# Patient Record
Sex: Male | Born: 1985
Health system: Southern US, Community
[De-identification: ages and names within clinical notes are randomized; demographics above are authoritative.]

## PROBLEM LIST (undated history)

## (undated) DIAGNOSIS — A523 Neurosyphilis, unspecified: Secondary | ICD-10-CM

## (undated) DIAGNOSIS — M545 Low back pain: Secondary | ICD-10-CM

## (undated) DIAGNOSIS — R0789 Other chest pain: Secondary | ICD-10-CM

## (undated) DIAGNOSIS — Z8619 Personal history of other infectious and parasitic diseases: Secondary | ICD-10-CM

## (undated) DIAGNOSIS — Z21 Asymptomatic human immunodeficiency virus [HIV] infection status: Secondary | ICD-10-CM

## (undated) DIAGNOSIS — B20 Human immunodeficiency virus [HIV] disease: Secondary | ICD-10-CM

## (undated) DIAGNOSIS — I10 Essential (primary) hypertension: Secondary | ICD-10-CM

## (undated) DIAGNOSIS — N185 Chronic kidney disease, stage 5: Secondary | ICD-10-CM

## (undated) DIAGNOSIS — K649 Unspecified hemorrhoids: Secondary | ICD-10-CM

## (undated) DIAGNOSIS — J40 Bronchitis, not specified as acute or chronic: Secondary | ICD-10-CM

## (undated) DIAGNOSIS — J45909 Unspecified asthma, uncomplicated: Secondary | ICD-10-CM

## (undated) DIAGNOSIS — Z9119 Patient's noncompliance with other medical treatment and regimen: Secondary | ICD-10-CM

## (undated) HISTORY — DX: Low back pain: M54.5

## (undated) HISTORY — DX: Personal history of other infectious and parasitic diseases: Z86.19

## (undated) HISTORY — DX: Patient's noncompliance with other medical treatment and regimen: Z91.19

## (undated) HISTORY — DX: Other chest pain: R07.89

## (undated) HISTORY — DX: Essential (primary) hypertension: I10

## (undated) HISTORY — DX: Chronic kidney disease, stage 5: N18.5

---
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
## 2009-11-11 ENCOUNTER — Emergency Department (HOSPITAL_COMMUNITY): Admission: EM | Admit: 2009-11-11 | Discharge: 2009-11-11 | Payer: Self-pay | Admitting: Emergency Medicine

## 2011-06-06 ENCOUNTER — Emergency Department (HOSPITAL_COMMUNITY)
Admission: EM | Admit: 2011-06-06 | Discharge: 2011-06-06 | Disposition: A | Discharge status: Home / Self Care | Payer: Self-pay | Attending: Emergency Medicine | Admitting: Emergency Medicine

## 2011-06-06 ENCOUNTER — Emergency Department (HOSPITAL_COMMUNITY): Payer: Self-pay

## 2011-06-06 DIAGNOSIS — S60219A Contusion of unspecified wrist, initial encounter: Secondary | ICD-10-CM | POA: Insufficient documentation

## 2011-06-06 DIAGNOSIS — S1093XA Contusion of unspecified part of neck, initial encounter: Secondary | ICD-10-CM | POA: Insufficient documentation

## 2011-06-06 DIAGNOSIS — M25539 Pain in unspecified wrist: Secondary | ICD-10-CM | POA: Insufficient documentation

## 2011-06-06 DIAGNOSIS — S40021A Contusion of right upper arm, initial encounter: Secondary | ICD-10-CM

## 2011-06-06 DIAGNOSIS — S40029A Contusion of unspecified upper arm, initial encounter: Secondary | ICD-10-CM | POA: Insufficient documentation

## 2011-06-06 DIAGNOSIS — M25519 Pain in unspecified shoulder: Secondary | ICD-10-CM | POA: Insufficient documentation

## 2011-06-06 DIAGNOSIS — M79609 Pain in unspecified limb: Secondary | ICD-10-CM | POA: Insufficient documentation

## 2011-06-06 DIAGNOSIS — R51 Headache: Secondary | ICD-10-CM | POA: Insufficient documentation

## 2011-06-06 DIAGNOSIS — S0003XA Contusion of scalp, initial encounter: Secondary | ICD-10-CM | POA: Insufficient documentation

## 2011-06-06 DIAGNOSIS — F17200 Nicotine dependence, unspecified, uncomplicated: Secondary | ICD-10-CM | POA: Insufficient documentation

## 2011-06-06 DIAGNOSIS — S60211A Contusion of right wrist, initial encounter: Secondary | ICD-10-CM

## 2011-06-06 DIAGNOSIS — W010XXA Fall on same level from slipping, tripping and stumbling without subsequent striking against object, initial encounter: Secondary | ICD-10-CM | POA: Insufficient documentation

## 2011-06-06 MED ORDER — IBUPROFEN 800 MG PO TABS
800.0000 mg | ORAL_TABLET | Freq: Once | ORAL | Status: AC
  Administered 2011-06-06: 800 mg via ORAL
  Filled 2011-06-06: qty 1

## 2011-06-06 NOTE — ED Notes (Signed)
Pt presents with right arm pain left wrist pain and head pain after falling 2 days ago off of stairs.

## 2011-06-06 NOTE — ED Provider Notes (Signed)
History     CSN: [id number]  Arrival date & time 06/06/11  1431   First MD Initiated Contact with Patient 06/06/11 1612      Chief Complaint  Patient presents with  . Arm Pain  . Wrist Pain  . Headache  . Fall    (Consider location/radiation/quality/duration/timing/severity/associated sxs/prior treatment) HPI Comments: Pt slipped and fell down several stairs injuring his R upper arm and wrist.  He also struck the L parietal scalp which hurt for a day but no longer hurting.  No LOC.  Patient is a 26 y.o. male presenting with arm pain, wrist pain, headaches, and fall. The history is provided by the patient.  Arm Pain This is a new problem. Episode onset: 2 days ago. The problem has been unchanged. Associated symptoms include headaches. The symptoms are aggravated by twisting (movement and palpation). He has tried NSAIDs for the symptoms. The treatment provided mild relief.  Wrist Pain Associated symptoms include headaches.  Headache  This is a new problem. The current episode started 2 days ago. The problem has been resolved. The pain is located in the parietal region. The patient is experiencing no pain. The pain does not radiate. He has tried NSAIDs for the symptoms. The treatment provided significant relief.  Fall Associated symptoms include headaches.    History reviewed. No pertinent past medical history.  History reviewed. No pertinent past surgical history.  No family history on file.  History  Substance Use Topics  . Smoking status: Current Everyday Smoker -- 0.5 packs/day  . Smokeless tobacco: Not on file  . Alcohol Use: Yes     occ      Review of Systems  Skin:       Humerus and wrist injuries.  Neurological: Positive for headaches.  All other systems reviewed and are negative.    Allergies  Review of patient's allergies indicates no known allergies.  Home Medications  No current outpatient prescriptions on file.  BP 118/80  Pulse 70  Temp(Src)  98.5 F (36.9 C) (Oral)  Resp 20  Ht 5\' 11"  (1.803 m)  Wt 160 lb (72.576 kg)  BMI 22.32 kg/m2  SpO2 100%  Physical Exam  Nursing note and vitals reviewed. Constitutional: He is oriented to person, place, and time. He appears well-developed and well-nourished. No distress.  HENT:  Head: Normocephalic and atraumatic.  Right Ear: External ear normal.  Left Ear: External ear normal.  Nose: Nose normal.  Eyes: EOM are normal.  Neck: Normal range of motion.  Cardiovascular: Normal rate, regular rhythm, normal heart sounds and intact distal pulses.   Pulmonary/Chest: Effort normal. No respiratory distress.  Abdominal: He exhibits no distension. There is no tenderness.  Musculoskeletal: He exhibits tenderness.       Right shoulder: He exhibits decreased range of motion, tenderness and pain. He exhibits no bony tenderness, no swelling, no effusion, no crepitus, no deformity, no laceration, no spasm, normal pulse and normal strength.       Right elbow: Normal.      Arms:      Pain with movement and palpation of R mid-humeral area and over R ulnar styloid.  Skin intact.  Neurological: He is alert and oriented to person, place, and time.  Skin: Skin is warm and dry. He is not diaphoretic.  Psychiatric: He has a normal mood and affect. Judgment normal.    ED Course  Procedures (including critical care time)  Labs Reviewed - No data to display Dg Wrist Complete Left  06/06/2011  *RADIOLOGY REPORT*  Clinical Data: [place].  Injured left wrist.  LEFT WRIST - COMPLETE 3+ VIEW  Comparison: None  Findings: The joint spaces are maintained.  No acute fracture.  IMPRESSION: No acute bony findings.  Original Report Authenticated By: P. Kalman Jewels, M.D.   Dg Humerus Right  06/06/2011  *RADIOLOGY REPORT*  Clinical Data: [place].  Right arm pain.  RIGHT HUMERUS - 2+ VIEW  Comparison: None  Findings: The shoulder and elbow joints are maintained.  No fracture of the humerus is identified.  IMPRESSION: No  acute bony findings.  Original Report Authenticated By: P. Kalman Jewels, M.D.     No diagnosis found.    [place], [place] 06/06/11 [phone]

## 2011-06-08 NOTE — ED Provider Notes (Signed)
Medical screening examination/treatment/procedure(s) were performed by non-physician practitioner and as supervising physician I was immediately available for consultation/collaboration.  Gypsy Balsam. Olin Hauser, MD 06/08/11 [phone]

## 2011-10-05 ENCOUNTER — Encounter (HOSPITAL_COMMUNITY): Payer: Self-pay | Admitting: *Deleted

## 2011-10-05 ENCOUNTER — Emergency Department (HOSPITAL_COMMUNITY)
Admission: EM | Admit: 2011-10-05 | Discharge: 2011-10-05 | Disposition: A | Discharge status: Home / Self Care | Payer: Self-pay | Attending: Emergency Medicine | Admitting: Emergency Medicine

## 2011-10-05 DIAGNOSIS — K5900 Constipation, unspecified: Secondary | ICD-10-CM | POA: Insufficient documentation

## 2011-10-05 DIAGNOSIS — R109 Unspecified abdominal pain: Secondary | ICD-10-CM | POA: Insufficient documentation

## 2011-10-05 DIAGNOSIS — F17200 Nicotine dependence, unspecified, uncomplicated: Secondary | ICD-10-CM | POA: Insufficient documentation

## 2011-10-05 DIAGNOSIS — A630 Anogenital (venereal) warts: Secondary | ICD-10-CM | POA: Insufficient documentation

## 2011-10-05 MED ORDER — HYDROCODONE-ACETAMINOPHEN 5-325 MG PO TABS: 1.0000 | ORAL_TABLET | Freq: Four times a day (QID) | ORAL | Status: AC | PRN

## 2011-10-05 MED ORDER — DOCUSATE SODIUM 100 MG PO CAPS: 100.0000 mg | ORAL_CAPSULE | Freq: Two times a day (BID) | ORAL | Status: AC

## 2011-10-05 NOTE — ED Notes (Signed)
Patient states he has had constipation since Saturday.  He states he took fiber on Sunday and did have a bm.  He states he is bloated and has rectal pain.  Patient has seen rectal bleeding when he is trying to have a bm

## 2011-10-05 NOTE — ED Provider Notes (Signed)
History     CSN: [id number]  Arrival date & time 10/05/11  1147   First MD Initiated Contact with Patient 10/05/11 1347      Chief Complaint  Patient presents with  . Constipation  . Abdominal Pain     HPI Pt states he has been having trouble with constipation.  He used a laxative over the weekend and had BM on Sunday.  He still feels like he is constipated and took another dose yesterday but it did not work.  He has not had any nausea or vomiting.  He has had persistent pain.  It is in the lower abdomen.  Nothing seems to make it better or worse.  Appetite has not been affected.  Pt does have discomfort in the anus.  . History reviewed. No pertinent past medical history.  History reviewed. No pertinent past surgical history.  No family history on file.  History  Substance Use Topics  . Smoking status: Current Everyday Smoker -- 0.5 packs/day  . Smokeless tobacco: Not on file  . Alcohol Use: Yes     occ      Review of Systems  All other systems reviewed and are negative.    Allergies  Review of patient's allergies indicates no known allergies.  Home Medications  No current outpatient prescriptions on file.  BP 117/80  Pulse 96  Temp(Src) 99 F (37.2 C) (Oral)  Resp 16  Ht $R'5\' 11"'hE$  (1.803 m)  Wt 170 lb (77.111 kg)  BMI 23.71 kg/m2  SpO2 98%  Physical Exam  Nursing note and vitals reviewed. Constitutional: He appears well-developed and well-nourished. No distress.  HENT:  Head: Normocephalic and atraumatic.  Right Ear: External ear normal.  Left Ear: External ear normal.  Eyes: Conjunctivae are normal. Right eye exhibits no discharge. Left eye exhibits no discharge. No scleral icterus.  Neck: Neck supple. No tracheal deviation present.  Cardiovascular: Normal rate, regular rhythm and intact distal pulses.   Pulmonary/Chest: Effort normal and breath sounds normal. No stridor. No respiratory distress. He has no wheezes. He has no rales.  Abdominal: Soft.  Bowel sounds are normal. He exhibits no distension. There is no tenderness. There is no rebound and no guarding.  Genitourinary:       Anal wart with tenderness, no bleeding  Musculoskeletal: He exhibits no edema and no tenderness.  Neurological: He is alert. He has normal strength. No sensory deficit. Cranial nerve deficit:  no gross defecits noted. He exhibits normal muscle tone. He displays no seizure activity. Coordination normal.  Skin: Skin is warm and dry. No rash noted.  Psychiatric: He has a normal mood and affect.    ED Course  Procedures (including critical care time)  Labs Reviewed - No data to display No results found.   1. Anal wart       MDM  I discussed the findings with the patient. He does admit to anal intercourse. I Have given a referral to GI and recommend seeing the health department for possible treatment.        Kathalene Frames, MD 10/05/11 [phone]

## 2011-10-05 NOTE — Discharge Instructions (Signed)
Genital Warts Genital warts are a sexually transmitted infection. They may appear as small bumps on the tissues of the genital area. CAUSES  Genital warts are caused by a virus called human papillomavirus (HPV). HPV is the most common sexually transmitted disease (STD) and infection of the sex organs. This infection is spread by having unprotected sex with an infected person. It can be spread by vaginal, anal, and oral sex. Many people do not know they are infected. They may be infected for years without problems. However, even if they do not have problems, they can unknowingly pass the infection to their sexual partners. SYMPTOMS   Itching and irritation in the genital area.   Warts that bleed.   Painful sexual intercourse.  DIAGNOSIS  Warts are usually recognized with the naked eye on the vagina, vulva, perineum, anus, and rectum. Certain tests can also diagnose genital warts, such as:  A Pap test.   A tissue sample (biopsy) exam.   Colposcopy. A magnifying tool is used to examine the vagina and cervix. The HPV cells will change color when certain solutions are used.  TREATMENT  Warts can be removed by:  Applying certain chemicals, such as cantharidin or podophyllin.   Liquid nitrogen freezing (cryotherapy).   Immunotherapy with candida or trichophyton injections.   Laser treatment.   Burning with an electrified probe (electrocautery).   Interferon injections.   Surgery.  PREVENTION  HPV vaccination can help prevent HPV infections that cause genital warts and that cause cancer of the cervix. It is recommended that the vaccination be given to people between the ages 48 to 72 years old. The vaccine might not work as well or might not work at all if you already have HPV. It should not be given to pregnant women. HOME CARE INSTRUCTIONS   It is important to follow your caregiver's instructions. The warts will not go away without treatment. Repeat treatments are often needed to get  rid of warts. Even after it appears that the warts are gone, the normal tissue underneath often remains infected.   Do not try to treat genital warts with medicine used to treat hand warts. This type of medicine is strong and can burn the skin in the genital area, causing more damage.   Tell your past and current sexual partner(s) that you have genital warts. They may be infected also and need treatment.   Avoid sexual contact while being treated.   Do not touch or scratch the warts. The infection may spread to other parts of your body.   Women with genital warts should have a cervical cancer check (Pap test) at least once a year. This type of cancer is slow-growing and can be cured if found early. Chances of developing cervical cancer are increased with HPV.   Inform your obstetrician about your warts in the event of pregnancy. This virus can be passed to the baby's respiratory tract. Discuss this with your caregiver.   Use a condom during sexual intercourse. Following treatment, the use of condoms will help prevent reinfection.   Ask your caregiver about using over-the-counter anti-itch creams.  SEEK MEDICAL CARE IF:   Your treated skin becomes red, swollen, or painful.   You have a fever.   You feel generally ill.   You feel little lumps in and around your genital area.   You are bleeding or have painful sexual intercourse.  MAKE SURE YOU:   Understand these instructions.   Will watch your condition.   Will  get help right away if you are not doing well or get worse.  Document Released: 05/14/2000 Document Revised: 05/06/2011 Document Reviewed: 11/23/2010 [facility] Patient Information 2012 [place]. RESOURCE GUIDE  Dental Problems  Patients with Medicaid: [facility] [place]                     [id number]                                           Phone:  [phone]                                                  If unable to pay or uninsured,  contact:  [facility] or [facility]. to become qualified for the adult dental clinic.  Chronic Pain Problems Contact [facility]  [phone] Patients need to be referred by their primary care doctor.  Insufficient Money for Medicine Contact [employer]:  call "211" or [place] [phone].  No Primary Care Doctor Call [facility]  [phone] Other agencies that provide inexpensive medical care    [place]  [phone]    [facility]  [place]  [phone]    [facility]  [phone]    [facility]  [phone]    [facility]  [phone]  Substance Abuse Resources Alcohol and Drug Services  [place] [phone] [facility] [phone] Residential & Outpatient Substance Abuse Program  [place]  [phone] [place]   [phone] (emergency services [phone])  Abuse/Neglect [place] [phone] [place] [phone] (After Hours)  Emergency [place] [phone]  [place] at the [place] [phone] [place] [phone]  MRSA Hotline #:   539-758-5952    [facility] Dept. [address]         [place] Phone:  [phone]                                  Phone:  [phone]  Phone:  [place] Phone:  [place] [phone] (After Hours)

## 2011-10-05 NOTE — ED Notes (Signed)
Pt was received to [facility] with c/o abdominal pain onset 4 days on and off characterized as cramping. Pt took a fiber packet yesterday and had a small bowel movement. Pt denies any N/V, no fever.

## 2013-02-08 ENCOUNTER — Encounter (HOSPITAL_COMMUNITY): Payer: Self-pay | Admitting: Emergency Medicine

## 2013-02-08 ENCOUNTER — Emergency Department (HOSPITAL_COMMUNITY)
Admission: EM | Admit: 2013-02-08 | Discharge: 2013-02-08 | Disposition: A | Discharge status: Home / Self Care | Payer: Self-pay | Attending: Emergency Medicine | Admitting: Emergency Medicine

## 2013-02-08 DIAGNOSIS — K644 Residual hemorrhoidal skin tags: Secondary | ICD-10-CM | POA: Insufficient documentation

## 2013-02-08 DIAGNOSIS — R197 Diarrhea, unspecified: Secondary | ICD-10-CM | POA: Insufficient documentation

## 2013-02-08 DIAGNOSIS — F17200 Nicotine dependence, unspecified, uncomplicated: Secondary | ICD-10-CM | POA: Insufficient documentation

## 2013-02-08 HISTORY — DX: Unspecified hemorrhoids: K64.9

## 2013-02-08 MED ORDER — HYDROCORTISONE 2.5 % RE CREA: TOPICAL_CREAM | RECTAL | Status: DC

## 2013-02-08 NOTE — ED Provider Notes (Signed)
Medical screening examination/treatment/procedure(s) were performed by non-physician practitioner and as supervising physician I was immediately available for consultation/collaboration.   Neta Ehlers, MD 02/08/13 2017

## 2013-02-08 NOTE — ED Provider Notes (Signed)
CSN: [id number]     Arrival date & time 02/08/13  8250 History   First MD Initiated Contact with Patient 02/08/13 [phone]     Chief Complaint  Patient presents with  . Rectal Pain   (Consider location/radiation/quality/duration/timing/severity/associated sxs/prior Treatment) The history is provided by the patient and medical records.   Pt presents to the ED via EMS for rectal pain since this morning.  Pt states he has had some non-bloody diarrhea since yesterday-- thinks he may have eaten some bad food.  Pt states he was feeling around his rectal area this morning and felt a "knot" which he thinks is an abscess.  Denies any drainage or active bleeding from the rectal area.  Pt does have a hx of non-thrombosed external hemorrhoids.  No recent fevers, sweats, or chills.  No abdominal pain, nausea, or vomiting.   Past Medical History  Diagnosis Date  . Hemorrhoids    No past surgical history on file. No family history on file. History  Substance Use Topics  . Smoking status: Current Every Day Smoker -- 0.20 packs/day  . Smokeless tobacco: Not on file  . Alcohol Use: Yes     Comment: occ    Review of Systems  Genitourinary:       Rectal pain  All other systems reviewed and are negative.    Allergies  Review of patient's allergies indicates no known allergies.  Home Medications  No current outpatient prescriptions on file. BP 125/72  Pulse 90  Temp(Src) 98.4 F (36.9 C) (Oral)  Resp 20  SpO2 94%  Physical Exam  Nursing note and vitals reviewed. Constitutional: He is oriented to person, place, and time. He appears well-developed and well-nourished. No distress.  HENT:  Head: Normocephalic and atraumatic.  Eyes: Conjunctivae and EOM are normal.  Neck: Normal range of motion. Neck supple.  Cardiovascular: Normal rate, regular rhythm and normal heart sounds.   Pulmonary/Chest: Effort normal and breath sounds normal. No respiratory distress. He has no wheezes.  Abdominal:  Soft. Bowel sounds are normal. There is no tenderness. There is no guarding.  Genitourinary: Rectal exam shows external hemorrhoid.  2 small, non-thrombosed, non-bleeding, external hemorrhoids; no surrounding erythema, swelling, induration, or abscess formation  Musculoskeletal: Normal range of motion.  Neurological: He is alert and oriented to person, place, and time.  Skin: Skin is warm and dry. He is not diaphoretic.  Psychiatric: He has a normal mood and affect.    ED Course  Procedures (including critical care time) Labs Review Labs Reviewed - No data to display Imaging Review No results found.  MDM   1. External hemorrhoids without mention of complication    Non-thrombosed, non-bleeding external hemorrhoids. No signs of peri-rectal abscess formation.  Rx anusol.  Discussed plan with pt, they agreed.  Return precautions advised.  Larene Pickett, PA-C 02/08/13 [phone]

## 2013-02-08 NOTE — ED Notes (Addendum)
Per EMS patient reports with rectal pain, patient is ambulating, c/o loose bowel movements. Per EMS patient states it "feels like an abscess," pain worsens with movement, no blood present. Patient states that he feels a mass around his anus.

## 2013-05-17 ENCOUNTER — Emergency Department (HOSPITAL_COMMUNITY): Payer: Self-pay

## 2013-05-17 ENCOUNTER — Encounter (HOSPITAL_COMMUNITY): Payer: Self-pay | Admitting: Emergency Medicine

## 2013-05-17 ENCOUNTER — Emergency Department (HOSPITAL_COMMUNITY)
Admission: EM | Admit: 2013-05-17 | Discharge: 2013-05-17 | Disposition: A | Discharge status: Home / Self Care | Payer: Self-pay | Attending: Emergency Medicine | Admitting: Emergency Medicine

## 2013-05-17 DIAGNOSIS — R509 Fever, unspecified: Secondary | ICD-10-CM | POA: Insufficient documentation

## 2013-05-17 DIAGNOSIS — R000 Tachycardia, unspecified: Secondary | ICD-10-CM | POA: Insufficient documentation

## 2013-05-17 DIAGNOSIS — R42 Dizziness and giddiness: Secondary | ICD-10-CM | POA: Insufficient documentation

## 2013-05-17 DIAGNOSIS — J40 Bronchitis, not specified as acute or chronic: Secondary | ICD-10-CM

## 2013-05-17 DIAGNOSIS — R062 Wheezing: Secondary | ICD-10-CM | POA: Insufficient documentation

## 2013-05-17 DIAGNOSIS — Z8709 Personal history of other diseases of the respiratory system: Secondary | ICD-10-CM | POA: Insufficient documentation

## 2013-05-17 DIAGNOSIS — R0602 Shortness of breath: Secondary | ICD-10-CM | POA: Insufficient documentation

## 2013-05-17 DIAGNOSIS — R0682 Tachypnea, not elsewhere classified: Secondary | ICD-10-CM | POA: Insufficient documentation

## 2013-05-17 DIAGNOSIS — Z8719 Personal history of other diseases of the digestive system: Secondary | ICD-10-CM | POA: Insufficient documentation

## 2013-05-17 DIAGNOSIS — J209 Acute bronchitis, unspecified: Secondary | ICD-10-CM | POA: Insufficient documentation

## 2013-05-17 DIAGNOSIS — R9431 Abnormal electrocardiogram [ECG] [EKG]: Secondary | ICD-10-CM | POA: Insufficient documentation

## 2013-05-17 DIAGNOSIS — M6281 Muscle weakness (generalized): Secondary | ICD-10-CM | POA: Insufficient documentation

## 2013-05-17 DIAGNOSIS — F17200 Nicotine dependence, unspecified, uncomplicated: Secondary | ICD-10-CM | POA: Insufficient documentation

## 2013-05-17 LAB — BASIC METABOLIC PANEL
CO2: 20 mEq/L (ref 19–32)
Chloride: 99 mEq/L (ref 96–112)
GFR calc non Af Amer: 73 mL/min — ABNORMAL LOW (ref >90)

## 2013-05-17 LAB — CBC
MCHC: 35.5 g/dL (ref 30.0–36.0)
MCV: 90.8 fL (ref 78.0–100.0)
Platelets: 238 10*3/uL (ref 150–400)
RBC: 5.77 MIL/uL (ref 4.22–5.81)
RDW: 14.7 % (ref 11.5–15.5)
WBC: 11.5 10*3/uL — ABNORMAL HIGH (ref 4.0–10.5)

## 2013-05-17 LAB — POCT I-STAT TROPONIN I: Troponin i, poc: 0.01 ng/mL (ref 0.00–0.08)

## 2013-05-17 MED ORDER — IBUPROFEN 200 MG PO TABS: 600.0000 mg | ORAL_TABLET | Freq: Once | ORAL | Status: DC

## 2013-05-17 MED ORDER — PREDNISONE 20 MG PO TABS
60.0000 mg | ORAL_TABLET | Freq: Once | ORAL | Status: AC
  Administered 2013-05-17: 60 mg via ORAL
  Filled 2013-05-17: qty 3

## 2013-05-17 MED ORDER — AZITHROMYCIN 250 MG PO TABS: 250.0000 mg | ORAL_TABLET | Freq: Every day | ORAL | Status: DC

## 2013-05-17 MED ORDER — AZITHROMYCIN 250 MG PO TABS
500.0000 mg | ORAL_TABLET | Freq: Once | ORAL | Status: AC
  Administered 2013-05-17: 500 mg via ORAL
  Filled 2013-05-17: qty 2

## 2013-05-17 MED ORDER — ALBUTEROL SULFATE HFA 108 (90 BASE) MCG/ACT IN AERS
1.0000 | INHALATION_SPRAY | Freq: Once | RESPIRATORY_TRACT | Status: AC
  Administered 2013-05-17: 2 via RESPIRATORY_TRACT
  Filled 2013-05-17: qty 6.7

## 2013-05-17 MED ORDER — PREDNISONE 50 MG PO TABS: 50.0000 mg | ORAL_TABLET | Freq: Every day | ORAL | Status: DC

## 2013-05-17 NOTE — ED Notes (Signed)
Pt states productive cough with white yellow phlegm, and substernal non radiating CP, causing SOB, chills, and diarrhea.

## 2013-05-17 NOTE — ED Notes (Signed)
Patient transported to X-ray 

## 2013-05-17 NOTE — ED Provider Notes (Signed)
CSN: [id number]     Arrival date & time 05/17/13  0120 History   First MD Initiated Contact with Patient 05/17/13 0244     Chief Complaint  Patient presents with  . Chest Pain   (Consider location/radiation/quality/duration/timing/severity/associated sxs/prior Treatment) HPI Comments: PT comes in with cc of cough, fevers, chest pain and chills. Pt started having sx y'day, and they have progressed overtime. Cough is producing yellow phlegm. He has midsternal chest pain, non radiating, which is worse with cough. The chest pain is not present at rest, and is not exertional. Pt has no nausea, emesis, no sick contacts and he is immunocompetent. Pt denies any myalgias. No runny nose, teary eyes, sore throat.  Patient is a 27 y.o. male presenting with chest pain. The history is provided by the patient and a friend.  Chest Pain Associated symptoms: cough, dizziness, fever, shortness of breath and weakness   Associated symptoms: no headache, no nausea and not vomiting     Past Medical History  Diagnosis Date  . Hemorrhoids    No past surgical history on file. No family history on file. History  Substance Use Topics  . Smoking status: Current Every Day Smoker -- 0.20 packs/day  . Smokeless tobacco: Not on file  . Alcohol Use: Yes     Comment: occ    Review of Systems  Constitutional: Positive for fever and chills. Negative for activity change.  Eyes: Negative for visual disturbance.  Respiratory: Positive for cough, shortness of breath and wheezing. Negative for chest tightness.   Cardiovascular: Positive for chest pain.  Gastrointestinal: Negative for nausea, vomiting and abdominal distention.  Genitourinary: Negative for dysuria, enuresis and difficulty urinating.  Musculoskeletal: Negative for arthralgias and neck pain.  Skin: Negative for rash.  Neurological: Positive for dizziness and weakness. Negative for light-headedness and headaches.  Hematological: Does not bruise/bleed  easily.  Psychiatric/Behavioral: Negative for confusion.    Allergies  Review of patient's allergies indicates no known allergies.  Home Medications   Current Outpatient Rx  Name  Route  Sig  Dispense  Refill  . azithromycin (ZITHROMAX) 250 MG tablet   Oral   Take 1 tablet (250 mg total) by mouth daily. 1 tab every day starting Thursday night   4 tablet   0   . predniSONE (DELTASONE) 50 MG tablet   Oral   Take 1 tablet (50 mg total) by mouth daily.   5 tablet   0    BP 120/63  Pulse 91  Temp(Src) 100.2 F (37.9 C) (Oral)  Resp 20  Ht $R'5\' 11"'ch$  (1.803 m)  Wt 179 lb (81.194 kg)  BMI 24.98 kg/m2  SpO2 94% Physical Exam  Nursing note and vitals reviewed. Constitutional: He is oriented to person, place, and time. He appears well-developed.  HENT:  Head: Normocephalic and atraumatic.  Eyes: Conjunctivae and EOM are normal. Pupils are equal, round, and reactive to light.  Neck: Normal range of motion. Neck supple.  Cardiovascular: Normal rate and regular rhythm.   Pulmonary/Chest: Effort normal. He has wheezes.  Right sided wheez and rhonchi  Abdominal: Soft. Bowel sounds are normal. He exhibits no distension. There is no tenderness. There is no rebound and no guarding.  Neurological: He is alert and oriented to person, place, and time.  Skin: Skin is warm. No rash noted.    ED Course  Procedures (including critical care time) Labs Review Labs Reviewed  CBC - Abnormal; Notable for the following:    WBC 11.5 (*)  Hemoglobin 18.6 (*)    HCT 52.4 (*)    All other components within normal limits  BASIC METABOLIC PANEL - Abnormal; Notable for the following:    Sodium 133 (*)    Glucose, Bld 126 (*)    GFR calc non Af Amer 73 (*)    GFR calc Af Amer 84 (*)    All other components within normal limits  POCT I-STAT TROPONIN I   Imaging Review Dg Chest 2 View  05/17/2013   CLINICAL DATA:  Cough, shortness of breath, fever  EXAM: CHEST  2 VIEW  COMPARISON:  None.   FINDINGS: The cardiac and mediastinal silhouettes are within normal limits.  The lungs are normally inflated. No airspace consolidation, pleural effusion, or pulmonary edema is identified. There is no pneumothorax. Mild diffuse prominence of the bronchiolar structures is present, suggestive of bronchiolitis.  No acute osseous abnormality identified.  IMPRESSION: Mild diffuse bronchitic changes, suggestive of bronchiolitis. No focal infiltrate to suggest bacterial pneumonia identified.   Electronically Signed   By: Jeannine Boga M.D.   On: 05/17/2013 01:50    EKG Interpretation    Date/Time:  Thursday May 17 2013 01:27:45 EST Ventricular Rate:  111 PR Interval:  144 QRS Duration: 94 QT Interval:  302 QTC Calculation: 410 R Axis:   -168 Text Interpretation:  Sinus tachycardia Biatrial enlargement Right superior axis deviation Pulmonary disease pattern Abnormal ECG Confirmed by Angelique Chevalier, MD, Earla Charlie ([id number]) on 05/17/2013 5:21:26 AM            MDM   1. Bronchitis    Pt comes in with cc of cough, chills, fevers, chest pain, dib. Troponin is negative. Pt is tachycardic, febrile, tachypneic - his O2 saturations are at 96% on room air for me. Lung exam did show some right sided consolidation, however, Xrays how no definite infiltrate. Possible flu, possible CAP that is missed on Xray or just bronchitis. If CAP - curb65 is 0 - and he is appropriate for outpatient tx. EKG is stable, troponin is neg, likely not myocarditis. If flu - immunocompetent - no need to get flu swabs right now. No myalgias, or URI like sx. We have given him and his friend good return precaution - and they are to return to the Er if the symptoms are not improving, since they don't have a pcp nor di they have insurance. We will give the albuterol inhaler from the ED.  Smoking cessation instruction/counseling given:  counseled patient on the dangers of tobacco use, advised patient to stop smoking, and reviewed  strategies to maximize success. Discussion was for 2-3 minutes     Varney Biles, MD 05/17/13 0532

## 2013-05-19 ENCOUNTER — Emergency Department (HOSPITAL_COMMUNITY)
Admission: EM | Admit: 2013-05-19 | Discharge: 2013-05-19 | Disposition: A | Discharge status: Home / Self Care | Payer: Self-pay | Attending: Emergency Medicine | Admitting: Emergency Medicine

## 2013-05-19 ENCOUNTER — Encounter (HOSPITAL_COMMUNITY): Payer: Self-pay | Admitting: Emergency Medicine

## 2013-05-19 ENCOUNTER — Emergency Department (HOSPITAL_COMMUNITY): Payer: Self-pay

## 2013-05-19 DIAGNOSIS — IMO0002 Reserved for concepts with insufficient information to code with codable children: Secondary | ICD-10-CM | POA: Insufficient documentation

## 2013-05-19 DIAGNOSIS — F17200 Nicotine dependence, unspecified, uncomplicated: Secondary | ICD-10-CM | POA: Insufficient documentation

## 2013-05-19 DIAGNOSIS — M549 Dorsalgia, unspecified: Secondary | ICD-10-CM | POA: Insufficient documentation

## 2013-05-19 DIAGNOSIS — J209 Acute bronchitis, unspecified: Secondary | ICD-10-CM | POA: Insufficient documentation

## 2013-05-19 DIAGNOSIS — J40 Bronchitis, not specified as acute or chronic: Secondary | ICD-10-CM

## 2013-05-19 DIAGNOSIS — Z8679 Personal history of other diseases of the circulatory system: Secondary | ICD-10-CM | POA: Insufficient documentation

## 2013-05-19 HISTORY — DX: Bronchitis, not specified as acute or chronic: J40

## 2013-05-19 LAB — COMPREHENSIVE METABOLIC PANEL
ALT: 23 U/L (ref 0–53)
AST: 39 U/L — ABNORMAL HIGH (ref 0–37)
Albumin: 3.1 g/dL — ABNORMAL LOW (ref 3.5–5.2)
Alkaline Phosphatase: 63 U/L (ref 39–117)
BUN: 14 mg/dL (ref 6–23)
Calcium: 8.4 mg/dL (ref 8.4–10.5)
Chloride: 101 mEq/L (ref 96–112)
Potassium: 3.8 mEq/L (ref 3.5–5.1)
Sodium: 134 mEq/L — ABNORMAL LOW (ref 135–145)
Total Bilirubin: 1.1 mg/dL (ref 0.3–1.2)
Total Protein: 7.8 g/dL (ref 6.0–8.3)

## 2013-05-19 LAB — CBC WITH DIFFERENTIAL/PLATELET
Basophils Absolute: 0.1 10*3/uL (ref 0.0–0.1)
Basophils Relative: 1 % (ref 0–1)
Eosinophils Absolute: 0 10*3/uL (ref 0.0–0.7)
Hemoglobin: 16.7 g/dL (ref 13.0–17.0)
Lymphocytes Relative: 50 % — ABNORMAL HIGH (ref 12–46)
MCH: 30.9 pg (ref 26.0–34.0)
MCHC: 34.5 g/dL (ref 30.0–36.0)
Monocytes Relative: 12 % (ref 3–12)
Neutrophils Relative %: 37 % — ABNORMAL LOW (ref 43–77)
Platelets: 206 10*3/uL (ref 150–400)
RDW: 14.1 % (ref 11.5–15.5)
WBC: 6.8 10*3/uL (ref 4.0–10.5)

## 2013-05-19 MED ORDER — OXYCODONE-ACETAMINOPHEN 5-325 MG PO TABS
1.0000 | ORAL_TABLET | Freq: Once | ORAL | Status: AC
  Administered 2013-05-19: 1 via ORAL
  Filled 2013-05-19: qty 1

## 2013-05-19 MED ORDER — DEXTROSE 5 % IV SOLN
500.0000 mg | Freq: Once | INTRAVENOUS | Status: AC
  Administered 2013-05-19: 500 mg via INTRAVENOUS

## 2013-05-19 MED ORDER — ALBUTEROL SULFATE (5 MG/ML) 0.5% IN NEBU
5.0000 mg | INHALATION_SOLUTION | Freq: Once | RESPIRATORY_TRACT | Status: AC
  Administered 2013-05-19: 5 mg via RESPIRATORY_TRACT
  Filled 2013-05-19: qty 1

## 2013-05-19 MED ORDER — ALBUTEROL SULFATE HFA 108 (90 BASE) MCG/ACT IN AERS: 1.0000 | INHALATION_SPRAY | Freq: Four times a day (QID) | RESPIRATORY_TRACT | Status: DC | PRN

## 2013-05-19 MED ORDER — DEXTROSE 5 % IV SOLN
1.0000 g | Freq: Once | INTRAVENOUS | Status: AC
  Administered 2013-05-19: 1 g via INTRAVENOUS
  Filled 2013-05-19: qty 10

## 2013-05-19 NOTE — ED Notes (Signed)
Patient's O2 Sat remained at 97% while ambulating down hallway.

## 2013-05-19 NOTE — ED Notes (Signed)
Pt. reports persistent dry cough with mid back pain and chest congestion  onset today , seen here 2 days ago diagnosed with Bronchitis , pt. did not fill prescription . No fever or chills.

## 2013-05-19 NOTE — ED Provider Notes (Signed)
CSN: [id number]     Arrival date & time 05/19/13  0335 History   First MD Initiated Contact with Patient 05/19/13 [phone]     Chief Complaint  Patient presents with  . Cough  . Back Pain   (Consider location/radiation/quality/duration/timing/severity/associated sxs/prior Treatment) HPI This patient is a generally healthy 27 year old man who was seen in this emergency department 2 days ago for productive cough. He had a chest x-ray which was unremarkable, he was diagnosed with bronchitis and prescribed azithromycin. He has not had prescription filled.  He is brought back to the emergency department by a friend because he feels like he is still feeling poorly and needs an extended note to excuse him from work. He has not had his antibiotics filled. He says he just got paid yesterday and will get the antibiotic filled today.  He denies fever. Continues to have a productive cough. He feels generally weak. His appetite and by mouth intake has been mildly diminished. However, he can tolerate fluids. She has some aching bilateral back pain which is worse after coughing. He denies chest pain.  Past Medical History  Diagnosis Date  . Hemorrhoids   . Bronchitis    History reviewed. No pertinent past surgical history. No family history on file. History  Substance Use Topics  . Smoking status: Current Every Day Smoker -- 0.20 packs/day  . Smokeless tobacco: Not on file  . Alcohol Use: Yes     Comment: occ    Review of Systems 10 point review of systems obtained and is negative with the exception of symptoms noted above.   Allergies  Review of patient's allergies indicates no known allergies.  Home Medications   Current Outpatient Rx  Name  Route  Sig  Dispense  Refill  . guaiFENesin (ROBITUSSIN) 100 MG/5ML liquid   Oral   Take 200 mg by mouth 3 (three) times daily as needed for cough.         Marland Kitchen azithromycin (ZITHROMAX) 250 MG tablet   Oral   Take 1 tablet (250 mg total) by mouth  daily. 1 tab every day starting Thursday night   4 tablet   0   . predniSONE (DELTASONE) 50 MG tablet   Oral   Take 1 tablet (50 mg total) by mouth daily.   5 tablet   0    BP 104/77  Pulse 83  Temp(Src) 98.3 F (36.8 C) (Oral)  Resp 20  SpO2 97% Physical Exam Gen: well developed and well nourished appearing Head: NCAT Eyes: PERL, EOMI Nose: no epistaixis or rhinorrhea Mouth/throat: mucosa is moist and pink Neck: supple, no stridor Lungs: CTA B, good air exchange, RR 20/min, scattered bilateral wheezing, rhonchi or rales. No accessory muscle use or retractions.  CV: RRR, no murmur, good extremity pulses  Abd: soft, notender, nondistended Back: no midline ttp Skin: warm and dry, no lesions Neuro: CN ii-xii grossly intact, no focal deficits, normal speech, normal gait.  Psyche; normal affect,  calm and cooperative.   ED Course  Procedures (including critical care time) Labs Review Results for orders placed during the hospital encounter of 05/19/13 (from the past 24 hour(s))  CBC WITH DIFFERENTIAL     Status: None   Collection Time    05/19/13  7:05 AM      Result Value Range   WBC 6.8  4.0 - 10.5 K/uL   RBC 5.40  4.22 - 5.81 MIL/uL   Hemoglobin 16.7  13.0 - 17.0 g/dL   HCT 48.4  39.0 - 52.0 %   MCV 89.6  78.0 - 100.0 fL   MCH 30.9  26.0 - 34.0 pg   MCHC 34.5  30.0 - 36.0 g/dL   RDW 14.1  11.5 - 15.5 %   Platelets 206  150 - 400 K/uL   Neutrophils Relative % PENDING  43 - 77 %   Neutro Abs PENDING  1.7 - 7.7 K/uL   Band Neutrophils PENDING  0 - 10 %   Lymphocytes Relative PENDING  12 - 46 %   Lymphs Abs PENDING  0.7 - 4.0 K/uL   Monocytes Relative PENDING  3 - 12 %   Monocytes Absolute PENDING  0.1 - 1.0 K/uL   Eosinophils Relative PENDING  0 - 5 %   Eosinophils Absolute PENDING  0.0 - 0.7 K/uL   Basophils Relative PENDING  0 - 1 %   Basophils Absolute PENDING  0.0 - 0.1 K/uL   WBC Morphology PENDING     RBC Morphology PENDING     Smear Review PENDING      nRBC PENDING  0 /100 WBC   Metamyelocytes Relative PENDING     Myelocytes PENDING     Promyelocytes Absolute PENDING     Blasts PENDING    COMPREHENSIVE METABOLIC PANEL     Status: Abnormal   Collection Time    05/19/13  7:05 AM      Result Value Range   Sodium 134 (*) 135 - 145 mEq/L   Potassium 3.8  3.5 - 5.1 mEq/L   Chloride 101  96 - 112 mEq/L   CO2 25  19 - 32 mEq/L   Glucose, Bld 89  70 - 99 mg/dL   BUN 14  6 - 23 mg/dL   Creatinine, Ser 1.11  0.50 - 1.35 mg/dL   Calcium 8.4  8.4 - 10.5 mg/dL   Total Protein 7.8  6.0 - 8.3 g/dL   Albumin 3.1 (*) 3.5 - 5.2 g/dL   AST 39 (*) 0 - 37 U/L   ALT 23  0 - 53 U/L   Alkaline Phosphatase 63  39 - 117 U/L   Total Bilirubin 1.1  0.3 - 1.2 mg/dL   GFR calc non Af Amer >90  >90 mL/min   GFR calc Af Amer >90  >90 mL/min    Imaging Review Dg Chest 2 View  05/19/2013   CLINICAL DATA:  Cough and back pain  EXAM: CHEST  2 VIEW  COMPARISON:  05/17/2013  FINDINGS: Normal heart size and mediastinal contours. There is dextroscoliosis of the thoracic spine. No effusion or pneumothorax. Mild diffuse bronchial thickening. No new opacity to suggest pneumonia.  IMPRESSION: 1. Stable exam. There bronchitic changes but no definitive pneumonia. 2. Thoracic dextroscoliosis.   Electronically Signed   By: Jorje Guild M.D.   On: 05/19/2013 06:02     MDM    Patient treated with Albuterol neb in ED. Looks and feels better. Ambulatory sats wnl. VS wnl. Stable for discharge with plan to fill prescriptions today. Referred to [facility] for outpatient f/u.   Elyn Peers, MD 05/19/13 [phone]

## 2013-05-19 NOTE — ED Notes (Signed)
Pt. States, "here on Thursday; prescribed tessalon pearls and ineffective; felt light headed at work, almost wanting to pass out."

## 2013-05-25 LAB — CULTURE, BLOOD (ROUTINE X 2): Culture: NO GROWTH

## 2013-11-16 ENCOUNTER — Emergency Department (HOSPITAL_COMMUNITY): Payer: Self-pay

## 2013-11-16 ENCOUNTER — Encounter (HOSPITAL_COMMUNITY): Payer: Self-pay | Admitting: Emergency Medicine

## 2013-11-16 ENCOUNTER — Emergency Department (HOSPITAL_COMMUNITY)
Admission: EM | Admit: 2013-11-16 | Discharge: 2013-11-16 | Disposition: A | Discharge status: Home / Self Care | Payer: Self-pay | Attending: Emergency Medicine | Admitting: Emergency Medicine

## 2013-11-16 DIAGNOSIS — Z8679 Personal history of other diseases of the circulatory system: Secondary | ICD-10-CM | POA: Insufficient documentation

## 2013-11-16 DIAGNOSIS — Z8709 Personal history of other diseases of the respiratory system: Secondary | ICD-10-CM | POA: Insufficient documentation

## 2013-11-16 DIAGNOSIS — K5289 Other specified noninfective gastroenteritis and colitis: Secondary | ICD-10-CM | POA: Insufficient documentation

## 2013-11-16 DIAGNOSIS — F17200 Nicotine dependence, unspecified, uncomplicated: Secondary | ICD-10-CM | POA: Insufficient documentation

## 2013-11-16 DIAGNOSIS — K529 Noninfective gastroenteritis and colitis, unspecified: Secondary | ICD-10-CM

## 2013-11-16 DIAGNOSIS — N390 Urinary tract infection, site not specified: Secondary | ICD-10-CM | POA: Insufficient documentation

## 2013-11-16 LAB — COMPREHENSIVE METABOLIC PANEL
ALK PHOS: 76 U/L (ref 39–117)
ALT: 29 U/L (ref 0–53)
AST: 30 U/L (ref 0–37)
Albumin: 3.1 g/dL — ABNORMAL LOW (ref 3.5–5.2)
BILIRUBIN TOTAL: 0.8 mg/dL (ref 0.3–1.2)
BUN: 10 mg/dL (ref 6–23)
CHLORIDE: 105 meq/L (ref 96–112)
CO2: 24 mEq/L (ref 19–32)
Calcium: 8.8 mg/dL (ref 8.4–10.5)
Creatinine, Ser: 1.05 mg/dL (ref 0.50–1.35)
GFR calc Af Amer: >90 mL/min (ref >90)
GFR calc non Af Amer: >90 mL/min (ref >90)
Glucose, Bld: 92 mg/dL (ref 70–99)
POTASSIUM: 3.7 meq/L (ref 3.7–5.3)
SODIUM: 140 meq/L (ref 137–147)
Total Protein: 7.9 g/dL (ref 6.0–8.3)

## 2013-11-16 LAB — URINALYSIS, ROUTINE W REFLEX MICROSCOPIC
BILIRUBIN URINE: NEGATIVE
GLUCOSE, UA: NEGATIVE mg/dL
Hgb urine dipstick: NEGATIVE
KETONES UR: 15 mg/dL — AB
Nitrite: NEGATIVE
Protein, ur: 30 mg/dL — AB
SPECIFIC GRAVITY, URINE: 1.033 — AB (ref 1.005–1.030)
Urobilinogen, UA: 1 mg/dL (ref 0.0–1.0)
pH: 7 (ref 5.0–8.0)

## 2013-11-16 LAB — CBC WITH DIFFERENTIAL/PLATELET
BASOS ABS: 0.1 10*3/uL (ref 0.0–0.1)
BASOS PCT: 1 % (ref 0–1)
EOS ABS: 0.5 10*3/uL (ref 0.0–0.7)
Eosinophils Relative: 9 % — ABNORMAL HIGH (ref 0–5)
HCT: 47.8 % (ref 39.0–52.0)
HEMOGLOBIN: 16.5 g/dL (ref 13.0–17.0)
LYMPHS PCT: 38 % (ref 12–46)
Lymphs Abs: 2.1 10*3/uL (ref 0.7–4.0)
MCH: 30.6 pg (ref 26.0–34.0)
MCHC: 34.5 g/dL (ref 30.0–36.0)
MCV: 88.5 fL (ref 78.0–100.0)
MONOS PCT: 10 % (ref 3–12)
Monocytes Absolute: 0.6 10*3/uL (ref 0.1–1.0)
NEUTROS ABS: 2.3 10*3/uL (ref 1.7–7.7)
NEUTROS PCT: 42 % — AB (ref 43–77)
Platelets: 272 10*3/uL (ref 150–400)
RBC: 5.4 MIL/uL (ref 4.22–5.81)
RDW: 14 % (ref 11.5–15.5)
WBC: 5.6 10*3/uL (ref 4.0–10.5)

## 2013-11-16 LAB — URINE MICROSCOPIC-ADD ON

## 2013-11-16 LAB — LIPASE, BLOOD: Lipase: 22 U/L (ref 11–59)

## 2013-11-16 MED ORDER — ONDANSETRON HCL 4 MG/2ML IJ SOLN
4.0000 mg | Freq: Once | INTRAMUSCULAR | Status: AC
  Administered 2013-11-16: 4 mg via INTRAVENOUS
  Filled 2013-11-16: qty 2

## 2013-11-16 MED ORDER — ONDANSETRON HCL 4 MG PO TABS: 4.0000 mg | ORAL_TABLET | Freq: Four times a day (QID) | ORAL | Status: DC

## 2013-11-16 MED ORDER — IOHEXOL 300 MG/ML  SOLN
100.0000 mL | Freq: Once | INTRAMUSCULAR | Status: AC | PRN
  Administered 2013-11-16: 100 mL via INTRAVENOUS

## 2013-11-16 MED ORDER — SODIUM CHLORIDE 0.9 % IV BOLUS (SEPSIS)
1000.0000 mL | Freq: Once | INTRAVENOUS | Status: AC
  Administered 2013-11-16: 1000 mL via INTRAVENOUS

## 2013-11-16 MED ORDER — CEPHALEXIN 500 MG PO CAPS: 500.0000 mg | ORAL_CAPSULE | Freq: Two times a day (BID) | ORAL | Status: DC

## 2013-11-16 MED ORDER — CEFTRIAXONE SODIUM 1 G IJ SOLR
1.0000 g | Freq: Once | INTRAMUSCULAR | Status: AC
  Administered 2013-11-16: 1 g via INTRAVENOUS
  Filled 2013-11-16: qty 10

## 2013-11-16 NOTE — ED Notes (Signed)
Notified PT that we would have to perform in & out cath if he couldn't produce a urine sample per MD orders

## 2013-11-16 NOTE — ED Notes (Signed)
Pt. Stated, I can't put a pain number on my head hurting

## 2013-11-16 NOTE — ED Notes (Signed)
PT states that every time he eats something he gets diarrhea which has been happening since this past Sunday. PT states diarrhea looks "weird" -green to light brown. PT states he has felt nauseous from smells a few times this week. Denies vomiting, fever, body aches

## 2013-11-16 NOTE — ED Notes (Signed)
Pt was playing a game and on his tablet .

## 2013-11-16 NOTE — ED Notes (Signed)
PT finished contrast. Called CT to let them know

## 2013-11-16 NOTE — ED Provider Notes (Signed)
CSN: [id number]     Arrival date & time 11/16/13  1239 History   First MD Initiated Contact with Patient 11/16/13 1313     Chief Complaint  Patient presents with  . Abdominal Pain     (Consider location/radiation/quality/duration/timing/severity/associated sxs/prior Treatment) The history is provided by the patient. No language interpreter was used.  Jonathon Moore is a 28 year-old male with past medical history of hemorrhoids and bronchitis presenting to the ED with abdominal pain that started on Sunday. As per patient, reported the abdominal pain is localized to the lower quadrants described as a cramping, aching sensation without radiation. Stated he's been having diarrhea intermittently since Sunday described as a loose stool with a brownish greenish tinge. Stated that he's been having nausea without episodes of emesis. Stated that he has been able to drink fluids that difficulty. Stated he currently works at [employer] and that he's been eating hamburger and french fries the past couple of days. Stated that on Saturday he drank something, unable to recall and believes that whatever he drank on Saturdays with leading to discomfort that started on Sunday. Denied melena, hematochezia, mucus in the stools, fever, chills, chest pain, shortness of breath, difficulty breathing. PCP none  Past Medical History  Diagnosis Date  . Hemorrhoids   . Bronchitis    History reviewed. No pertinent past surgical history. No family history on file. History  Substance Use Topics  . Smoking status: Current Every Day Smoker -- 0.20 packs/day  . Smokeless tobacco: Not on file  . Alcohol Use: Yes     Comment: occ    Review of Systems  Constitutional: Negative for fever and chills.  Respiratory: Negative for chest tightness and shortness of breath.   Cardiovascular: Negative for chest pain.  Gastrointestinal: Positive for nausea, abdominal pain and diarrhea. Negative for vomiting, constipation,  blood in stool and anal bleeding.  Genitourinary: Negative for dysuria, decreased urine volume, discharge, penile swelling and penile pain.  Musculoskeletal: Negative for back pain and neck pain.  Neurological: Negative for weakness.      Allergies  Review of patient's allergies indicates no known allergies.  Home Medications   Prior to Admission medications   Medication Sig Start Date End Date Taking? Authorizing Provider  acetaminophen (TYLENOL) 325 MG tablet Take 650 mg by mouth every 6 (six) hours as needed for moderate pain or headache.   Yes Historical Provider, MD  cephALEXin (KEFLEX) 500 MG capsule Take 1 capsule (500 mg total) by mouth 2 (two) times daily. 11/16/13   Aniayah Alaniz, PA-C  ondansetron (ZOFRAN) 4 MG tablet Take 1 tablet (4 mg total) by mouth every 6 (six) hours. 11/16/13   Genessis Flanary, PA-C   BP 120/73  Pulse 67  Temp(Src) 98.3 F (36.8 C) (Oral)  Resp 18  Ht $R'5\' 11"'dg$  (1.803 m)  Wt 170 lb (77.111 kg)  BMI 23.72 kg/m2  SpO2 95% Physical Exam  Nursing note and vitals reviewed. Constitutional: He is oriented to person, place, and time. He appears well-developed and well-nourished. No distress.  Patient laying comfortably in without any signs of respiratory distress  HENT:  Head: Normocephalic and atraumatic.  Mouth/Throat: Oropharynx is clear and moist. No oropharyngeal exudate.  Eyes: Conjunctivae and EOM are normal. Pupils are equal, round, and reactive to light.  Neck: Normal range of motion. Neck supple. No tracheal deviation present.  Cardiovascular: Normal rate, regular rhythm and normal heart sounds.  Exam reveals no friction rub.   No murmur heard. Pulses:  Radial pulses are 2+ on the right side, and 2+ on the left side.       Dorsalis pedis pulses are 2+ on the right side, and 2+ on the left side.  Pulmonary/Chest: Effort normal and breath sounds normal. No respiratory distress. He has no wheezes. He has no rales.  Abdominal: Soft. Bowel  sounds are normal. He exhibits no distension. There is tenderness. There is no rebound and no guarding.  Abdominal distention Bowel sounds normal active in all 4 quadrants Abdomen soft upon palpation with tenderness to the suprapubic and bilateral lower quadrants, right more so than left  Musculoskeletal: Normal range of motion.  Full ROM to upper and lower extremities without difficulty noted, negative ataxia noted.  Lymphadenopathy:    He has no cervical adenopathy.  Neurological: He is alert and oriented to person, place, and time. No cranial nerve deficit. He exhibits normal muscle tone. Coordination normal.  Skin: Skin is warm and dry. No rash noted. He is not diaphoretic. No erythema.  Psychiatric: He has a normal mood and affect. His behavior is normal. Thought content normal.    ED Course  Procedures (including critical care time)  Results for orders placed during the hospital encounter of 11/16/13  CBC WITH DIFFERENTIAL      Result Value Ref Range   WBC 5.6  4.0 - 10.5 K/uL   RBC 5.40  4.22 - 5.81 MIL/uL   Hemoglobin 16.5  13.0 - 17.0 g/dL   HCT 52.8  61.3 - 36.4 %   MCV 88.5  78.0 - 100.0 fL   MCH 30.6  26.0 - 34.0 pg   MCHC 34.5  30.0 - 36.0 g/dL   RDW 93.2  27.9 - 00.7 %   Platelets 272  150 - 400 K/uL   Neutrophils Relative % 42 (*) 43 - 77 %   Lymphocytes Relative 38  12 - 46 %   Monocytes Relative 10  3 - 12 %   Eosinophils Relative 9 (*) 0 - 5 %   Basophils Relative 1  0 - 1 %   Neutro Abs 2.3  1.7 - 7.7 K/uL   Lymphs Abs 2.1  0.7 - 4.0 K/uL   Monocytes Absolute 0.6  0.1 - 1.0 K/uL   Eosinophils Absolute 0.5  0.0 - 0.7 K/uL   Basophils Absolute 0.1  0.0 - 0.1 K/uL   WBC Morphology ATYPICAL LYMPHOCYTES    COMPREHENSIVE METABOLIC PANEL      Result Value Ref Range   Sodium 140  137 - 147 mEq/L   Potassium 3.7  3.7 - 5.3 mEq/L   Chloride 105  96 - 112 mEq/L   CO2 24  19 - 32 mEq/L   Glucose, Bld 92  70 - 99 mg/dL   BUN 10  6 - 23 mg/dL   Creatinine, Ser 5.40   0.50 - 1.35 mg/dL   Calcium 8.8  8.4 - 99.8 mg/dL   Total Protein 7.9  6.0 - 8.3 g/dL   Albumin 3.1 (*) 3.5 - 5.2 g/dL   AST 30  0 - 37 U/L   ALT 29  0 - 53 U/L   Alkaline Phosphatase 76  39 - 117 U/L   Total Bilirubin 0.8  0.3 - 1.2 mg/dL   GFR calc non Af Amer >90  >90 mL/min   GFR calc Af Amer >90  >90 mL/min  LIPASE, BLOOD      Result Value Ref Range   Lipase 22  11 -  59 U/L  URINALYSIS, ROUTINE W REFLEX MICROSCOPIC      Result Value Ref Range   Color, Urine AMBER (*) YELLOW   APPearance CLOUDY (*) CLEAR   Specific Gravity, Urine 1.033 (*) 1.005 - 1.030   pH 7.0  5.0 - 8.0   Glucose, UA NEGATIVE  NEGATIVE mg/dL   Hgb urine dipstick NEGATIVE  NEGATIVE   Bilirubin Urine NEGATIVE  NEGATIVE   Ketones, ur 15 (*) NEGATIVE mg/dL   Protein, ur 30 (*) NEGATIVE mg/dL   Urobilinogen, UA 1.0  0.0 - 1.0 mg/dL   Nitrite NEGATIVE  NEGATIVE   Leukocytes, UA MODERATE (*) NEGATIVE  URINE MICROSCOPIC-ADD ON      Result Value Ref Range   Squamous Epithelial / LPF RARE  RARE   WBC, UA 21-50  <3 WBC/hpf   RBC / HPF 0-2  <3 RBC/hpf   Bacteria, UA RARE  RARE   Casts HYALINE CASTS (*) NEGATIVE   Crystals CA OXALATE CRYSTALS (*) NEGATIVE   Urine-Other MUCOUS PRESENT      Labs Review Labs Reviewed  CBC WITH DIFFERENTIAL - Abnormal; Notable for the following:    Neutrophils Relative % 42 (*)    Eosinophils Relative 9 (*)    All other components within normal limits  COMPREHENSIVE METABOLIC PANEL - Abnormal; Notable for the following:    Albumin 3.1 (*)    All other components within normal limits  URINALYSIS, ROUTINE W REFLEX MICROSCOPIC - Abnormal; Notable for the following:    Color, Urine AMBER (*)    APPearance CLOUDY (*)    Specific Gravity, Urine 1.033 (*)    Ketones, ur 15 (*)    Protein, ur 30 (*)    Leukocytes, UA MODERATE (*)    All other components within normal limits  URINE MICROSCOPIC-ADD ON - Abnormal; Notable for the following:    Casts HYALINE CASTS (*)    Crystals  CA OXALATE CRYSTALS (*)    All other components within normal limits  URINE CULTURE  LIPASE, BLOOD    Imaging Review Ct Abdomen Pelvis W Contrast  11/16/2013   CLINICAL DATA:  28 year old male with right-sided abdominal and pelvic pain.  EXAM: CT ABDOMEN AND PELVIS WITH CONTRAST  TECHNIQUE: Multidetector CT imaging of the abdomen and pelvis was performed using the standard protocol following bolus administration of intravenous contrast.  CONTRAST:  119mL OMNIPAQUE IOHEXOL 300 MG/ML  SOLN  COMPARISON:  None.  FINDINGS: The liver, gallbladder, spleen, adrenal glands, pancreas and kidneys are unremarkable.  There is no evidence of free fluid, enlarged lymph nodes, biliary dilation or abdominal aortic aneurysm.  The bowel, bladder and appendix are unremarkable. There is no evidence of bowel obstruction, abscess or pneumoperitoneum.  A moderate apex left thoracolumbar scoliosis is noted. No acute bony abnormalities are present.  IMPRESSION: No acute abnormalities.  Thoracolumbar scoliosis.   Electronically Signed   By: Hassan Rowan M.D.   On: 11/16/2013 17:34     EKG Interpretation None      MDM   Final diagnoses:  UTI (lower urinary tract infection)  Gastroenteritis    Medications  sodium chloride 0.9 % bolus 1,000 mL (0 mLs Intravenous Stopped 11/16/13 1601)  ondansetron (ZOFRAN) injection 4 mg (4 mg Intravenous Given 11/16/13 1459)  iohexol (OMNIPAQUE) 300 MG/ML solution 100 mL (100 mLs Intravenous Contrast Given 11/16/13 1636)  cefTRIAXone (ROCEPHIN) 1 g in dextrose 5 % 50 mL IVPB (0 g Intravenous Stopped 11/16/13 1908)  sodium chloride 0.9 % bolus 1,000 mL (  1,000 mLs Intravenous New Bag/Given 11/16/13 1839)   Filed Vitals:   11/16/13 1500 11/16/13 1530 11/16/13 1545 11/16/13 1615  BP: 120/64 123/72 127/70 120/73  Pulse: 63 72 76 67  Temp:      TempSrc:      Resp:  18    Height:      Weight:      SpO2: 95% 99% 97% 95%   CBC negative elevated white blood cell count-negative left shift  or leukocytosis noted. CMP negative findings-kidney and liver functioning well. Negative elevation of liver enzymes. Negative elevated alkaline phosphatase. Negative elevated bilirubin. Electrolytes properly balanced. Lipase negative elevation. Urinalysis noted moderate leukocytes with white blood cell count of 21-50 with oxalate crystals identified. CT abdomen and pelvis with contrast negative for acute abnormalities. Liver, gallbladder, spleen, adrenal glands, pancreas and kidneys are unremarkable. Appendix unremarkable. Negative findings of bowel obstruction, abscess or pneumoperitoneum. Mild left thoracolumbar scoliosis noted. Urine culture pending. Doubt pancreatitis. Doubt appendicitis. Doubt pyelonephritis. Doubt acute abdominal processes. Patient presented to the ED with urinary tract infection, possible gastroenteritis. Patient given IV fluids and IV antibiotics while in ED setting. Patient tolerated fluids by mouth without episodes of emesis while in ED setting. Patient stable, afebrile. Patient not septic appearing. Negative acute abdominal processes identified on CT scan. Discharged patient. Discharged patient with antibiotics and Zofran. Discussed with patient to rest and stay hydrated. Discussed with patient proper diet. [facility]. Discussed with patient to closely monitor symptoms and if symptoms are to worsen or change to report back to the ED - strict return instructions given.  Patient agreed to plan of care, understood, all questions answered.   Jamse Mead, PA-C 11/16/13 2158

## 2013-11-16 NOTE — ED Notes (Signed)
Pt. Stated, Im over heated

## 2013-11-16 NOTE — ED Notes (Signed)
Pt. Stated, I've been having lower abd. Pain with some diarrhea since Sunday

## 2013-11-16 NOTE — ED Notes (Signed)
Pt was asked to give an urine sample. He stated that he couldn't give a sample right now

## 2013-11-16 NOTE — ED Notes (Signed)
Pt says he is having a lot of stomach pain (lower)

## 2013-11-16 NOTE — ED Notes (Signed)
Wrong documentation for heat exposure

## 2013-11-16 NOTE — Discharge Instructions (Signed)
Please call and set-up an appointment with [facility]  Please rest and stay hydrated Please take antibiotics as prescribed Please decrease intake of fat and greasy foods - please stick with a lite diet for the next couple of days Please drink plenty of water Please continue to monitor symptoms closely and if symptoms are to worsen or change (fever greater than 101, chills, sweating, nausea, vomiting, diarrhea, chest pain, shortness of breath, difficulty breathing, numbness, tingling, headache, dizziness, visual changes, blood in the stools, black tarry stools) please report back to the ED immediately   Urinary Tract Infection Urinary tract infections (UTIs) can develop anywhere along your urinary tract. Your urinary tract is your body's drainage system for removing wastes and extra water. Your urinary tract includes two kidneys, two ureters, a bladder, and a urethra. Your kidneys are a pair of bean-shaped organs. Each kidney is about the size of your fist. They are located below your ribs, one on each side of your spine. CAUSES Infections are caused by microbes, which are microscopic organisms, including fungi, viruses, and bacteria. These organisms are so small that they can only be seen through a microscope. Bacteria are the microbes that most commonly cause UTIs. SYMPTOMS  Symptoms of UTIs may vary by age and gender of the patient and by the location of the infection. Symptoms in young women typically include a frequent and intense urge to urinate and a painful, burning feeling in the bladder or urethra during urination. Older women and men are more likely to be tired, shaky, and weak and have muscle aches and abdominal pain. A fever may mean the infection is in your kidneys. Other symptoms of a kidney infection include pain in your back or sides below the ribs, nausea, and vomiting. DIAGNOSIS To diagnose a UTI, your caregiver will ask you about your symptoms. Your caregiver also  will ask to provide a urine sample. The urine sample will be tested for bacteria and white blood cells. White blood cells are made by your body to help fight infection. TREATMENT  Typically, UTIs can be treated with medication. Because most UTIs are caused by a bacterial infection, they usually can be treated with the use of antibiotics. The choice of antibiotic and length of treatment depend on your symptoms and the type of bacteria causing your infection. HOME CARE INSTRUCTIONS  If you were prescribed antibiotics, take them exactly as your caregiver instructs you. Finish the medication even if you feel better after you have only taken some of the medication.  Drink enough water and fluids to keep your urine clear or pale yellow.  Avoid caffeine, tea, and carbonated beverages. They tend to irritate your bladder.  Empty your bladder often. Avoid holding urine for long periods of time.  Empty your bladder before and after sexual intercourse.  After a bowel movement, women should cleanse from front to back. Use each tissue only once. SEEK MEDICAL CARE IF:   You have back pain.  You develop a fever.  Your symptoms do not begin to resolve within 3 days. SEEK IMMEDIATE MEDICAL CARE IF:   You have severe back pain or lower abdominal pain.  You develop chills.  You have nausea or vomiting.  You have continued burning or discomfort with urination. MAKE SURE YOU:   Understand these instructions.  Will watch your condition.  Will get help right away if you are not doing well or get worse. Document Released: 02/24/2005 Document Revised: 11/16/2011 Document Reviewed: 06/25/2011 ExitCare Patient  Information 2015 [place], [place]. This information is not intended to replace advice given to you by your health care provider. Make sure you discuss any questions you have with your health care provider.  Viral Gastroenteritis Viral gastroenteritis is also known as stomach flu. This condition  affects the stomach and intestinal tract. It can cause sudden diarrhea and vomiting. The illness typically lasts 3 to 8 days. Most people develop an immune response that eventually gets rid of the virus. While this natural response develops, the virus can make you quite ill. CAUSES  Many different viruses can cause gastroenteritis, such as rotavirus or noroviruses. You can catch one of these viruses by consuming contaminated food or water. You may also catch a virus by sharing utensils or other personal items with an infected person or by touching a contaminated surface. SYMPTOMS  The most common symptoms are diarrhea and vomiting. These problems can cause a severe loss of body fluids (dehydration) and a body salt (electrolyte) imbalance. Other symptoms may include:  Fever.  Headache.  Fatigue.  Abdominal pain. DIAGNOSIS  Your caregiver can usually diagnose viral gastroenteritis based on your symptoms and a physical exam. A stool sample may also be taken to test for the presence of viruses or other infections. TREATMENT  This illness typically goes away on its own. Treatments are aimed at rehydration. The most serious cases of viral gastroenteritis involve vomiting so severely that you are not able to keep fluids down. In these cases, fluids must be given through an intravenous line (IV). HOME CARE INSTRUCTIONS   Drink enough fluids to keep your urine clear or pale yellow. Drink small amounts of fluids frequently and increase the amounts as tolerated.  Ask your caregiver for specific rehydration instructions.  Avoid:  Foods high in sugar.  Alcohol.  Carbonated drinks.  Tobacco.  Juice.  Caffeine drinks.  Extremely hot or cold fluids.  Fatty, greasy foods.  Too much intake of anything at one time.  Dairy products until 24 to 48 hours after diarrhea stops.  You may consume probiotics. Probiotics are active cultures of beneficial bacteria. They may lessen the amount and  number of diarrheal stools in adults. Probiotics can be found in yogurt with active cultures and in supplements.  Wash your hands well to avoid spreading the virus.  Only take over-the-counter or prescription medicines for pain, discomfort, or fever as directed by your caregiver. Do not give aspirin to children. Antidiarrheal medicines are not recommended.  Ask your caregiver if you should continue to take your regular prescribed and over-the-counter medicines.  Keep all follow-up appointments as directed by your caregiver. SEEK IMMEDIATE MEDICAL CARE IF:   You are unable to keep fluids down.  You do not urinate at least once every 6 to 8 hours.  You develop shortness of breath.  You notice blood in your stool or vomit. This may look like coffee grounds.  You have abdominal pain that increases or is concentrated in one small area (localized).  You have persistent vomiting or diarrhea.  You have a fever.  The patient is a child younger than 3 months, and he or she has a fever.  The patient is a child older than 3 months, and he or she has a fever and persistent symptoms.  The patient is a child older than 3 months, and he or she has a fever and symptoms suddenly get worse.  The patient is a baby, and he or she has no tears when crying. MAKE SURE  YOU:   Understand these instructions.  Will watch your condition.  Will get help right away if you are not doing well or get worse. Document Released: 05/17/2005 Document Revised: 08/09/2011 Document Reviewed: 03/03/2011 [facility] Patient Information 2015 [place], [place]. This information is not intended to replace advice given to you by your health care provider. Make sure you discuss any questions you have with your health care provider.   Emergency Department Resource Guide 1) Find a Doctor and Pay Out of Pocket Although you won't have to find out who is covered by your insurance plan, it is a good idea to ask around and get  recommendations. You will then need to call the office and see if the doctor you have chosen will accept you as a new patient and what types of options they offer for patients who are self-pay. Some doctors offer discounts or will set up payment plans for their patients who do not have insurance, but you will need to ask so you aren't surprised when you get to your appointment.  2) Contact Your Local Health Department Not all health departments have doctors that can see patients for sick visits, but many do, so it is worth a call to see if yours does. If you don't know where your local health department is, you can check in your phone book. The CDC also has a tool to help you locate your state's health department, and many state websites also have listings of all of their local health departments.  3) Find a [facility] If your illness is not likely to be very severe or complicated, you may want to try a walk in clinic. These are popping up all over the country in pharmacies, drugstores, and shopping centers. They're usually staffed by nurse practitioners or physician assistants that have been trained to treat common illnesses and complaints. They're usually fairly quick and inexpensive. However, if you have serious medical issues or chronic medical problems, these are probably not your best option.  No Primary Care Doctor: - Call Health Connect at  [phone] - they can help you locate a primary care doctor that  accepts your insurance, provides certain services, etc. - Physician Referral Service- [phone]  Chronic Pain Problems: Organization         Address  Phone   Notes  [facility]  [phone] Patients need to be referred by their primary care doctor.   Medication Assistance: Organization         Address  Phone   Notes  [facility] Medication [facility] [address] [place] [place], [place] [address] [phone] --Must be a resident of  [facility] -- Must have NO insurance coverage whatsoever (no Medicaid/ Medicare, etc.) -- The pt. MUST have a primary care doctor that directs their care regularly and follows them in the community   MedAssist  [phone]   [employer]  [phone]    Agencies that provide inexpensive medical care: Organization         Address  Phone   Notes  [place]  [phone]   [facility]    [phone]   [facility] [place], [place] [address] [phone]   [place] [phone]   Planned Parenthood    [phone]   [facility]    [phone]   Community Health and  [address], [place] Phone:  [phone], Fax:  [phone] Hours of Operation:  9 am - 6 pm, M-F.  Also accepts Medicaid/Medicare and self-pay.  [facility], [address], [place] Phone: [phone], Fax: [phone]. Hours of Operation:  8:30 am - 5:30 pm, M-F.  Also accepts Medicaid and self-pay.  [facility] [place] [address] [place] Phone: [phone]   [place], [place], [place] [phone], Ext. [phone] Mondays & Thursdays: 7-9 AM.  First 15 patients are seen on a first come, first serve basis.    [place] Providers:  Organization         Address  Phone   Notes  [facility] [address], [place] [phone] Also accepts self-pay patients.  [facility] [address], [place]  [phone]   [place], [address], [place] [phone]   [facility] [address] [place] [phone]   Lucianne Lei [address], [place]   [phone] Only accepts [place] Access  [place] patients after they have their name applied to their card.   Self-Pay (no insurance) in [facility]:  Organization         Address  Phone   Notes  Sickle Cell Patients, [facility] Internal Medicine [place] [phone]   [facility] Urgent Care [place] [phone]   [facility] [place]  [place], [place], [place] [phone]   [facility]/Dr. Osei-Bonsu  [address] [place] or [address], [place] [phone] Phone number for both [place] and [place] locations is the same.  Urgent Medical and [facility] [address] [place] [phone]   [facility] [address] [place] or [address] [phone]   [facility] [address] [place] [phone], phone; [phone], fax Sees patients 1st and 3rd Saturday of every month.  Must not qualify for public or private insurance (i.e. Medicaid, Medicare, [place] Health Choice, Veterans' Benefits)  Household income should be no more than 200% of the poverty level The clinic cannot treat you if you are pregnant or think you are pregnant  Sexually transmitted diseases are not treated at the clinic.    Dental Care: Organization         Address  Phone  Notes  [facility] Department of [facility] [phone] Accepts children up to age 77 who are enrolled in [place] or [place]; pregnant women with a Medicaid card; and children who have applied for Medicaid or [place] Health Choice, but were declined, whose parents can pay a reduced fee at time of service.  [facility] Department of [facility]  [address], [place] [phone] Accepts children up to age 49 who are enrolled in [place] or [place]; pregnant women with a Medicaid  card; and children who have applied for Medicaid or [place] Health Choice, but were declined, whose parents can pay a reduced fee at time of service.  [place] Adult Dental Access PROGRAM  [place],  [place] [phone] Patients are seen by appointment only. Walk-ins are not accepted. [place] will see patients 53 years of age and older. Monday - Tuesday (8am-5pm) Most Wednesdays (8:30-5pm) $30 per visit, cash only  [facility] Adult Dental Access PROGRAM  [address], [place] [phone] Patients are seen by appointment only. Walk-ins are not accepted. [place] will see patients 37 years of age and older. One Wednesday Evening (Monthly: Volunteer Based).  $30 per visit, cash only  [place]  [phone] for adults; Children under age 60, call Graduate Pediatric Dentistry at [phone]. Children aged 21-14, please call [phone] to request a pediatric application.  Dental services are provided in all areas of dental care including fillings, crowns and bridges, complete and partial dentures, implants, gum treatment, root canals, and extractions. Preventive care is also provided. Treatment is provided to both adults and children. Patients are selected via a lottery and there is often a waiting list.   [facility] [address] [place]  [phone] [email or web address]   [facility] [address] [place], [place] [phone], Ext. [phone] Second and Fourth Thursday of each month, opens at 6:30 AM; Clinic ends at 9 AM.  Patients are seen on a first-come first-served basis, and a limited number are seen during each clinic.   [facility]  [address] [place], [place] [phone]   Eligibility Requirements You must have lived in [place], [place], or [place] counties for at least the last three months.   You cannot be eligible for state or federal sponsored [employer],  including [employer], [place], or [employer].   You generally cannot be eligible for healthcare insurance through your employer.    How to apply: Eligibility screenings are held every Tuesday and Wednesday afternoon from 1:00 pm until 4:00 pm. You do not need an appointment for the interview!  [facility] [address] [place], [place]   [place]  [place] Department  [place]  [phone]    Behavioral Health Resources in the Community: Intensive Outpatient Programs Organization         Address  Phone  Notes  [place]. [address], [place], [place] [phone]   [facility] Outpatient [address] [place], [place]   ADS: Alcohol & Drug Svcs [address], [place], [place]   [place] [address]  [place], [place] or [phone]   Substance Abuse Resources Organization         Address  Phone  Notes  Alcohol and Drug Services  [phone]   [place]  [phone]   The [place]   Chinita Pester  [phone]   Residential & Outpatient Substance Abuse Program  [phone]   Psychological Services Organization         Address  Phone  Notes  [facility] [place]  [place]  [phone]   [place] [address], [place] or [phone]    Mobile Crisis Teams Organization         Address  Phone  Notes  [facility], Mobile Crisis Care Unit  [phone]   [facility]  [address] [place], [place]   [place] [address] [place] [phone]    Self-Help/Support Groups Organization         Address  Phone             Notes  Mental Health Assoc.  of [place] - variety of support groups  Echo Call for more information  Narcotics Anonymous (NA), [facility]aring [facility] [address], [employer] [place]  2 meetings at this location   Special educational needs teacher         Address  Phone  Notes  [facility]idential Treatment [place],    [place]  [phone]   [facility]  [address], [place] [phone], [place], [place]   [place]o [place], [place] [phone] Admissions: 8am-3pm M-F  Incentives Substance [place] [address],    [place], [place] [phone]   [facility] [address] [place], [place], [place]   [facility] [address]  [place], [place]   [facility] Programs - Intensive Outpatient [address], [place], [place]   [facility] [address] [place] [address] [place].,  [place], [place] [phone] or [phone]   [facility] Services (RTS) [address] [place], [place] Accepts Medicaid  [facility] [place] [address]  [place] [phone] Substance Abuse/Addiction Treatment   [facility] Organization         Address  Phone  Notes  [facility]  [phone]   Domenic Schwab, PhD [address] Arlis [place], [place]   [phone] or [phone]   [facility] [place], [place] [phone]   [facility] [address] [place], [place] [phone] Insurance/Medicaid/sponsorship through [facility] and Families [address] [place]                                    [place], [place] [phone] [place] [address], [place] [phone]    Dr. Adele Schilder  [phone]   [facility] [place] Dept. 1) [address], [place] 2)  [place] 3)  [address], [place] [phone]  [phone]   [place] [phone] or [phone] (After Hours)

## 2013-11-17 NOTE — ED Provider Notes (Signed)
Medical screening examination/treatment/procedure(s) were performed by non-physician practitioner and as supervising physician I was immediately available for consultation/collaboration.   EKG Interpretation None        Blanchie Dessert, MD 11/17/13 1302

## 2013-11-18 LAB — URINE CULTURE
Colony Count: 3000
Special Requests: NORMAL

## 2013-11-20 NOTE — Discharge Planning (Signed)
[facility] CL was not able to see patient, GCCN orange card information and primary care resources will be sent to the address provided in EPIC.

## 2013-12-10 ENCOUNTER — Emergency Department (HOSPITAL_COMMUNITY)
Admission: EM | Admit: 2013-12-10 | Discharge: 2013-12-10 | Disposition: A | Discharge status: Home / Self Care | Payer: Self-pay | Attending: Emergency Medicine | Admitting: Emergency Medicine

## 2013-12-10 ENCOUNTER — Encounter (HOSPITAL_COMMUNITY): Payer: Self-pay | Admitting: Emergency Medicine

## 2013-12-10 DIAGNOSIS — R112 Nausea with vomiting, unspecified: Secondary | ICD-10-CM | POA: Insufficient documentation

## 2013-12-10 DIAGNOSIS — F17200 Nicotine dependence, unspecified, uncomplicated: Secondary | ICD-10-CM | POA: Insufficient documentation

## 2013-12-10 DIAGNOSIS — Z8709 Personal history of other diseases of the respiratory system: Secondary | ICD-10-CM | POA: Insufficient documentation

## 2013-12-10 DIAGNOSIS — Z8679 Personal history of other diseases of the circulatory system: Secondary | ICD-10-CM | POA: Insufficient documentation

## 2013-12-10 DIAGNOSIS — R197 Diarrhea, unspecified: Secondary | ICD-10-CM | POA: Insufficient documentation

## 2013-12-10 DIAGNOSIS — R109 Unspecified abdominal pain: Secondary | ICD-10-CM | POA: Insufficient documentation

## 2013-12-10 LAB — CBC WITH DIFFERENTIAL/PLATELET
BASOS ABS: 0.1 10*3/uL (ref 0.0–0.1)
Basophils Relative: 1 % (ref 0–1)
EOS PCT: 11 % — AB (ref 0–5)
Eosinophils Absolute: 0.6 10*3/uL (ref 0.0–0.7)
HEMATOCRIT: 48.4 % (ref 39.0–52.0)
HEMOGLOBIN: 16.9 g/dL (ref 13.0–17.0)
LYMPHS PCT: 46 % (ref 12–46)
Lymphs Abs: 2.4 10*3/uL (ref 0.7–4.0)
MCH: 31.3 pg (ref 26.0–34.0)
MCHC: 34.9 g/dL (ref 30.0–36.0)
MCV: 89.6 fL (ref 78.0–100.0)
Monocytes Absolute: 0.6 10*3/uL (ref 0.1–1.0)
Monocytes Relative: 11 % (ref 3–12)
Neutro Abs: 1.7 10*3/uL (ref 1.7–7.7)
Neutrophils Relative %: 31 % — ABNORMAL LOW (ref 43–77)
Platelets: 264 10*3/uL (ref 150–400)
RBC: 5.4 MIL/uL (ref 4.22–5.81)
RDW: 14.4 % (ref 11.5–15.5)
WBC: 5.4 10*3/uL (ref 4.0–10.5)

## 2013-12-10 LAB — COMPREHENSIVE METABOLIC PANEL
ALBUMIN: 3.6 g/dL (ref 3.5–5.2)
ALK PHOS: 77 U/L (ref 39–117)
ALT: 29 U/L (ref 0–53)
AST: 27 U/L (ref 0–37)
Anion gap: 11 (ref 5–15)
BILIRUBIN TOTAL: 1 mg/dL (ref 0.3–1.2)
BUN: 8 mg/dL (ref 6–23)
CHLORIDE: 104 meq/L (ref 96–112)
CO2: 25 meq/L (ref 19–32)
Calcium: 9 mg/dL (ref 8.4–10.5)
Creatinine, Ser: 1.06 mg/dL (ref 0.50–1.35)
GFR calc Af Amer: >90 mL/min (ref >90)
GFR calc non Af Amer: >90 mL/min (ref >90)
Glucose, Bld: 74 mg/dL (ref 70–99)
POTASSIUM: 4.4 meq/L (ref 3.7–5.3)
Sodium: 140 mEq/L (ref 137–147)
Total Protein: 8.4 g/dL — ABNORMAL HIGH (ref 6.0–8.3)

## 2013-12-10 LAB — LIPASE, BLOOD: Lipase: 18 U/L (ref 11–59)

## 2013-12-10 MED ORDER — ONDANSETRON 4 MG PO TBDP: ORAL_TABLET | ORAL | Status: DC

## 2013-12-10 NOTE — Discharge Instructions (Signed)
If you were given medicines take as directed.  If you are on coumadin or contraceptives realize their levels and effectiveness is altered by many different medicines.  If you have any reaction (rash, tongues swelling, other) to the medicines stop taking and see a physician.   Please follow up as directed and return to the ER or see a physician for new or worsening symptoms.  Thank you. Filed Vitals:   12/10/13 1508 12/10/13 1720 12/10/13 1830  BP: 115/80 121/78 135/87  Pulse: 69 58 64  Temp: 98.3 F (36.8 C)    TempSrc: Oral    Resp: 18 18   Height: $Remove'5\' 11"'NCILlnW$  (1.803 m)    Weight: 170 lb (77.111 kg)    SpO2: 97% 96% 99%

## 2013-12-10 NOTE — ED Notes (Addendum)
Main lab states they will add lipase on.

## 2013-12-10 NOTE — ED Notes (Signed)
Pt given ginger ale, per Dr. Reather Converse.

## 2013-12-10 NOTE — ED Notes (Signed)
Presents with diarrhea x 4 today and 7 yesterday as well as nausea. Reports abdominal pain after eating, usually after eating salad. HX of gastroenteritis and this feels the same.  Denies blood in stool. Denies fevers. Abdomen soft, non tender.

## 2013-12-10 NOTE — ED Provider Notes (Signed)
CSN: [id number]     Arrival date & time 12/10/13  1436 History   First MD Initiated Contact with Patient 12/10/13 1809     Chief Complaint  Patient presents with  . Nausea  . Diarrhea     (Consider location/radiation/quality/duration/timing/severity/associated sxs/prior Treatment) HPI Comments: 28 year old male with history of alcohol or smoking abuse presents with recurrent vomiting and diarrhea.  Patient had diarrhea nonbloody today for episodes and 7 yesterday with nausea however no vomiting today. Patient tolerating fluids without difficulty today. No history of GI issues, recent antibiotics or fevers or chills. No recent travel. Patient has mild epigastric pain after eating all types of food. Patient drink alcohol yesterday.  Patient is a 28 y.o. male presenting with diarrhea. The history is provided by the patient.  Diarrhea Associated symptoms: abdominal pain and vomiting   Associated symptoms: no chills, no fever and no headaches     Past Medical History  Diagnosis Date  . Hemorrhoids   . Bronchitis    History reviewed. No pertinent past surgical history. History reviewed. No pertinent family history. History  Substance Use Topics  . Smoking status: Current Every Day Smoker -- 0.20 packs/day  . Smokeless tobacco: Not on file  . Alcohol Use: Yes     Comment: occ    Review of Systems  Constitutional: Positive for appetite change. Negative for fever and chills.  HENT: Negative for congestion.   Eyes: Negative for visual disturbance.  Respiratory: Negative for shortness of breath.   Cardiovascular: Negative for chest pain.  Gastrointestinal: Positive for vomiting, abdominal pain and diarrhea.  Genitourinary: Negative for dysuria and flank pain.  Musculoskeletal: Negative for back pain, neck pain and neck stiffness.  Skin: Negative for rash.  Neurological: Negative for light-headedness and headaches.      Allergies  Review of patient's allergies indicates no known  allergies.  Home Medications   Prior to Admission medications   Medication Sig Start Date End Date Taking? Authorizing Provider  ondansetron (ZOFRAN ODT) 4 MG disintegrating tablet 4mg  ODT q4 hours prn nausea/vomit 12/10/13   Mariea Clonts, MD   BP 135/87  Pulse 64  Temp(Src) 98.3 F (36.8 C) (Oral)  Resp 18  Ht 5\' 11"  (1.803 m)  Wt 170 lb (77.111 kg)  BMI 23.72 kg/m2  SpO2 99% Physical Exam  Nursing note and vitals reviewed. Constitutional: He is oriented to person, place, and time. He appears well-developed and well-nourished.  HENT:  Head: Normocephalic and atraumatic.  Eyes: Conjunctivae are normal. Right eye exhibits no discharge. Left eye exhibits no discharge.  Neck: Normal range of motion. Neck supple. No tracheal deviation present.  Cardiovascular: Normal rate and regular rhythm.   Pulmonary/Chest: Effort normal and breath sounds normal.  Abdominal: Soft. He exhibits no distension. There is no tenderness. There is no guarding.  Musculoskeletal: He exhibits no edema.  Neurological: He is alert and oriented to person, place, and time.  Skin: Skin is warm. No rash noted.  Psychiatric: He has a normal mood and affect.    ED Course  Procedures (including critical care time)  EMERGENCY DEPARTMENT BILIARY ULTRASOUND INTERPRETATION "Study: Limited Abdominal Ultrasound of the gallbladder and common bile duct."  INDICATIONS: Abdominal pain, Nausea and Vomiting Indication: Multiple views of the gallbladder and common bile duct were obtained in real-time with a Multi-frequency probe." PERFORMED BY:  Myself IMAGES ARCHIVED?: Yes FINDINGS: Gallstones absent, Gallbladder wall normal in thickness and Sonographic Murphy's sign absent LIMITATIONS: Bowel Gas INTERPRETATION: Normal   Labs Review  Labs Reviewed  CBC WITH DIFFERENTIAL - Abnormal; Notable for the following:    Neutrophils Relative % 31 (*)    Eosinophils Relative 11 (*)    All other components within normal  limits  COMPREHENSIVE METABOLIC PANEL - Abnormal; Notable for the following:    Total Protein 8.4 (*)    All other components within normal limits  LIPASE, BLOOD    Imaging Review No results found.   EKG Interpretation None      MDM   Final diagnoses:  Nausea vomiting and diarrhea   Well-appearing male with no current abdominal pain. Discussed differential gastroenteritis, ulcers, biliary. Blood work unremarkable patient well-appearing in ER. Reasons to return given. Patient tolerated oral in ER.  Bedside US unremarkable.   Results and differential diagnosis were discussed with the patient/parent/guardian. Close follow up outpatient was discussed, comfortable with the plan.   Medications - No data to display  Filed Vitals:   12/10/13 1508 12/10/13 1720 12/10/13 1830  BP: 115/80 121/78 135/87  Pulse: 69 58 64  Temp: 98.3 F (36.8 C)    TempSrc: Oral    Resp: 18 18   Height: $Remove'5\' 11"'[id number]$  (1.803 m)    Weight: 170 lb (77.111 kg)    SpO2: 97% 96% 99%        Mariea Clonts, MD 12/11/13 [phone]

## 2015-03-14 ENCOUNTER — Emergency Department (INDEPENDENT_AMBULATORY_CARE_PROVIDER_SITE_OTHER)
Admission: EM | Admit: 2015-03-14 | Discharge: 2015-03-14 | Disposition: A | Discharge status: Home / Self Care | Payer: Self-pay | Source: Home / Self Care

## 2015-03-14 ENCOUNTER — Encounter (HOSPITAL_COMMUNITY): Payer: Self-pay | Admitting: *Deleted

## 2015-03-14 DIAGNOSIS — H109 Unspecified conjunctivitis: Secondary | ICD-10-CM

## 2015-03-14 MED ORDER — POLYMYXIN B-TRIMETHOPRIM 10000-0.1 UNIT/ML-% OP SOLN: 2.0000 [drp] | Freq: Four times a day (QID) | OPHTHALMIC | Status: DC

## 2015-03-14 MED ORDER — FLUORESCEIN SODIUM 1 MG OP STRP
ORAL_STRIP | OPHTHALMIC | Status: AC
  Filled 2015-03-14: qty 1

## 2015-03-14 MED ORDER — TETRACAINE HCL 0.5 % OP SOLN
OPHTHALMIC | Status: AC
Start: 2015-03-14 — End: 2015-03-14
  Filled 2015-03-14: qty 2

## 2015-03-14 NOTE — ED Provider Notes (Signed)
CSN: [id number]     Arrival date & time 03/14/15  1844 History   None    Chief Complaint  Patient presents with  . Conjunctivitis   (Consider location/radiation/quality/duration/timing/severity/associated sxs/prior Treatment) Patient is a 29 y.o. male presenting with conjunctivitis.  Conjunctivitis This is a new problem. The current episode started yesterday. The problem has been gradually worsening. Pertinent negatives include no chest pain, no abdominal pain, no headaches and no shortness of breath.  Denies sick contact.   Past Medical History  Diagnosis Date  . Hemorrhoids   . Bronchitis    History reviewed. No pertinent past surgical history. No family history on file. Social History  Substance Use Topics  . Smoking status: Current Every Day Smoker -- 0.20 packs/day  . Smokeless tobacco: None  . Alcohol Use: Yes     Comment: weekly    Review of Systems  Eyes: Positive for discharge (copious, purulent) and redness. Negative for photophobia, pain, itching and visual disturbance.  Respiratory: Negative for shortness of breath.   Cardiovascular: Negative for chest pain.  Gastrointestinal: Negative for abdominal pain.  Neurological: Negative for headaches.    Allergies  Review of patient's allergies indicates no known allergies.  Home Medications   Prior to Admission medications   Medication Sig Start Date End Date Taking? Authorizing Provider  ondansetron (ZOFRAN ODT) 4 MG disintegrating tablet 4mg  ODT q4 hours prn nausea/vomit 12/10/13   Elnora Morrison, MD  trimethoprim-polymyxin b (POLYTRIM) ophthalmic solution Place 2 drops into the right eye every 6 (six) hours. 03/14/15   Tereasa Coop, PA-C   Meds Ordered and Administered this Visit  Medications - No data to display  BP 139/80 mmHg  Pulse 82  Temp(Src) 98.2 F (36.8 C) (Oral)  Resp 16  SpO2 96% No data found.   Physical Exam  Eyes: EOM are normal. Pupils are equal, round, and reactive to light. Right  eye exhibits discharge and exudate. Right conjunctiva is injected. Right conjunctiva has no hemorrhage.  Cardiovascular: Normal rate.   Pulmonary/Chest: Effort normal.  Nursing note and vitals reviewed.   ED Course  Procedures (including critical care time)  Labs Review Labs Reviewed - No data to display  Imaging Review No results found.        MDM   1. Conjunctivitis of right eye    Likely bacterial.  Will treat with Polytrim.      Tereasa Coop, PA-C 03/14/15 [phone]

## 2015-03-14 NOTE — ED Notes (Addendum)
C/O right eye redness, swelling, purulent discharge since yesterday.  Denies vision changes.  Does not wear contact lenses.  Supposed to wear glasses - has not for 10 yrs.  Strongly encouraged pt to have eye exam for glasses.

## 2015-03-14 NOTE — Discharge Instructions (Signed)

## 2015-03-25 ENCOUNTER — Encounter (HOSPITAL_COMMUNITY): Payer: Self-pay | Admitting: *Deleted

## 2015-03-25 ENCOUNTER — Emergency Department (HOSPITAL_COMMUNITY)
Admission: EM | Admit: 2015-03-25 | Discharge: 2015-03-25 | Disposition: A | Discharge status: Home / Self Care | Payer: Self-pay | Attending: Emergency Medicine | Admitting: Emergency Medicine

## 2015-03-25 ENCOUNTER — Emergency Department (HOSPITAL_COMMUNITY): Payer: Self-pay

## 2015-03-25 DIAGNOSIS — R0602 Shortness of breath: Secondary | ICD-10-CM | POA: Insufficient documentation

## 2015-03-25 DIAGNOSIS — Z8719 Personal history of other diseases of the digestive system: Secondary | ICD-10-CM | POA: Insufficient documentation

## 2015-03-25 DIAGNOSIS — Z8679 Personal history of other diseases of the circulatory system: Secondary | ICD-10-CM | POA: Insufficient documentation

## 2015-03-25 DIAGNOSIS — R062 Wheezing: Secondary | ICD-10-CM | POA: Insufficient documentation

## 2015-03-25 DIAGNOSIS — Z720 Tobacco use: Secondary | ICD-10-CM | POA: Insufficient documentation

## 2015-03-25 LAB — CBC WITH DIFFERENTIAL/PLATELET
BASOS ABS: 0.1 10*3/uL (ref 0.0–0.1)
BASOS PCT: 1 %
Eosinophils Absolute: 1 10*3/uL — ABNORMAL HIGH (ref 0.0–0.7)
Eosinophils Relative: 15 %
HEMATOCRIT: 44.5 % (ref 39.0–52.0)
HEMOGLOBIN: 15.2 g/dL (ref 13.0–17.0)
Lymphocytes Relative: 41 %
Lymphs Abs: 2.7 10*3/uL (ref 0.7–4.0)
MCH: 31.3 pg (ref 26.0–34.0)
MCHC: 34.2 g/dL (ref 30.0–36.0)
MCV: 91.8 fL (ref 78.0–100.0)
MONOS PCT: 8 %
Monocytes Absolute: 0.5 10*3/uL (ref 0.1–1.0)
NEUTROS ABS: 2.2 10*3/uL (ref 1.7–7.7)
NEUTROS PCT: 35 %
Platelets: 243 10*3/uL (ref 150–400)
RBC: 4.85 MIL/uL (ref 4.22–5.81)
RDW: 14.2 % (ref 11.5–15.5)
WBC: 6.5 10*3/uL (ref 4.0–10.5)

## 2015-03-25 LAB — COMPREHENSIVE METABOLIC PANEL
ALBUMIN: 3 g/dL — AB (ref 3.5–5.0)
ALK PHOS: 81 U/L (ref 38–126)
ALT: 22 U/L (ref 17–63)
AST: 30 U/L (ref 15–41)
Anion gap: 5 (ref 5–15)
BILIRUBIN TOTAL: 1.3 mg/dL — AB (ref 0.3–1.2)
BUN: 6 mg/dL (ref 6–20)
CALCIUM: 8.8 mg/dL — AB (ref 8.9–10.3)
CO2: 30 mmol/L (ref 22–32)
CREATININE: 1.08 mg/dL (ref 0.61–1.24)
Chloride: 104 mmol/L (ref 101–111)
GFR calc Af Amer: >60 mL/min (ref >60)
GFR calc non Af Amer: >60 mL/min (ref >60)
GLUCOSE: 68 mg/dL (ref 65–99)
Potassium: 3.9 mmol/L (ref 3.5–5.1)
Sodium: 139 mmol/L (ref 135–145)
TOTAL PROTEIN: 8.1 g/dL (ref 6.5–8.1)

## 2015-03-25 MED ORDER — PREDNISONE 20 MG PO TABS
50.0000 mg | ORAL_TABLET | Freq: Once | ORAL | Status: AC
  Administered 2015-03-25: 50 mg via ORAL
  Filled 2015-03-25: qty 3

## 2015-03-25 MED ORDER — IPRATROPIUM-ALBUTEROL 0.5-2.5 (3) MG/3ML IN SOLN
3.0000 mL | Freq: Once | RESPIRATORY_TRACT | Status: AC
  Administered 2015-03-25: 3 mL via RESPIRATORY_TRACT
  Filled 2015-03-25: qty 3

## 2015-03-25 MED ORDER — ALBUTEROL SULFATE HFA 108 (90 BASE) MCG/ACT IN AERS
1.0000 | INHALATION_SPRAY | RESPIRATORY_TRACT | Status: DC | PRN
  Filled 2015-03-25: qty 6.7

## 2015-03-25 MED ORDER — PREDNISONE 50 MG PO TABS: ORAL_TABLET | ORAL | Status: DC

## 2015-03-25 NOTE — ED Notes (Signed)
Pt ambulated 100+ feet in hallway. Pt states he has to try to breath harder towards end of ambulating, but pt doesn't want to say "difficulty breathing". PT's o2 saturation dropped from 95% to 88% during ambulation. Pt's pulse stayed in the 70's during ambulation.

## 2015-03-25 NOTE — ED Notes (Signed)
Patient transported to X-ray 
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
## 2015-03-25 NOTE — ED Notes (Signed)
PT placed in gown and in bed. Pt monitored by pulse ox, bp cuff, and 12-lead. 

## 2015-03-25 NOTE — Discharge Instructions (Signed)
-   Call [facility] or another primary care clinic in the resource guide to schedule a follow up appointment for early next week - Use albuterol inhaler as needed for shortness of breath or wheezing. Rest at home and refrain from strenuous activity for the next few days - Return to ED with new or worsening symptoms   Shortness of Breath Shortness of breath means you have trouble breathing. It could also mean that you have a medical problem. You should get immediate medical care for shortness of breath. CAUSES   Not enough oxygen in the air such as with high altitudes or a smoke-filled room.  Certain lung diseases, infections, or problems.  Heart disease or conditions, such as angina or heart failure.  Low red blood cells (anemia).  Poor physical fitness, which can cause shortness of breath when you exercise.  Chest or back injuries or stiffness.  Being overweight.  Smoking.  Anxiety, which can make you feel like you are not getting enough air. DIAGNOSIS  Serious medical problems can often be found during your physical exam. Tests may also be done to determine why you are having shortness of breath. Tests may include:  Chest X-rays.  Lung function tests.  Blood tests.  An electrocardiogram (ECG).  An ambulatory electrocardiogram. An ambulatory ECG records your heartbeat patterns over a 24-hour period.  Exercise testing.  A transthoracic echocardiogram (TTE). During echocardiography, sound waves are used to evaluate how blood flows through your heart.  A transesophageal echocardiogram (TEE).  Imaging scans. Your health care provider may not be able to find a cause for your shortness of breath after your exam. In this case, it is important to have a follow-up exam with your health care provider as directed.  TREATMENT  Treatment for shortness of breath depends on the cause of your symptoms and can vary greatly. HOME CARE INSTRUCTIONS   Do not smoke.  Smoking is a common cause of shortness of breath. If you smoke, ask for help to quit.  Avoid being around chemicals or things that may bother your breathing, such as paint fumes and dust.  Rest as needed. Slowly resume your usual activities.  If medicines were prescribed, take them as directed for the full length of time directed. This includes oxygen and any inhaled medicines.  Keep all follow-up appointments as directed by your health care provider. SEEK MEDICAL CARE IF:   Your condition does not improve in the time expected.  You have a hard time doing your normal activities even with rest.  You have any new symptoms. SEEK IMMEDIATE MEDICAL CARE IF:   Your shortness of breath gets worse.  You feel light-headed, faint, or develop a cough not controlled with medicines.  You start coughing up blood.  You have pain with breathing.  You have chest pain or pain in your arms, shoulders, or abdomen.  You have a fever.  You are unable to walk up stairs or exercise the way you normally do. MAKE SURE YOU:  Understand these instructions.  Will watch your condition.  Will get help right away if you are not doing well or get worse.   This information is not intended to replace advice given to you by your health care provider. Make sure you discuss any questions you have with your health care provider.   Document Released: 02/09/2001 Document Revised: 05/22/2013 Document Reviewed: 08/02/2011 Elsevier Interactive Patient Education [employer].
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
## 2015-03-25 NOTE — ED Notes (Signed)
Pt is in stable condition upon d/c and ambulates from ED. 

## 2015-05-08 ENCOUNTER — Emergency Department (HOSPITAL_COMMUNITY)
Admission: EM | Admit: 2015-05-08 | Discharge: 2015-05-08 | Disposition: A | Discharge status: Home / Self Care | Payer: Self-pay | Attending: Emergency Medicine | Admitting: Emergency Medicine

## 2015-05-08 ENCOUNTER — Encounter (HOSPITAL_COMMUNITY): Payer: Self-pay | Admitting: *Deleted

## 2015-05-08 DIAGNOSIS — Y990 Civilian activity done for income or pay: Secondary | ICD-10-CM | POA: Insufficient documentation

## 2015-05-08 DIAGNOSIS — Y9289 Other specified places as the place of occurrence of the external cause: Secondary | ICD-10-CM | POA: Insufficient documentation

## 2015-05-08 DIAGNOSIS — Z8709 Personal history of other diseases of the respiratory system: Secondary | ICD-10-CM | POA: Insufficient documentation

## 2015-05-08 DIAGNOSIS — F17200 Nicotine dependence, unspecified, uncomplicated: Secondary | ICD-10-CM | POA: Insufficient documentation

## 2015-05-08 DIAGNOSIS — X500XXA Overexertion from strenuous movement or load, initial encounter: Secondary | ICD-10-CM | POA: Insufficient documentation

## 2015-05-08 DIAGNOSIS — S3992XA Unspecified injury of lower back, initial encounter: Secondary | ICD-10-CM | POA: Insufficient documentation

## 2015-05-08 DIAGNOSIS — M545 Low back pain, unspecified: Secondary | ICD-10-CM

## 2015-05-08 DIAGNOSIS — Y9389 Activity, other specified: Secondary | ICD-10-CM | POA: Insufficient documentation

## 2015-05-08 DIAGNOSIS — Z8719 Personal history of other diseases of the digestive system: Secondary | ICD-10-CM | POA: Insufficient documentation

## 2015-05-08 MED ORDER — KETOROLAC TROMETHAMINE 60 MG/2ML IM SOLN
60.0000 mg | Freq: Once | INTRAMUSCULAR | Status: AC
  Administered 2015-05-08: 60 mg via INTRAMUSCULAR
  Filled 2015-05-08: qty 2

## 2015-05-08 MED ORDER — DIAZEPAM 5 MG PO TABS
5.0000 mg | ORAL_TABLET | Freq: Once | ORAL | Status: AC
Start: 2015-05-08 — End: 2015-05-08
  Administered 2015-05-08: 5 mg via ORAL
  Filled 2015-05-08: qty 1

## 2015-05-08 MED ORDER — IBUPROFEN 600 MG PO TABS: 600.0000 mg | ORAL_TABLET | Freq: Four times a day (QID) | ORAL | Status: DC | PRN

## 2015-05-08 NOTE — Discharge Instructions (Signed)
Please take your medications as prescribed for your back pain. You may also consider wearing a back brace while at work. Return to ED for any new or worsening symptoms.  Back Pain, Adult Back pain is very common in adults.The cause of back pain is rarely dangerous and the pain often gets better over time.The cause of your back pain may not be known. Some common causes of back pain include:  Strain of the muscles or ligaments supporting the spine.  Wear and tear (degeneration) of the spinal disks.  Arthritis.  Direct injury to the back. For many people, back pain may return. Since back pain is rarely dangerous, most people can learn to manage this condition on their own. HOME CARE INSTRUCTIONS Watch your back pain for any changes. The following actions may help to lessen any discomfort you are feeling:  Remain active. It is stressful on your back to sit or stand in one place for long periods of time. Do not sit, drive, or stand in one place for more than 30 minutes at a time. Take short walks on even surfaces as soon as you are able.Try to increase the length of time you walk each day.  Exercise regularly as directed by your health care provider. Exercise helps your back heal faster. It also helps avoid future injury by keeping your muscles strong and flexible.  Do not stay in bed.Resting more than 1-2 days can delay your recovery.  Pay attention to your body when you bend and lift. The most comfortable positions are those that put less stress on your recovering back. Always use proper lifting techniques, including:  Bending your knees.  Keeping the load close to your body.  Avoiding twisting.  Find a comfortable position to sleep. Use a firm mattress and lie on your side with your knees slightly bent. If you lie on your back, put a pillow under your knees.  Avoid feeling anxious or stressed.Stress increases muscle tension and can worsen back pain.It is important to recognize  when you are anxious or stressed and learn ways to manage it, such as with exercise.  Take medicines only as directed by your health care provider. Over-the-counter medicines to reduce pain and inflammation are often the most helpful.Your health care provider may prescribe muscle relaxant drugs.These medicines help dull your pain so you can more quickly return to your normal activities and healthy exercise.  Apply ice to the injured area:  Put ice in a plastic bag.  Place a towel between your skin and the bag.  Leave the ice on for 20 minutes, 2-3 times a day for the first 2-3 days. After that, ice and heat may be alternated to reduce pain and spasms.  Maintain a healthy weight. Excess weight puts extra stress on your back and makes it difficult to maintain good posture. SEEK MEDICAL CARE IF:  You have pain that is not relieved with rest or medicine.  You have increasing pain going down into the legs or buttocks.  You have pain that does not improve in one week.  You have night pain.  You lose weight.  You have a fever or chills. SEEK IMMEDIATE MEDICAL CARE IF:   You develop new bowel or bladder control problems.  You have unusual weakness or numbness in your arms or legs.  You develop nausea or vomiting.  You develop abdominal pain.  You feel faint.   This information is not intended to replace advice given to you by your health care  provider. Make sure you discuss any questions you have with your health care provider.   Document Released: 05/17/2005 Document Revised: 06/07/2014 Document Reviewed: 09/18/2013 Elsevier Interactive Patient Education [employer].

## 2015-05-08 NOTE — ED Notes (Signed)
PT reports mid back pain since last week.

## 2015-05-08 NOTE — ED Provider Notes (Signed)
CSN: [id number]     Arrival date & time 05/08/15  2353 History  By signing my name below, I, Hansel Feinstein, attest that this documentation has been prepared under the direction and in the presence of  Comer Locket, PA-C. Electronically Signed: Hansel Feinstein, ED Scribe. 05/08/2015. 10:49 AM.    Chief Complaint  Patient presents with  . Back Pain   The history is provided by the patient. No language interpreter was used.    HPI Comments: Jonathon Moore is a 29 y.o. male who presents to the Emergency Department complaining of moderate, mid back pain onset 3 days ago. Pt states he works at [employer] and does a lot of heavy lifting, especially this time of year. He notes he has not been to work since onset of pain. Pt states pain is worsened with movement. He states mild relief with Bayer ASA. Pt states h/o scoliosis as a young child. He denies bowel or bladder incontinence, saddle anesthesia, numbness, focal weakness or paresthesia of the lower extremities, fever, cough, abdominal pain, dysuria, hematuria, frequency.       Past Medical History  Diagnosis Date  . Hemorrhoids   . Bronchitis    History reviewed. No pertinent past surgical history. History reviewed. No pertinent family history. Social History  Substance Use Topics  . Smoking status: Current Every Day Smoker -- 0.20 packs/day  . Smokeless tobacco: None  . Alcohol Use: Yes     Comment: weekly    Review of Systems A 10 point review of systems was completed and was negative except for pertinent positives and negatives as mentioned in the history of present illness.   Allergies  Review of patient's allergies indicates no known allergies.  Home Medications   Prior to Admission medications   Medication Sig Start Date End Date Taking? Authorizing Provider  predniSONE (DELTASONE) 50 MG tablet Take 1 tablet a day for 5 days 03/25/15  Yes Stevi Barrett, PA-C  ibuprofen (ADVIL,MOTRIN) 600 MG tablet Take 1 tablet (600 mg total)  by mouth every 6 (six) hours as needed. 05/08/15   Harjit Leider, PA-C   BP 118/90 mmHg  Pulse 72  Temp(Src) 98.6 F (37 C)  Resp 16  Wt 77.111 kg  SpO2 97% Physical Exam  Constitutional: He is oriented to person, place, and time. He appears well-developed and well-nourished.  Awake, alert, nontoxic appearance.    HENT:  Head: Normocephalic and atraumatic.  Eyes: Conjunctivae and EOM are normal. Pupils are equal, round, and reactive to light.  Neck: Normal range of motion. Neck supple.  Cardiovascular: Normal rate, regular rhythm and normal heart sounds.  Exam reveals no gallop and no friction rub.   No murmur heard. Heart sounds normal. RRR.   Pulmonary/Chest: Effort normal. No respiratory distress.  Abdominal: Soft. He exhibits no distension. There is no tenderness.  Musculoskeletal: Normal range of motion.  No focal bony tenderness. Maintains full active range of motion of cervical, thoracic and lumbar spine. Tenderness diffusely to left paraspinal lumbar region. No crepitus, lesions or other abnormalities.  Neurological: He is alert and oriented to person, place, and time. No cranial nerve deficit. Coordination normal.  Cranial nerves III through XII grossly intact. Moves all extremities without ataxia. Motor strength 5/5 in all 4 extremities. Sensation intact to light touch. Gait is baseline.    Skin: Skin is warm and dry.  Psychiatric: He has a normal mood and affect. His behavior is normal.  Nursing note and vitals reviewed.  ED Course  Procedures (including critical care time) DIAGNOSTIC STUDIES: Oxygen Saturation is 97% on RA, normal by my interpretation.    COORDINATION OF CARE: 10:45 AM Discussed treatment plan with pt at bedside and pt agreed to plan. Plan for pain management in the ED. Will prescribe Motrin and Ibuprofen. Advised pt to wear a back brace at work.   Meds given in ED:  Medications  ketorolac (TORADOL) injection 60 mg (60 mg Intramuscular Given  05/08/15 1109)  diazepam (VALIUM) tablet 5 mg (5 mg Oral Given 05/08/15 1109)    Discharge Medication List as of 05/08/2015 11:27 AM    START taking these medications   Details  ibuprofen (ADVIL,MOTRIN) 600 MG tablet Take 1 tablet (600 mg total) by mouth every 6 (six) hours as needed., Starting 05/08/2015, Until Discontinued, Print         Filed Vitals:   05/08/15 1001  BP: 118/90  Pulse: 72  Temp: 98.6 F (37 C)  Resp: 16    MDM   Final diagnoses:  Bilateral low back pain without sciatica   Patient with back pain.  No neurological deficits and normal neuro exam.  Patient is ambulatory.  No loss of bowel or bladder control.  No concern for cauda equina. No red flags. No fever, night sweats, weight loss, h/o cancer, IVDA, no recent procedure to back. No urinary symptoms suggestive of UTI. Consistent with lumbosacral strain. Supportive care and return precaution discussed. Appears safe for discharge at this time. Follow up as indicated in discharge paperwork. Pt advised to manage pain symptomatically with Ibuprofen and Motrin, as well as wear a back brace at work when lifting heavy boxes.   I personally performed the services described in this documentation, which was scribed in my presence. The recorded information has been reviewed and is accurate.   Comer Locket, PA-C 05/08/15 1422  Gareth Morgan, MD 05/09/15 [phone]

## 2015-05-08 NOTE — ED Notes (Signed)
Declined W/C at D/C and was escorted to lobby by RN. 

## 2015-05-08 NOTE — ED Notes (Signed)
See PA assessment 

## 2016-01-03 ENCOUNTER — Inpatient Hospital Stay (HOSPITAL_COMMUNITY)
Admission: EM | Admit: 2016-01-03 | Discharge: 2016-01-13 | DRG: 969 | Disposition: A | Discharge status: Home Health | Payer: Medicaid Other | Attending: Student in an Organized Health Care Education/Training Program | Admitting: Student in an Organized Health Care Education/Training Program

## 2016-01-03 ENCOUNTER — Inpatient Hospital Stay (HOSPITAL_COMMUNITY): Payer: Medicaid Other

## 2016-01-03 ENCOUNTER — Emergency Department (HOSPITAL_COMMUNITY): Payer: Medicaid Other

## 2016-01-03 ENCOUNTER — Encounter (HOSPITAL_COMMUNITY): Payer: Self-pay

## 2016-01-03 DIAGNOSIS — N269 Renal sclerosis, unspecified: Secondary | ICD-10-CM | POA: Diagnosis present

## 2016-01-03 DIAGNOSIS — D631 Anemia in chronic kidney disease: Secondary | ICD-10-CM | POA: Diagnosis present

## 2016-01-03 DIAGNOSIS — R0602 Shortness of breath: Secondary | ICD-10-CM | POA: Diagnosis present

## 2016-01-03 DIAGNOSIS — R809 Proteinuria, unspecified: Secondary | ICD-10-CM | POA: Diagnosis present

## 2016-01-03 DIAGNOSIS — R3129 Other microscopic hematuria: Secondary | ICD-10-CM | POA: Diagnosis present

## 2016-01-03 DIAGNOSIS — F1290 Cannabis use, unspecified, uncomplicated: Secondary | ICD-10-CM | POA: Diagnosis present

## 2016-01-03 DIAGNOSIS — F32A Depression, unspecified: Secondary | ICD-10-CM

## 2016-01-03 DIAGNOSIS — E872 Acidosis, unspecified: Secondary | ICD-10-CM | POA: Diagnosis present

## 2016-01-03 DIAGNOSIS — N289 Disorder of kidney and ureter, unspecified: Secondary | ICD-10-CM

## 2016-01-03 DIAGNOSIS — F17210 Nicotine dependence, cigarettes, uncomplicated: Secondary | ICD-10-CM | POA: Diagnosis present

## 2016-01-03 DIAGNOSIS — N185 Chronic kidney disease, stage 5: Secondary | ICD-10-CM | POA: Diagnosis present

## 2016-01-03 DIAGNOSIS — D649 Anemia, unspecified: Secondary | ICD-10-CM | POA: Diagnosis present

## 2016-01-03 DIAGNOSIS — R062 Wheezing: Secondary | ICD-10-CM

## 2016-01-03 DIAGNOSIS — N179 Acute kidney failure, unspecified: Secondary | ICD-10-CM

## 2016-01-03 DIAGNOSIS — R509 Fever, unspecified: Secondary | ICD-10-CM

## 2016-01-03 DIAGNOSIS — F329 Major depressive disorder, single episode, unspecified: Secondary | ICD-10-CM | POA: Diagnosis present

## 2016-01-03 DIAGNOSIS — K089 Disorder of teeth and supporting structures, unspecified: Secondary | ICD-10-CM

## 2016-01-03 DIAGNOSIS — Z21 Asymptomatic human immunodeficiency virus [HIV] infection status: Secondary | ICD-10-CM | POA: Diagnosis present

## 2016-01-03 DIAGNOSIS — M869 Osteomyelitis, unspecified: Secondary | ICD-10-CM

## 2016-01-03 DIAGNOSIS — R0609 Other forms of dyspnea: Secondary | ICD-10-CM | POA: Diagnosis present

## 2016-01-03 DIAGNOSIS — R51 Headache: Secondary | ICD-10-CM

## 2016-01-03 DIAGNOSIS — R059 Cough, unspecified: Secondary | ICD-10-CM

## 2016-01-03 DIAGNOSIS — E781 Pure hyperglyceridemia: Secondary | ICD-10-CM | POA: Diagnosis present

## 2016-01-03 DIAGNOSIS — B20 Human immunodeficiency virus [HIV] disease: Secondary | ICD-10-CM

## 2016-01-03 DIAGNOSIS — N19 Unspecified kidney failure: Secondary | ICD-10-CM

## 2016-01-03 DIAGNOSIS — K0530 Chronic periodontitis, unspecified: Secondary | ICD-10-CM | POA: Diagnosis present

## 2016-01-03 DIAGNOSIS — N170 Acute kidney failure with tubular necrosis: Secondary | ICD-10-CM | POA: Diagnosis present

## 2016-01-03 DIAGNOSIS — K0510 Chronic gingivitis, plaque induced: Secondary | ICD-10-CM | POA: Diagnosis present

## 2016-01-03 DIAGNOSIS — A523 Neurosyphilis, unspecified: Secondary | ICD-10-CM | POA: Diagnosis present

## 2016-01-03 DIAGNOSIS — R634 Abnormal weight loss: Secondary | ICD-10-CM | POA: Diagnosis present

## 2016-01-03 DIAGNOSIS — Z8619 Personal history of other infectious and parasitic diseases: Secondary | ICD-10-CM

## 2016-01-03 DIAGNOSIS — N08 Glomerular disorders in diseases classified elsewhere: Secondary | ICD-10-CM

## 2016-01-03 DIAGNOSIS — R519 Headache, unspecified: Secondary | ICD-10-CM

## 2016-01-03 DIAGNOSIS — R197 Diarrhea, unspecified: Secondary | ICD-10-CM | POA: Diagnosis present

## 2016-01-03 DIAGNOSIS — R05 Cough: Secondary | ICD-10-CM

## 2016-01-03 DIAGNOSIS — B59 Pneumocystosis: Secondary | ICD-10-CM | POA: Diagnosis present

## 2016-01-03 DIAGNOSIS — E8809 Other disorders of plasma-protein metabolism, not elsewhere classified: Secondary | ICD-10-CM

## 2016-01-03 HISTORY — DX: Human immunodeficiency virus (HIV) disease: B20

## 2016-01-03 HISTORY — DX: Asymptomatic human immunodeficiency virus (hiv) infection status: Z21

## 2016-01-03 LAB — RENAL FUNCTION PANEL
ANION GAP: 6 (ref 5–15)
BUN: 55 mg/dL — ABNORMAL HIGH (ref 6–20)
CALCIUM: 7.2 mg/dL — AB (ref 8.9–10.3)
CO2: 13 mmol/L — ABNORMAL LOW (ref 22–32)
Chloride: 122 mmol/L — ABNORMAL HIGH (ref 101–111)
Creatinine, Ser: 7.92 mg/dL — ABNORMAL HIGH (ref 0.61–1.24)
GFR calc non Af Amer: 8 mL/min — ABNORMAL LOW (ref >60)
GFR, EST AFRICAN AMERICAN: 10 mL/min — AB (ref >60)
Glucose, Bld: 87 mg/dL (ref 65–99)
PHOSPHORUS: 4.5 mg/dL (ref 2.5–4.6)
Potassium: 4.6 mmol/L (ref 3.5–5.1)
SODIUM: 141 mmol/L (ref 135–145)

## 2016-01-03 LAB — PROTEIN / CREATININE RATIO, URINE
Creatinine, Urine: 170.82 mg/dL
Total Protein, Urine: >3000 mg/dL

## 2016-01-03 LAB — URINALYSIS, ROUTINE W REFLEX MICROSCOPIC
BILIRUBIN URINE: NEGATIVE
Glucose, UA: NEGATIVE mg/dL
KETONES UR: NEGATIVE mg/dL
LEUKOCYTES UA: NEGATIVE
Nitrite: NEGATIVE
Specific Gravity, Urine: 1.027 (ref 1.005–1.030)
pH: 5.5 (ref 5.0–8.0)

## 2016-01-03 LAB — CBC
HCT: 32.2 % — ABNORMAL LOW (ref 39.0–52.0)
Hemoglobin: 10.7 g/dL — ABNORMAL LOW (ref 13.0–17.0)
MCH: 29.4 pg (ref 26.0–34.0)
MCHC: 33.2 g/dL (ref 30.0–36.0)
MCV: 88.5 fL (ref 78.0–100.0)
PLATELETS: 336 10*3/uL (ref 150–400)
RBC: 3.64 MIL/uL — ABNORMAL LOW (ref 4.22–5.81)
RDW: 15 % (ref 11.5–15.5)
WBC: 8.5 10*3/uL (ref 4.0–10.5)

## 2016-01-03 LAB — LIPID PANEL
Cholesterol: 174 mg/dL (ref 0–200)
HDL: 21 mg/dL — AB (ref >40)
LDL Cholesterol: 121 mg/dL — ABNORMAL HIGH (ref 0–99)
TRIGLYCERIDES: 159 mg/dL — AB (ref <150)
Total CHOL/HDL Ratio: 8.3 RATIO
VLDL: 32 mg/dL (ref 0–40)

## 2016-01-03 LAB — COMPREHENSIVE METABOLIC PANEL
ALT: 19 U/L (ref 17–63)
AST: 26 U/L (ref 15–41)
Albumin: <1 g/dL — ABNORMAL LOW (ref 3.5–5.0)
Alkaline Phosphatase: 129 U/L — ABNORMAL HIGH (ref 38–126)
Anion gap: 4 — ABNORMAL LOW (ref 5–15)
BILIRUBIN TOTAL: 0.4 mg/dL (ref 0.3–1.2)
BUN: 57 mg/dL — AB (ref 6–20)
CHLORIDE: 122 mmol/L — AB (ref 101–111)
CO2: 16 mmol/L — ABNORMAL LOW (ref 22–32)
CREATININE: 8.09 mg/dL — AB (ref 0.61–1.24)
Calcium: 7.3 mg/dL — ABNORMAL LOW (ref 8.9–10.3)
GFR calc Af Amer: 9 mL/min — ABNORMAL LOW (ref >60)
GFR, EST NON AFRICAN AMERICAN: 8 mL/min — AB (ref >60)
Glucose, Bld: 87 mg/dL (ref 65–99)
Potassium: 4.3 mmol/L (ref 3.5–5.1)
Sodium: 142 mmol/L (ref 135–145)
Total Protein: 5.7 g/dL — ABNORMAL LOW (ref 6.5–8.1)

## 2016-01-03 LAB — SEDIMENTATION RATE: SED RATE: 140 mm/h — AB (ref 0–16)

## 2016-01-03 LAB — RAPID STREP SCREEN (MED CTR MEBANE ONLY): Streptococcus, Group A Screen (Direct): NEGATIVE

## 2016-01-03 LAB — LIPASE, BLOOD: Lipase: 25 U/L (ref 11–51)

## 2016-01-03 LAB — URINE MICROSCOPIC-ADD ON

## 2016-01-03 LAB — SODIUM, URINE, RANDOM: Sodium, Ur: 63 mmol/L

## 2016-01-03 LAB — OSMOLALITY, URINE: OSMOLALITY UR: 356 mosm/kg (ref 300–900)

## 2016-01-03 LAB — CREATININE, URINE, RANDOM: Creatinine, Urine: 170.57 mg/dL

## 2016-01-03 LAB — CK: Total CK: 38 U/L — ABNORMAL LOW (ref 49–397)

## 2016-01-03 MED ORDER — SODIUM CHLORIDE 0.9 % IV BOLUS (SEPSIS)
1000.0000 mL | Freq: Once | INTRAVENOUS | Status: AC
  Administered 2016-01-03: 1000 mL via INTRAVENOUS

## 2016-01-03 MED ORDER — HEPARIN SODIUM (PORCINE) 5000 UNIT/ML IJ SOLN
5000.0000 [IU] | Freq: Three times a day (TID) | INTRAMUSCULAR | Status: DC
  Administered 2016-01-03 – 2016-01-04 (×2): 5000 [IU] via SUBCUTANEOUS
  Filled 2016-01-03 (×2): qty 1

## 2016-01-03 MED ORDER — SODIUM BICARBONATE 650 MG PO TABS
1300.0000 mg | ORAL_TABLET | Freq: Three times a day (TID) | ORAL | Status: DC
  Administered 2016-01-03 – 2016-01-13 (×28): 1300 mg via ORAL
  Filled 2016-01-03 (×29): qty 2

## 2016-01-03 NOTE — ED Provider Notes (Signed)
Patient reports a few weeks ago he noted he started having trouble dancing, stating he was getting very short of breath. He reports dyspnea on exertion with minor exertion. He states also for the past 5 weeks he's had diarrhea about 5-6 times a day that he describes as loose and watery. He sometimes has some lower abdominal pain sometimes with the diarrhea but also sometimes without. He denies any fever or chills. He states he feels lightheaded and weak. At one point he stated he had normal urinary output and states it's dark yellow and not tea colored or red. He then states he felt like he is "holding onto my urine". He reports weight loss that is noticeable by his friends and mother.  Patient is a tall thin male who is alert and cooperative. He is lying flat in bed in no respiratory distress. Cardiovascular S1-S2 is normal, his lungs are clear without wheezes rhonchi or rales, he has no scleral icterus.  After reviewing his chest x-ray the PA and I decided to start the patient on IV fluids. His chest x-ray does not look like congestive heart failure.  14:40 PM Dr Silvano Rusk, [facility], accepts in admission to tele, Dr Lalla Brothers attending.    Medical screening examination/treatment/procedure(s) were conducted as a shared visit with non-physician practitioner(s) and myself.  I personally evaluated the patient during the encounter.   EKG Interpretation None      Rolland Porter, MD, Barbette Or, MD 01/03/16 [phone]

## 2016-01-03 NOTE — ED Notes (Signed)
Attempted to call report x 1  

## 2016-01-03 NOTE — ED Notes (Signed)
Nurse on [place] is concerned for patients placement on their floor and will talk with charge nurse and contact me back before bringing patient to [facility]

## 2016-01-03 NOTE — H&P (Signed)
Date: 01/03/2016               Patient Name:  Jonathon Moore MRN: [id number]  DOB: 01-12-1986 Age / Sex: 30 y.o., male   PCP: No Pcp Per Patient         Medical Service: Internal Medicine Teaching Service         Attending Physician: Dr. Axel Filler, MD    First Contact: Dr. Ophelia Shoulder, MD  Pager: [phone]  Second Contact: Dr. Rema Jasmine Pager: [phone]       After Hours (After 5p/  First Contact Pager: [phone]  weekends / holidays): Second Contact Pager: [phone]   Chief Complaint: Shortness of breath, diarrhea and cough  History of Present Illness: Jonathon Moore is a 30 year old male with a past medical history of bronchitis and HIV who has never been on ART therapy. He presented to the [facility] emergency department with a 4-5 week history of dyspnea on exertion, fatigue and associated diarrhea. He complains of being extremely fatigued and getting short of breath when walking up stairs. 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 Additionally, he complains of diarrhea over the same time course. He states he has 3-6 liquid-like stools per day. He describes these as liquidy, green and brownish in appearance. He denies bright red blood in his stool or melena. The patient also states he has lost approximately 20 pounds since the beginning of the year and that his friends and his mother have told him he needs to seek medical attention. In addition to dyspnea on exertion he feels extremely lightheaded and weak when walking. He says that he has felt like he needs to pass out on multiple occasions over the last 3-4 weeks.  The patient states that his friend was sick with diarrhea around the same time but his friends diarrhea resolved within 2-3 days and his has now persisted for 5 weeks. He denies any additional sick contacts. He denies fevers and night sweats. The patient also complained of productive  cough for approximately 1 year. He was diagnosed with bronchitis in the past and attributes this cough to his bronchitis. He has not sought medical attention for this cough and has not tried medications. He denies coughing up blood and describes his sputum is a whitish green color. Additionally, the patient denies any tick exposure, camping, drinking out of rivers or [place] or changes in his dietary habits. He denies chest pain and joint pains.  In the emergency department the patient was hemodynamically stable and in no acute distress. His physical exam was positive for dry mucous membranes. Lipase was normal. Labs were significant for chloride of 122, CO2 16, BUN of 57, creatinine of 8.09, calcium of 7.3, and albumin less than 1. His previous creatinine was 1.08 nine months ago. CBC demonstrated mild anemia with no leukocytosis. Urinalysis was positive for moderate hemoglobin, protein greater than 300 and negative for nitrite or leukocytes. Preliminary EKG read shows sinus rhythm with left axis deviation. The patient was given a 1 L bolus normal saline while in the emergency department.  Meds:   (Not in a hospital admission)   Allergies: Allergies as of 01/03/2016  . (No Known Allergies)   Past Medical History:  Diagnosis Date  . Bronchitis   . Hemorrhoids     Family History: Grandmother with breast cancer. Per the patient no history of autoimmune  condition, renal conditions or pulmonary conditions.   Social History: Smokes minimal cigarettes per day. Endorses marijuana use. Endorses alcohol use. Denies other illicit drug use.  Review of Systems: A complete ROS was negative except as per HPI.   Physical Exam: Blood pressure 127/79, pulse 78, temperature 98.4 F (36.9 C), temperature source Oral, resp. rate 18, SpO2 100 %. Physical Exam  Constitutional: He appears well-developed.  Appeared lethargic and tired  HENT:  Head: Normocephalic and atraumatic.  Cardiovascular: Regular  rhythm.  Exam reveals no gallop and no friction rub.   No murmur heard. Tachycardic  Respiratory: Effort normal. No respiratory distress. He has wheezes. He has no rales.  Faint bilateral wheezing auscultated on examination  GI: Soft. Bowel sounds are normal. He exhibits no distension. There is no tenderness. There is no rebound.  No abdominal bruits auscultated    EKG: Sinus rhythm with left axis deviation  CXR: No active cardiopulmonary abnormalities  Jonathon Moore is a 30 year old male with a past medical history of HIV not on ART therapy who presents with a 4-5 week history of increased fatigue, cough and diarrhea found to have an acute kidney injury and appearing dry on physical examination.   Assessment & Plan by Problem: 1. Acute kidney injury -- Currently the differential diagnosis for the patient's acute kidney injury is broad and includes prerenal azotemia secondary to dehydration in the setting of a 4-5 week history of diarrhea, viral induced nephropathy in a patient with known HIV not on ART, acute tubular necrosis, rhabdomyolysis, vasculitis or other glomerular process.  -- Patient with a creatinine of 8.09 at presentation. Previous creatinine was around 1.Still making urine, denies hematuria. -- Urinalysis demonstrated moderate hemoglobin and protein level over 300, microscopy demonstrates only 6-30 red blood cells per high-powered field -- Renal ultrasound shows markedly echogenic kidneys bilaterally. This finding is nonspecific but may be seen with viral nephropathy -- Patient given a 1 L normal saline fluid bolus in the emergency department, second 1 L normal saline fluid bolus ordered -- Urinalysis, urine sodium, urine creatinine, renal function panel -- CK -- Hep B and Hep C antibodies -- Lipid panel -- C2, C3, C4  -- ESR -- Cryoglobulin -- GBM anti-bodies -- Oral sodium bicarbonate per renal recommendation  2. Diarrhea  -- Patient with 4-5 week history of nonbloody  diarrhea. Currently the differential diagnosis in an HIV-positive patient not on ART is broad and includes viral, bacterial and fungal etiologies. -- CD4 T-helper cell count and HIV RNA viral load will help determine if the patient is immunosuppressed and help guide the differential for this patient's diarrhea -- If CD4 count returns defining the patient as  AIDS will get GI pathogen panel and stool culture  3. Chronic Cough -- HIV patient not on ART complains of cough of one year duration with greenish sputum production. He also states he has a diagnosis of bronchitis in the past and attributes this cough to his bronchitis. Given his HIV status differential diagnosis for his chronic cough is extremely broad but does include atypical organism such as PCP pneumonia. Results of the HIV viral load and CD4 cell count will help differentiating potential infectious etiologies. -- Chest x-ray normal -- Afebrile -- No leukocytosis -- Continue to monitor  3. Non-anion gap metabolic acidosis -- CO2 of 16, sodium 142, chloride 122 -- Differential diagnosis includes renal tubular acidosis and chronic diarrhea -- Renal labs as above -- Repeat basic metabolic panel -- 1 L normal saline fluid bolus  given  4. HIV -- Patient states he was diagnosed with HIV 2-3 years ago and was never taking any antiretroviral therapy. -- He has not followed up with a doctor for this in the past. -- CD4 count and HIV viral load  5. Hypocalcemia  -- Patient with calcium of 7.3 at time of admission. Patient also with hypoalbuminemia -- Corrected calcium of 9.7 -- We'll continue to monitor Dispo: Admit patient to Inpatient with expected length of stay greater than 2 midnights.  Signed: Ophelia Shoulder, MD 01/03/2016, 4:07 PM  Pager: [phone]

## 2016-01-03 NOTE — ED Provider Notes (Signed)
[facility]-EMERGENCY DEPT Provider Note   CSN: [id number] Arrival date & time: 01/03/16  5366  First Provider Contact:  First MD Initiated Contact with Patient 01/03/16 1229        History   Chief Complaint Chief Complaint  Patient presents with  . Shortness of Breath  . Abdominal Pain    HPI Jonathon Moore is a 30 y.o. male.  Jonathon Moore is a 30 y.o. male with h/o HIV not on ART presents to ED with multiple complaints. Patient initially complains of a persistent cough that is progressively worsened. Patient states he has had a cough since 2014 in which she was diagnosed with bronchitis. Over the last couple weeks he says that the cough has worsened and is productive with white yellow phlegm. He has associated wheezing, chest tightness, dyspnea on exertion, stating "after taking a few steps he gets fatigued, winded, and pain in legs." He has associated URI-like symptoms to include nasal congestion, rhinorrhea, and sore throat. He also complains of diarrhea for the last 4 weeks with intermittent abdominal pain. Abdominal pain is cramping in nature and often relieved following defecation or flatness. He states he has approximately 4-5 loose stools daily. He denies blood in the stool. He has had some nausea; however, denies vomiting. No dysuria, difficult to urinating, hematuria, scrotal pain, scrotal swelling, penile pain, penile discharge. He complains of chronic low back pain. He denies lower extremity swelling, fever, dizziness, lightheadedness.      Past Medical History:  Diagnosis Date  . Bronchitis   . Hemorrhoids     Patient Active Problem List   Diagnosis Date Noted  . Acute renal failure (ARF) (HCC) 01/03/2016    History reviewed. No pertinent surgical history.     Home Medications    Prior to Admission medications   Medication Sig Start Date End Date Taking? Authorizing Provider  ibuprofen (ADVIL,MOTRIN) 600 MG tablet Take 1 tablet (600 mg total)  by mouth every 6 (six) hours as needed. 05/08/15   Joycie Peek, PA-C  predniSONE (DELTASONE) 50 MG tablet Take 1 tablet a day for 5 days 03/25/15   Alveta Heimlich, PA-C    Family History No family history on file.  Social History Social History  Substance Use Topics  . Smoking status: Current Every Day Smoker    Packs/day: 0.20  . Smokeless tobacco: Never Used  . Alcohol use Yes     Comment: weekly     Allergies   Review of patient's allergies indicates no known allergies.   Review of Systems Review of Systems  Constitutional: Positive for fatigue.  HENT: Positive for congestion, rhinorrhea and sore throat.   Respiratory: Positive for cough, chest tightness, shortness of breath and wheezing.   Cardiovascular:       B/l leg pain w/ ambulation  Gastrointestinal: Positive for abdominal pain ( intermittent), diarrhea ( x 4 weeks, 4-5 episodes daily) and nausea.  Musculoskeletal: Positive for back pain ( chronic low back).  Allergic/Immunologic: Positive for immunocompromised state. Food allergies: HIV positive.  Neurological: Positive for headaches ( mild).  All other systems reviewed and are negative.    Physical Exam Updated Vital Signs BP 143/99   Pulse 82   Temp 98.4 F (36.9 C) (Oral)   Resp 18   SpO2 100%   Physical Exam  Constitutional: He appears well-developed and well-nourished. No distress.  HENT:  Head: Normocephalic and atraumatic.  Mouth/Throat: Uvula is midline and oropharynx is clear and moist. Mucous membranes are dry (  mild). No trismus in the jaw. No uvula swelling. No oropharyngeal exudate.  Eyes: Conjunctivae and EOM are normal. Pupils are equal, round, and reactive to light. Right eye exhibits no discharge. Left eye exhibits no discharge. No scleral icterus.  Neck: Normal range of motion. Neck supple. No neck rigidity. Normal range of motion present.  Cardiovascular: Normal rate, regular rhythm, normal heart sounds and intact distal pulses.     No murmur heard. Pulmonary/Chest: Effort normal. No respiratory distress. He has wheezes ( diffuse). He has rhonchi ( coarse rhonchi throughout).  Abdominal: Soft. Bowel sounds are normal. There is no tenderness. There is no rebound, no guarding and no CVA tenderness.  Musculoskeletal: Normal range of motion. He exhibits no edema or tenderness.  Lymphadenopathy:    He has no cervical adenopathy.  Neurological: He is alert. Coordination normal.  Skin: Skin is warm and dry. He is not diaphoretic.  Psychiatric: He has a normal mood and affect. His behavior is normal.     ED Treatments / Results  Labs (all labs ordered are listed, but only abnormal results are displayed) Labs Reviewed  COMPREHENSIVE METABOLIC PANEL - Abnormal; Notable for the following:       Result Value   Chloride 122 (*)    CO2 16 (*)    BUN 57 (*)    Creatinine, Ser 8.09 (*)    Calcium 7.3 (*)    Total Protein 5.7 (*)    Albumin <1.0 (*)    Alkaline Phosphatase 129 (*)    GFR calc non Af Amer 8 (*)    GFR calc Af Amer 9 (*)    Anion gap 4 (*)    All other components within normal limits  CBC - Abnormal; Notable for the following:    RBC 3.64 (*)    Hemoglobin 10.7 (*)    HCT 32.2 (*)    All other components within normal limits  URINALYSIS, ROUTINE W REFLEX MICROSCOPIC (NOT AT [facility]) - Abnormal; Notable for the following:    APPearance CLOUDY (*)    Hgb urine dipstick MODERATE (*)    Protein, ur >300 (*)    All other components within normal limits  URINE MICROSCOPIC-ADD ON - Abnormal; Notable for the following:    Squamous Epithelial / LPF 6-30 (*)    Bacteria, UA RARE (*)    All other components within normal limits  RAPID STREP SCREEN (NOT AT [facility])  CULTURE, GROUP A STREP ([place])  LIPASE, BLOOD  T-HELPER CELLS (CD4) COUNT (NOT AT [facility])    EKG  EKG Interpretation None       Radiology Dg Chest 2 View  Result Date: 01/03/2016 CLINICAL DATA:  Shortness of breath and dyspnea on exertion. EXAM:  CHEST  2 VIEW COMPARISON:  03/25/2015 FINDINGS: Normal heart size. There is no pleural effusion or edema. No airspace consolidation identified. The visualized osseous structures are unremarkable. IMPRESSION: No active cardiopulmonary abnormalities Electronically Signed   By: Kerby Moors M.D.   On: 01/03/2016 13:57    Procedures Procedures (including critical care time)  Medications Ordered in ED Medications  sodium chloride 0.9 % bolus 1,000 mL (1,000 mLs Intravenous New Bag/Given 01/03/16 1355)     Initial Impression / Assessment and Plan / ED Course  I have reviewed the triage vital signs and the nursing notes.  Pertinent labs & imaging results that were available during my care of the patient were reviewed by me and considered in my medical decision making (see chart for details).  Clinical Course  Value Comment By Time   Labs concerning for acute renal failure. No anemia present as well. Hemoglobinuria and proteinuria noted in urine.  Roxanna Mew, [place] 08/05 1251  DG Chest 2 View No evidence of vascular congestion, focal consolidation, pleural effusion, or PTX.  Roxanna Mew, [place] 08/05 1427    Patient is afebrile and non-toxic appearing in NAD. His vital signs are remarkable for mildly elevated BP. Physical exam remarkable for diffuse coarse rhonchi and wheezing. No appreciable lower extremity swelling or tenderness.  Labs are concerning for acute renal failure. Potassium wnml. He also has hemoglobinuria and proteinuria. CBC remarkable for new anemia - this may be secondary to ARF. Lipase normal. Will check CD4 counts and CXR. Given ARF patient likely to be admitted. Discussed patient with Dr. Tomi Bamberger who also evaluated patient, agrees with plan.   2:00 PM: Spoke with Dr. Lorrene Reid of Nephrology, greatly appreciated their time and input. Agree to see patient during admission. Consult to hospitalist for admission for ARF.   2:30PM: CXR negative for vascular congestion, low  suspicion for heart failure. IVF initiated. Unclear etiology of ARF - ?dehydration vs. ?secondary to HIV vs. ?nephritis.   2:44 PM: Dr. Tomi Bamberger spoke with hospitalists, greatly appreciated their time and input. Agree to admit patient for further management of ARF.     Final Clinical Impressions(s) / ED Diagnoses   Final diagnoses:  Acute renal failure, unspecified acute renal failure type [facility])    New Prescriptions New Prescriptions   No medications on file     Roxanna Mew, PA-C 01/03/16 [address] [place], PA-C 01/03/16 1447    Rolland Porter, MD 01/03/16 [phone]

## 2016-01-03 NOTE — ED Notes (Signed)
Contacted bed control who stated the patient was supposed to go to [place], nurse on [place] notified that patients placement was confirmed by bed control and would be coming up in the next 10 min.

## 2016-01-03 NOTE — ED Notes (Signed)
Bladder scan 200 ml

## 2016-01-03 NOTE — ED Triage Notes (Signed)
Pt. Has a hx of bronchitis and he believes that his sob is increasing.  He has had these symptoms for a long time.  Also is getting skinnier and would like an explanation for that.  He is also concerned about the inconsistency of his BMS.  Pt. Is alert and oriented X 4   Denies any pain , denies any n/v

## 2016-01-03 NOTE — Progress Notes (Signed)
Patient arrive in the unit accompanied by NT via stretcher. Orientation to the unit given.

## 2016-01-03 NOTE — ED Triage Notes (Addendum)
Pt has vague complaints-- states was staying with a friend "weeks ago" who had the same thing. 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

## 2016-01-04 DIAGNOSIS — D649 Anemia, unspecified: Secondary | ICD-10-CM | POA: Diagnosis present

## 2016-01-04 DIAGNOSIS — R197 Diarrhea, unspecified: Secondary | ICD-10-CM

## 2016-01-04 DIAGNOSIS — R634 Abnormal weight loss: Secondary | ICD-10-CM

## 2016-01-04 DIAGNOSIS — K089 Disorder of teeth and supporting structures, unspecified: Secondary | ICD-10-CM

## 2016-01-04 DIAGNOSIS — N179 Acute kidney failure, unspecified: Secondary | ICD-10-CM

## 2016-01-04 DIAGNOSIS — Z21 Asymptomatic human immunodeficiency virus [HIV] infection status: Secondary | ICD-10-CM

## 2016-01-04 LAB — C4 COMPLEMENT: Complement C4, Body Fluid: 20 mg/dL (ref 14–44)

## 2016-01-04 LAB — COMPREHENSIVE METABOLIC PANEL
ALK PHOS: 112 U/L (ref 38–126)
ALT: 17 U/L (ref 17–63)
AST: 21 U/L (ref 15–41)
Anion gap: 6 (ref 5–15)
BUN: 56 mg/dL — ABNORMAL HIGH (ref 6–20)
CHLORIDE: 120 mmol/L — AB (ref 101–111)
CO2: 14 mmol/L — AB (ref 22–32)
Calcium: 6.9 mg/dL — ABNORMAL LOW (ref 8.9–10.3)
Creatinine, Ser: 7.67 mg/dL — ABNORMAL HIGH (ref 0.61–1.24)
GFR calc non Af Amer: 9 mL/min — ABNORMAL LOW (ref >60)
GFR, EST AFRICAN AMERICAN: 10 mL/min — AB (ref >60)
Glucose, Bld: 102 mg/dL — ABNORMAL HIGH (ref 65–99)
POTASSIUM: 4.4 mmol/L (ref 3.5–5.1)
SODIUM: 140 mmol/L (ref 135–145)
Total Bilirubin: 0.4 mg/dL (ref 0.3–1.2)
Total Protein: 5.2 g/dL — ABNORMAL LOW (ref 6.5–8.1)

## 2016-01-04 LAB — CBC WITH DIFFERENTIAL/PLATELET
Basophils Absolute: 0.1 10*3/uL (ref 0.0–0.1)
Basophils Relative: 1 %
EOS ABS: 0.4 10*3/uL (ref 0.0–0.7)
EOS PCT: 5 %
HCT: 30.5 % — ABNORMAL LOW (ref 39.0–52.0)
Hemoglobin: 10.2 g/dL — ABNORMAL LOW (ref 13.0–17.0)
LYMPHS ABS: 2.6 10*3/uL (ref 0.7–4.0)
Lymphocytes Relative: 31 %
MCH: 29.3 pg (ref 26.0–34.0)
MCHC: 33.4 g/dL (ref 30.0–36.0)
MCV: 87.6 fL (ref 78.0–100.0)
MONO ABS: 1.1 10*3/uL — AB (ref 0.1–1.0)
Monocytes Relative: 13 %
NEUTROS PCT: 50 %
Neutro Abs: 4.3 10*3/uL (ref 1.7–7.7)
PLATELETS: 285 10*3/uL (ref 150–400)
RBC: 3.48 MIL/uL — AB (ref 4.22–5.81)
RDW: 15 % (ref 11.5–15.5)
WBC: 8.5 10*3/uL (ref 4.0–10.5)

## 2016-01-04 LAB — RENAL FUNCTION PANEL
Albumin: <1 g/dL — ABNORMAL LOW (ref 3.5–5.0)
Anion gap: 6 (ref 5–15)
BUN: 52 mg/dL — ABNORMAL HIGH (ref 6–20)
CHLORIDE: 118 mmol/L — AB (ref 101–111)
CO2: 16 mmol/L — ABNORMAL LOW (ref 22–32)
Calcium: 7.1 mg/dL — ABNORMAL LOW (ref 8.9–10.3)
Creatinine, Ser: 7.68 mg/dL — ABNORMAL HIGH (ref 0.61–1.24)
GFR, EST AFRICAN AMERICAN: 10 mL/min — AB (ref >60)
GFR, EST NON AFRICAN AMERICAN: 9 mL/min — AB (ref >60)
Glucose, Bld: 78 mg/dL (ref 65–99)
POTASSIUM: 4.4 mmol/L (ref 3.5–5.1)
Phosphorus: 3.6 mg/dL (ref 2.5–4.6)
SODIUM: 140 mmol/L (ref 135–145)

## 2016-01-04 LAB — HCV COMMENT:

## 2016-01-04 LAB — CBC
HEMATOCRIT: 27.3 % — AB (ref 39.0–52.0)
HEMOGLOBIN: 9 g/dL — AB (ref 13.0–17.0)
MCH: 29.3 pg (ref 26.0–34.0)
MCHC: 33 g/dL (ref 30.0–36.0)
MCV: 88.9 fL (ref 78.0–100.0)
Platelets: 249 10*3/uL (ref 150–400)
RBC: 3.07 MIL/uL — AB (ref 4.22–5.81)
RDW: 14.8 % (ref 11.5–15.5)
WBC: 8.5 10*3/uL (ref 4.0–10.5)

## 2016-01-04 LAB — PROTIME-INR
INR: 1.11
PROTHROMBIN TIME: 14.4 s (ref 11.4–15.2)

## 2016-01-04 LAB — PLATELET FUNCTION ASSAY: Collagen / Epinephrine: 107 seconds (ref 0–193)

## 2016-01-04 LAB — C DIFFICILE QUICK SCREEN W PCR REFLEX
C DIFFICILE (CDIFF) TOXIN: NEGATIVE
C DIFFICLE (CDIFF) ANTIGEN: NEGATIVE
C Diff interpretation: NOT DETECTED

## 2016-01-04 LAB — C3 COMPLEMENT: C3 Complement: 120 mg/dL (ref 82–167)

## 2016-01-04 LAB — CALCIUM, IONIZED: CALCIUM, IONIZED, SERUM: 4.4 mg/dL — AB (ref 4.5–5.6)

## 2016-01-04 LAB — HEPATITIS C ANTIBODY (REFLEX): HCV Ab: <0.1 s/co ratio (ref 0.0–0.9)

## 2016-01-04 LAB — COMPLEMENT, TOTAL: Compl, Total (CH50): >60 U/mL — ABNORMAL HIGH (ref 42–60)

## 2016-01-04 LAB — HEPATITIS B CORE ANTIBODY, TOTAL: Hep B Core Total Ab: NEGATIVE

## 2016-01-04 LAB — APTT: aPTT: 48 seconds — ABNORMAL HIGH (ref 24–36)

## 2016-01-04 LAB — HEPATITIS B SURFACE ANTIBODY, QUANTITATIVE: HEPATITIS B-POST: 93.1 m[IU]/mL

## 2016-01-04 LAB — HEPATITIS B SURFACE ANTIGEN: HEP B S AG: NEGATIVE

## 2016-01-04 MED ORDER — ACETAMINOPHEN 325 MG PO TABS
650.0000 mg | ORAL_TABLET | Freq: Four times a day (QID) | ORAL | Status: DC | PRN
Start: 2016-01-04 — End: 2016-01-06
  Administered 2016-01-04 – 2016-01-06 (×6): 650 mg via ORAL
  Filled 2016-01-04 (×6): qty 2

## 2016-01-04 MED ORDER — PHENOL 1.4 % MT LIQD
1.0000 | OROMUCOSAL | Status: DC | PRN
  Filled 2016-01-04 (×2): qty 177

## 2016-01-04 NOTE — Progress Notes (Signed)
RN called regarding concern for the need of airborne precaution for patient as we ordered AFB and other labs.  I discussed that I am mostly concerned about MAI rather than MTB. I asked her to put the patient on droplet isolation for now but I don't think he needs airborne precaution right now.   Confirmed this with Dr. Megan Salon of ID.

## 2016-01-04 NOTE — Consult Note (Signed)
[facility] Consultation Note Requesting Physician:  Dr. Lovena Le, Internal Medicine Teaching Service Reason for Consult:  Renal failure  HPI: The patient is a 30 y.o. year-old [place] man with PMH of bronchitis and HIV diagnosed in 2014 which has never been treated. 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 He also indicated he had several weeks of loose stools 5 or 6 times a day.  Says has lost weight but not sure how much. In the ED was found to have a creatinine of 8.09, bicarb of 16, serum albumin <1, UA with >300 protein, 6-30 RBC's. Renal ultrasound ->12.5, 12.7 cm kidneys, echodense.  He was given a liter of NS in the ED and then admitted for further evaluation. Labs that have returned so far->neg Hep B,C, ESR 140.  ANCA, ANA, complements, cryoglobulins are all pending. HIV-1 RNA also pending. We are asked to see.  He denies any change in UOP, tea or coca colored urine, hematuria or foaminess to his urine. Denies LE edema.  Not currently using any street drugs but in the past has "shot up meth". Smokes cigarettes. No FH of renal ds.  Creatinine summary for reference is as follows: Creatinine, Ser  Date/Time Value Ref Range Status  01/04/2016 10:38 AM 7.68 (H) 0.61 - 1.24 mg/dL Final  01/04/2016 05:05 AM 7.67 (H) 0.61 - 1.24 mg/dL Final  01/03/2016 03:49 PM 7.92 (H) 0.61 - 1.24 mg/dL Final  01/03/2016 10:46 AM 8.09 (H) 0.61 - 1.24 mg/dL Final  03/25/2015 11:19 AM 1.08 0.61 - 1.24 mg/dL Final  12/10/2013 03:15 PM 1.06 0.50 - 1.35 mg/dL Final  11/16/2013 02:00 PM 1.05 0.50 - 1.35 mg/dL Final  05/19/2013 07:05 AM 1.11 0.50 - 1.35 mg/dL Final  05/17/2013 01:29 AM 1.34 0.50 - 1.35 mg/dL Final     Past Medical History:  Diagnosis Date  . Bronchitis   . Hemorrhoids   . HIV (human immunodeficiency virus infection) ([place])     Past Surgical History: History reviewed. No pertinent surgical  history.  Family History: History reviewed. No pertinent family history. Social History:  reports that he has been smoking.  He has been smoking about 0.20 packs per day. He has never used smokeless tobacco. He reports that he drinks alcohol. He reports that he uses drugs, including Marijuana.  Allergies: No Known Allergies  Home medications: Prior to Admission medications   Medication Sig Start Date End Date Taking? Authorizing Provider  ibuprofen (ADVIL,MOTRIN) 200 MG tablet Take 400 mg by mouth 2 (two) times daily as needed for headache or moderate pain.   Yes Historical Provider, MD    Inpatient medications: . sodium bicarbonate  1,300 mg Oral TID    Review of Systems Gen:  + headache, + night sweats + weight loss HEENT:  No visual change, sore throat, difficulty swallowing. Resp:  + DOE, + cough + yellow phlegm No hemoptysis Cardiac:  No chest pain, orthopnea, PND.  Denies edema. GI:   + lower abd pain with diarrhea. No nausea or anorexia.  GU:  Denies difficulty or change in voiding.  No change in urine color.     MS:  Denies joint pain or swelling.   Derm:  Denies skin rash or itching.  No chronic skin conditions.  Neuro:   Denies focal weakness, memory problems, hx stroke or TIA.   Psych:  Denies symptoms of depression of anxiety.  No  hallucination.    Physical Exam:  Blood pressure 136/78, pulse 95, temperature (!) 100.6 F (38.1 C), temperature source Oral, resp. rate 18, weight 75.7 kg (166 lb 12.8 oz), SpO2 98 %.  Gen: Very soft spoken and reserved young AAM VS as noted Skin: no rash, cyanosis Neck: no JVD, no bruits or LAN Chest: Clear to auscultation Heart: Prominent precordial impulse S1S2 normal No S3. 1/6 systolic murmur LSB  No diastolic murmur or rub Abdomen: soft, + bs. No focal abdominal tenderness. Ext: No edema whatsoever Neuro: alert, Ox3  Labs:   Recent Labs Lab 01/03/16 1046 01/03/16 1549 01/04/16 0505 01/04/16 1038  NA 142 141 140 140  K  4.3 4.6 4.4 4.4  CL 122* 122* 120* 118*  CO2 16* 13* 14* 16*  GLUCOSE 87 87 102* 78  BUN 57* 55* 56* 52*  CREATININE 8.09* 7.92* 7.67* 7.68*  CALCIUM 7.3* 7.2* 6.9* 7.1*  PHOS  --  4.5  --  3.6     Recent Labs Lab 01/03/16 1046 01/03/16 1549 01/04/16 0505 01/04/16 1038  AST 26  --  21  --   ALT 19  --  17  --   ALKPHOS 129*  --  112  --   BILITOT 0.4  --  0.4  --   PROT 5.7*  --  5.2*  --   ALBUMIN <1.0* <1.0* <1.0* <1.0*    Recent Labs Lab 01/03/16 1046  LIPASE 25     Recent Labs Lab 01/03/16 1046 01/04/16 1038  WBC 8.5 8.5  NEUTROABS  --  PENDING  HGB 10.7* 10.2*  HCT 32.2* 30.5*  MCV 88.5 87.6  PLT 336 285     Recent Labs Lab 01/03/16 1549  CKTOTAL 38*   ANCA, anti GBM, ANA, complements, cryoglobulins, RPR, HIV-1 RNA all pending.  Xrays/Other Studies: Dg Chest 2 View  Result Date: 01/03/2016 CLINICAL DATA:  Shortness of breath and dyspnea on exertion. EXAM: CHEST  2 VIEW COMPARISON:  03/25/2015 FINDINGS: Normal heart size. There is no pleural effusion or edema. No airspace consolidation identified. The visualized osseous structures are unremarkable. IMPRESSION: No active cardiopulmonary abnormalities Electronically Signed   By: Kerby Moors M.D.   On: 01/03/2016 13:57   US Renal  Result Date: 01/03/2016 CLINICAL DATA:  Acute kidney injury.  HIV infection EXAM: RENAL / URINARY TRACT ULTRASOUND COMPLETE COMPARISON:  11/16/2013 FINDINGS: Right Kidney: Length: 12.5 cm. Diffuse increased parenchymal echogenicity. No mass or hydronephrosis visualized. Left Kidney: Length: 12.7 cm. Diffusely increased parenchymal echogenicity. No mass or hydronephrosis visualized. Bladder: Appears normal for degree of bladder distention. IMPRESSION: 1. No obstructive uropathy. 2. Markedly echogenic kidneys are identified bilaterally. The appearance is nonspecific but may be seen with viral nephropathy. Electronically Signed   By: Kerby Moors M.D.   On: 01/03/2016 16:02     Background: 30 y.o. year-old AA man with PMH of bronchitis and HIV diagnosed in 2014 which has never been treated. 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 In the ED he was found to have a creatinine of 8.09, bicarb of 16, serum albumin <1, UA with >300 protein, 6-30 RBC's. Renal ultrasound ->12.5, 12.7 cm kidneys, echodense.  He was given a liter of NS in the ED and then admitted for further evaluation. Labs that have  returned so far->neg Hep B,C, ESR 140. ANCA, ANA, complements, cryoglobulins are all pending. HIV-1 RNA also pending. We are asked to see.  Assessment/Recommendations  1. Renal failure with proteinuria/hematuria - Normal renal function 03/2015 with creatinine of 1.08 at that time - presents with creatinine of 8 and proteinuria/microscopic hematuria (UPC could not be calculated d/t protein concentration ">3000") in the setting of untreated HIV. HIVAN and in particular collapsing FSGS highest on my differential, but broad ddx (HIVAN, HIVICK, plus other GN's). Multiple studies pending.  1. Needs a diagnostic renal biopsy.  Will enter order and I have atemtped to call, but no one in radiology available to schedule the procedure until 7:30 AM tomorrow. In the meantime will check coags and PFA.  2. No uremic symptoms at this time, but very close to needing dialysis and with current advanced renal failure, not sure how much improvement we could reasonably expect with treatment of his HIV. 2. Metabolic acidosis - renal failure.  1. Primary team has started oral sodium bicarbonate.  3. Anemia - likely related to CKD.  1. Chek Fe studies 2. Aranesp or EPO not yet required with Hb >10 4. HIV - Since 2014. Untreated (he says due to cost). Family is not aware of his diagnosis 5. Diarrhea, weight loss - stools for CDiff,  O and P, AFB  ordered (but no stool sent as of yet) 6. Fever  - blood cultures, quant gold, stool studies al ordered/ pending. No ATB's at this time.  Jamal Maes,  MD [facility] [phone] pager 01/04/2016, 1:01 PM

## 2016-01-04 NOTE — Progress Notes (Signed)
Paged by nurse concerning bleeding from his mouth. I saw the patient and he has extremely severe periodontal disease with mobil teeth and mucosal bleeding from his gums. There were no additional sources for the patients bleeding. I was able to visualize the posterior pharyngeal wall on examination and did not appreciate any bleeding from the oropharynx.   -- CBC ordered for 1800 -- D/C prophylactic heparin -- SCD's

## 2016-01-04 NOTE — Consult Note (Signed)
[place] for Infectious Disease    Date of Admission:  01/03/2016          Reason for Consult: Untreated HIV infection, diarrhea, acute renal insufficiency and unintentional weight loss    Referring Physician: Dr. Ophelia Shoulder Primary Care Physician: None  Principal Problem:   HIV (human immunodeficiency virus infection) ([place]) Active Problems:   Acute renal failure ([place])   Diarrhea   Metabolic acidosis   Shortness of breath   Normocytic anemia   Unintentional weight loss   Poor dentition   . sodium bicarbonate  1,300 mg Oral TID    Recommendations: 1. Stool for AFB stain and culture, routine culture, ova and parasite, and C. difficile screen 2. 1 AFB blood culture 3. Await results of CD4, HIV viral load and genotype resistance assay 4. Urine for GC and Chlamydia 5. QuantiFERON gold TB assay 6. Hepatitis A antibody 7. HLA [id number]  Assessment: Jonathon Moore has untreated HIV infection that is probably advanced based on his recent diarrhea and weight loss. 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 Difficile, and less likely routine enteric pathogens. I would not start any empiric therapy now would consider him avium therapy soon if his CD4 count is very low. I would not start antiretroviral therapy yet because of the possibility of immune reconstitution inflammatory syndrome. He will be a candidate to start it soon because of probable opportunistic infection and renal insufficiency. He will need an emergency supply pending ADAP certification. We will follow with you.   HPI: Jonathon Moore is a 30 y.o. male with a history of HIV infection who began to develop diarrhea, abdominal pain, anorexia and unintentional weight loss of about one month ago. He has lost 15 pounds. He has had subjective fevers. He  has a chronic smoker's cough and a history of asthmatic bronchitis. He has been a little more short of breath lately than normal. He tells me that he was diagnosed with HIV at a club several years ago but has never heard been in care or on treatment for HIV. His mother was at the bedside when I first entered the room but I asked her to leave. He was still reluctant to talk to me about his HIV infection because she was standing just outside the door. He tells me that no one else is aware of his HIV infection. He is currently not working. He lives here in [place] with a friend. He has no health insurance.   Review of Systems: Review of Systems  Constitutional: Positive for fever, malaise/fatigue and weight loss. Negative for diaphoresis.  HENT: Negative for sore throat.        Mild intermittent headaches.  Eyes: Negative for blurred vision.  Respiratory: Positive for cough, sputum production, shortness of breath and wheezing.   Cardiovascular: Negative for chest pain.  Gastrointestinal: Positive for abdominal pain and diarrhea. Negative for constipation, heartburn, nausea and vomiting.  Genitourinary: Negative for dysuria.  Musculoskeletal: Negative for joint pain and myalgias.  Skin: Negative for rash.  Neurological: Positive for weakness and headaches. Negative for dizziness and sensory change.    Past Medical History:  Diagnosis Date  . Bronchitis   . Hemorrhoids   . HIV (human immunodeficiency virus infection) ([place])     Social History  Substance Use Topics  . Smoking status: Current  Every Day Smoker    Packs/day: 0.20  . Smokeless tobacco: Never Used  . Alcohol use Yes     Comment: weekly    History reviewed. No pertinent family history. No Known Allergies  OBJECTIVE: Blood pressure 136/78, pulse 95, temperature (!) 100.6 F (38.1 C), temperature source Oral, resp. rate 18, weight 166 lb 12.8 oz (75.7 kg), SpO2 98 %.  Physical Exam  Lab Results Lab Results  Component  Value Date   WBC 8.5 01/04/2016   HGB 10.2 (L) 01/04/2016   HCT 30.5 (L) 01/04/2016   MCV 87.6 01/04/2016   PLT 285 01/04/2016    Lab Results  Component Value Date   CREATININE 7.68 (H) 01/04/2016   BUN 52 (H) 01/04/2016   NA 140 01/04/2016   K 4.4 01/04/2016   CL 118 (H) 01/04/2016   CO2 16 (L) 01/04/2016    Lab Results  Component Value Date   ALT 17 01/04/2016   AST 21 01/04/2016   ALKPHOS 112 01/04/2016   BILITOT 0.4 01/04/2016     Microbiology: Recent Results (from the past 240 hour(s))  Rapid strep screen     Status: None   Collection Time: 01/03/16  1:31 PM  Result Value Ref Range Status   Streptococcus, Group A Screen (Direct) NEGATIVE NEGATIVE Final    Comment: (NOTE) A Rapid Antigen test may result negative if the antigen level in the sample is below the detection level of this test. The FDA has not cleared this test as a stand-alone test therefore the rapid antigen negative result has reflexed to a Group A Strep culture.     Michel Bickers, MD [facility] [phone] pager   [phone] cell 01/04/2016, 1:02 PM

## 2016-01-04 NOTE — Progress Notes (Signed)
Subjective: No acute events overnight. Patient continues to have several episodes of diarrhea. He stated he noted some very mild blood streaking on his toilet paper this morning. He continues to endorse cough with sputum production.  Patient with fever curve trending upward with this most recent temperature 38.1C. He has no additional acute complaints or concerns this morning.  Objective:  Vital signs in last 24 hours: Vitals:   01/03/16 1500 01/03/16 1634 01/03/16 2207 01/04/16 0425  BP: 131/82 (!) 148/98 (!) 149/89 136/78  Pulse: 79 82 93 95  Resp:  18 18 18   Temp:  98.6 F (37 C) 100.3 F (37.9 C) (!) 100.6 F (38.1 C)  TempSrc:  Oral Oral Oral  SpO2: 100% 100% 100% 98%  Weight:    166 lb 12.8 oz (75.7 kg)   .Physical Exam  Constitutional: He is oriented to person, place, and time.  Appeared lethargic this morning.  HENT:  Head: Normocephalic and atraumatic.  Extremely poor oral dentition and severe gingivitis  Cardiovascular: Regular rhythm.  Exam reveals no gallop and no friction rub.   No murmur heard. Tachycardic on examination this morning.  Respiratory: Effort normal. He has wheezes.  Faint bilateral wheezes appreciated most predominantly the bilateral upper lung lobes  GI: Soft. Bowel sounds are normal. He exhibits no distension. There is no tenderness. There is no rebound.  Neurological: He is alert and oriented to person, place, and time.  Skin: Skin is warm and dry.  Large tattoo on the patient's back over the cervical spine area     Assessment/Plan:  Jonathon Moore is a 30 year old male with a past medical history of HIV not on ART therapy who presents with a 4-5 week history of increased fatigue, cough and diarrhea found to have an acute kidney injury now febrile to 38.1C.  Assessment & Plan by Problem: 1. Acute kidney injury -- The differential diagnosis for the patient's renal failure includes HIV-associated nephropathy, focal segmental  glomerulosclerosis or other glomerular process. Acute tubular necrosis is less likely at this time given no casts on microscopy. Additionally, prerenal etiology is less likely without improvement following IV fluids. Although HIV-associated nephropathy and a FSGS are most likely in a patient with this presentation not on antiretroviral therapy we will order additional renal labs to ensure thoroughness. -- FeNa greater than 2% indicating intrinsic renal pathology -- Hep B and Hep C antibodies- both negative -- Lipid panel- hypertriglyceridemia at 159, elevated triglycerides are consistent with nephrotic range proteinuria -- ESR elevated at 140 -- Oral sodium bicarbonate per renal recommendation, will continue 1300 mg 3 times a day -- Daily renal function panel -- Nephrology consulted and will see patient today and make decision on renal biopsy -- CK -- C2, C3, C4  -- Cryoglobulin -- GBM anti-bodies   2. Diarrhea  -- Patient with 4-5 week history of nonbloody diarrhea. Currently the differential diagnosis in an HIV-positive patient not on ART is broad and includes viral, bacterial and fungal etiologies. Additionally, opportunistic infections such as Giardia, MAC and CMV. -- CD4 T-helper cell count and HIV RNA viral load will help determine if the patient is immunosuppressed and help guide the differential for this patient's diarrhea -- Acid-fast smear of stool -- C. difficile quick scan with PCR -- Stool ova and parasites -- Infectious diseases consult it and will see patient this afternoon  3. Chronic Cough -- HIV patient not on ART complains of cough of one year duration with greenish sputum production. He also states  he has a diagnosis of bronchitis in the past and attributes this cough to his bronchitis. Given his HIV status differential diagnosis for his chronic cough is extremely broad but does include atypical organism such as PCP pneumonia. Results of the HIV viral load and CD4 cell  count will help differentiating potential infectious etiologies. -- Chest x-ray normal -- No leukocytosis most recent white cell count of 8.5 -- Patient now febrile  3. Non-anion gap metabolic acidosis -- CO2 of 14, sodium 140, chloride 120 -- Differential diagnosis includes renal tubular acidosis and chronic diarrhea -- Renal labs as above -- Oral sodium bicarbonate 1200 mg 3 times a day -- Nephrology consulted and currently following  4. HIV -- Patient states he was diagnosed with HIV 2-3 years ago and was never taking any antiretroviral therapy. -- He has not followed up with a doctor for this in the past. -- CD4 count and HIV viral load -- Infectious diseases consult   5. Hypocalcemia  -- Current serum calcium of 6.9 -- Albumin less than 1.0 -- Corrected calcium of 8.9 using an albumin as 1.0, this correction most likely will overestimate the patient's calcium level given the patient's albumin is less than 1.0  6. Fever -- Patient afebrile on admission now temperature of 38.1C -- Normal chest x-ray -- Urinalysis not suspicious for infection -- Blood cultures ordered Dispo: Anticipated discharge in approximately 3-5 day(s).   LOS: 1 day   Ophelia Shoulder, MD 01/04/2016, 10:12 AM Pager: [phone]

## 2016-01-05 ENCOUNTER — Inpatient Hospital Stay (HOSPITAL_COMMUNITY): Payer: Medicaid Other

## 2016-01-05 ENCOUNTER — Encounter (HOSPITAL_COMMUNITY): Payer: Self-pay | Admitting: Physician Assistant

## 2016-01-05 DIAGNOSIS — B20 Human immunodeficiency virus [HIV] disease: Secondary | ICD-10-CM

## 2016-01-05 DIAGNOSIS — K0889 Other specified disorders of teeth and supporting structures: Secondary | ICD-10-CM

## 2016-01-05 DIAGNOSIS — N08 Glomerular disorders in diseases classified elsewhere: Secondary | ICD-10-CM

## 2016-01-05 DIAGNOSIS — F329 Major depressive disorder, single episode, unspecified: Secondary | ICD-10-CM

## 2016-01-05 DIAGNOSIS — N179 Acute kidney failure, unspecified: Secondary | ICD-10-CM

## 2016-01-05 DIAGNOSIS — F32A Depression, unspecified: Secondary | ICD-10-CM

## 2016-01-05 DIAGNOSIS — N19 Unspecified kidney failure: Secondary | ICD-10-CM

## 2016-01-05 LAB — CULTURE, GROUP A STREP (THRC)

## 2016-01-05 LAB — CBC WITH DIFFERENTIAL/PLATELET
BASOS ABS: 0 10*3/uL (ref 0.0–0.1)
Basophils Relative: 0 %
EOS PCT: 5 %
Eosinophils Absolute: 0.4 10*3/uL (ref 0.0–0.7)
HEMATOCRIT: 27.8 % — AB (ref 39.0–52.0)
HEMOGLOBIN: 9.2 g/dL — AB (ref 13.0–17.0)
LYMPHS PCT: 27 %
Lymphs Abs: 1.9 10*3/uL (ref 0.7–4.0)
MCH: 29.4 pg (ref 26.0–34.0)
MCHC: 33.1 g/dL (ref 30.0–36.0)
MCV: 88.8 fL (ref 78.0–100.0)
MONOS PCT: 13 %
Monocytes Absolute: 0.9 10*3/uL (ref 0.1–1.0)
NEUTROS PCT: 55 %
Neutro Abs: 3.8 10*3/uL (ref 1.7–7.7)
Platelets: 262 10*3/uL (ref 150–400)
RBC: 3.13 MIL/uL — AB (ref 4.22–5.81)
RDW: 14.9 % (ref 11.5–15.5)
WBC: 7 10*3/uL (ref 4.0–10.5)

## 2016-01-05 LAB — RENAL FUNCTION PANEL
ANION GAP: 5 (ref 5–15)
BUN: 55 mg/dL — ABNORMAL HIGH (ref 6–20)
CALCIUM: 7.1 mg/dL — AB (ref 8.9–10.3)
CO2: 16 mmol/L — ABNORMAL LOW (ref 22–32)
Chloride: 118 mmol/L — ABNORMAL HIGH (ref 101–111)
Creatinine, Ser: 7.96 mg/dL — ABNORMAL HIGH (ref 0.61–1.24)
GFR calc Af Amer: 10 mL/min — ABNORMAL LOW (ref >60)
GFR, EST NON AFRICAN AMERICAN: 8 mL/min — AB (ref >60)
GLUCOSE: 104 mg/dL — AB (ref 65–99)
PHOSPHORUS: 3.9 mg/dL (ref 2.5–4.6)
POTASSIUM: 4.1 mmol/L (ref 3.5–5.1)
SODIUM: 139 mmol/L (ref 135–145)

## 2016-01-05 LAB — BASIC METABOLIC PANEL
ANION GAP: 4 — AB (ref 5–15)
BUN: 57 mg/dL — ABNORMAL HIGH (ref 6–20)
CALCIUM: 7.2 mg/dL — AB (ref 8.9–10.3)
CO2: 16 mmol/L — AB (ref 22–32)
Chloride: 119 mmol/L — ABNORMAL HIGH (ref 101–111)
Creatinine, Ser: 8.09 mg/dL — ABNORMAL HIGH (ref 0.61–1.24)
GFR calc non Af Amer: 8 mL/min — ABNORMAL LOW (ref >60)
GFR, EST AFRICAN AMERICAN: 9 mL/min — AB (ref >60)
Glucose, Bld: 91 mg/dL (ref 65–99)
Potassium: 4.6 mmol/L (ref 3.5–5.1)
Sodium: 139 mmol/L (ref 135–145)

## 2016-01-05 LAB — CRYPTOCOCCAL ANTIGEN: CRYPTO AG: NEGATIVE

## 2016-01-05 LAB — RPR: RPR Ser Ql: REACTIVE — AB

## 2016-01-05 LAB — MPO/PR-3 (ANCA) ANTIBODIES
ANCA Proteinase 3: <3.5 U/mL (ref 0.0–3.5)
Myeloperoxidase Abs: <9 U/mL (ref 0.0–9.0)

## 2016-01-05 LAB — T-HELPER CELLS (CD4) COUNT (NOT AT ARMC)
CD4 T CELL ABS: 80 /uL — AB (ref 400–2700)
CD4 T CELL HELPER: 4 % — AB (ref 33–55)

## 2016-01-05 LAB — C-REACTIVE PROTEIN: CRP: 2 mg/dL — ABNORMAL HIGH (ref <1.0)

## 2016-01-05 LAB — ANCA TITERS

## 2016-01-05 LAB — GLOMERULAR BASEMENT MEMBRANE ANTIBODIES: GBM Ab: 7 units (ref 0–20)

## 2016-01-05 LAB — HEPATITIS A ANTIBODY, TOTAL: HEP A TOTAL AB: POSITIVE — AB

## 2016-01-05 LAB — RPR, QUANT+TP ABS (REFLEX)
Rapid Plasma Reagin, Quant: 1:128 {titer} — ABNORMAL HIGH
TREPONEMA PALLIDUM AB: POSITIVE — AB

## 2016-01-05 LAB — FANA STAINING PATTERNS

## 2016-01-05 LAB — ANTINUCLEAR ANTIBODIES, IFA: ANTINUCLEAR ANTIBODIES, IFA: POSITIVE — AB

## 2016-01-05 MED ORDER — ENSURE ENLIVE PO LIQD
237.0000 mL | ORAL | Status: DC
  Administered 2016-01-05 – 2016-01-11 (×6): 237 mL via ORAL

## 2016-01-05 MED ORDER — MIDAZOLAM HCL 2 MG/2ML IJ SOLN
INTRAMUSCULAR | Status: AC | PRN
  Administered 2016-01-05 (×2): 1 mg via INTRAVENOUS

## 2016-01-05 MED ORDER — LIDOCAINE HCL 1 % IJ SOLN
INTRAMUSCULAR | Status: AC
  Filled 2016-01-05: qty 20

## 2016-01-05 MED ORDER — MIDAZOLAM HCL 2 MG/2ML IJ SOLN
INTRAMUSCULAR | Status: AC
  Filled 2016-01-05: qty 4

## 2016-01-05 MED ORDER — ALBUTEROL SULFATE (2.5 MG/3ML) 0.083% IN NEBU
2.5000 mg | INHALATION_SOLUTION | Freq: Once | RESPIRATORY_TRACT | Status: AC
  Administered 2016-01-05: 2.5 mg via RESPIRATORY_TRACT
  Filled 2016-01-05: qty 3

## 2016-01-05 MED ORDER — FENTANYL CITRATE (PF) 100 MCG/2ML IJ SOLN
INTRAMUSCULAR | Status: AC | PRN
  Administered 2016-01-05 (×2): 50 ug via INTRAVENOUS

## 2016-01-05 MED ORDER — ADULT MULTIVITAMIN W/MINERALS CH
1.0000 | ORAL_TABLET | Freq: Every day | ORAL | Status: DC
  Administered 2016-01-05 – 2016-01-13 (×8): 1 via ORAL
  Filled 2016-01-05 (×8): qty 1

## 2016-01-05 MED ORDER — FENTANYL CITRATE (PF) 100 MCG/2ML IJ SOLN
INTRAMUSCULAR | Status: AC
  Filled 2016-01-05: qty 4

## 2016-01-05 NOTE — Progress Notes (Signed)
Initial Nutrition Assessment   INTERVENTION:  Provide Ensure Enlive po once daily, each supplement provides 350 kcal and 20 grams of protein Encouraged PO intake and weight maintenance   NUTRITION DIAGNOSIS:   Increased nutrient needs related to chronic illness as evidenced by estimated needs.   GOAL:   Patient will meet greater than or equal to 90% of their needs   MONITOR:   PO intake, Supplement acceptance, Labs, Weight trends, Skin  REASON FOR ASSESSMENT:   Malnutrition Screening Tool    ASSESSMENT:   30 year old man with about a 28 year history of HIV but treatment nave came to the emergency department yesterday because of several weeks of progressive dyspnea with exertion, food aversion, and unintentional weight loss. He reports that the thing that bothering him the most is nausea and lower abdominal pain. He's been having diarrhea with about 5-10 watery bowel movements per day.  Pt states that his appetite has been good and he has been eating normally, but he has noticed that he looks thinner when he looks in the mirror. Pt is unsure how much weight he has lost. He has some mild muscle wasting of clavicles, hands, and patellar per nutrition-focused physical exam, but otherwise appears well-nourished.  RD encouraged pt to monitor his weight; encouraged weight maintenance. If patient continues to lose weight, recommending increasing calorie intake by adding snacks and nutritional supplements/protein shakes. Provided "General Healthful Nutrition Therapy" and "Suggestions for Increasing Calories and Protein" handouts from the Academy of Nutrition and Dietetics. RD name and contact information provided.   Labs: elevated chloride, high creatinine and BUN, low calcium, low GFR, low hemoglobin  Diet Order:  Diet renal with fluid restriction Fluid restriction: 1200 mL Fluid; Room service appropriate? Yes; Fluid consistency: Thin  Skin:  Reviewed, no issues  Last BM:   8/6  Height:   Ht Readings from Last 1 Encounters:  01/05/16 6' (1.829 m)    Weight:   Wt Readings from Last 1 Encounters:  01/05/16 169 lb 3.2 oz (76.7 kg)    Ideal Body Weight:  80.9 kg  BMI:  Body mass index is 22.95 kg/m.  Estimated Nutritional Needs:   Kcal:  8325-4982  Protein:  90-105 grams  Fluid:  2.7 L/day  EDUCATION NEEDS:   No education needs identified at this time  [place], LDN Inpatient Clinical Dietitian Pager: [phone] After Hours Pager: [phone]

## 2016-01-05 NOTE — Progress Notes (Addendum)
Subjective: Patient continues to complain of cough and diarrhea. Additionally, the patient continues to have bright red blood from his mouth. His blood appears to be from the gingival mucosa secondary to gingivitis and periodontitis. He states that he does not feel good this morning. But is afebrile this morning.  Patient was febrile to 102.5 overnight but is afebrile this morning.    Objective:  Vital signs in last 24 hours: Vitals:   01/04/16 1500 01/04/16 1950 01/04/16 2324 01/05/16 0634  BP: (!) 147/83 (!) 142/87 (!) 141/84 133/78  Pulse: 95 90 90 82  Resp: 18 18 18 20   Temp: 100.3 F (37.9 C) 99.8 F (37.7 C) (!) 102.5 F (39.2 C) 99.6 F (37.6 C)  TempSrc: Oral Oral Oral Oral  SpO2: 100% 99% 100% 97%  Weight:    169 lb 3.2 oz (76.7 kg)   Physical Exam  Constitutional: He is oriented to person, place, and time.  Appears fatigued and uncomfortable this morning  HENT:  Head: Normocephalic and atraumatic.  Extremely poor dentition. Patient with severe gingivitis and most likely periodontitis as evidenced by several mobile teeth. Patient currently bleeding from his gums. I was unable to appreciate any source of bleeding in the oropharynx or the posterior pharyngeal wall.  Cardiovascular: Regular rhythm.  Exam reveals no gallop and no friction rub.   No murmur heard. Patient was tachycardic on examination  Respiratory: Effort normal. No respiratory distress. He has wheezes.  Patient with bilateral wheezes in the upper and lower lung fields that seem worse to me this morning than yesterday  GI: Soft. Bowel sounds are normal. He exhibits no distension. There is no tenderness.  No abdominal or renal bruits auscultated  Musculoskeletal: He exhibits no edema or deformity.  Neurological: He is alert and oriented to person, place, and time.  Skin: He is diaphoretic.  Diaphoretic  Psychiatric:  Seems guarded and somewhat withdrawn     Assessment/Plan: [name] is a  30 year old male with a past medical history of HIV not on ART therapywho presents with a 4-5 week history of increased fatigue, cough and diarrhea found to have renal failure.  Assessment & Plan by Problem: 1. Renal Failure -- The differential diagnosis for the patient's renal failure includes HIV-associated nephropathy, focal segmental glomerulosclerosis or other glomerular process. -- FeNa greater than 2% indicating intrinsic renal pathology -- Hep B and Hep C antibodies- both negative -- Lipid panel- hypertriglyceridemia at 159, elevated triglycerides are consistent with nephrotic range proteinuria -- ESR elevated at 140 -- Oral sodium bicarbonate per renal recommendation, will continue 1300 mg 3 times a day -- Daily renal function panel- creatinine 8.09, CO2 16 -- Potassium normal, corrected calcium normal -- Nephrology consulted will have renal biopsy today -- CK decreased at 38 -- C3 and C4 within normal range, total complement level greater than 60 -- Cryoglobulin -- GBM anti-bodies   2. Diarrhea  -- Patient with 4-5 week history of nonbloody diarrhea. Currently the differential diagnosis in an HIV-positive patient not on ART is broad and includes viral, bacterial and fungal etiologies. Additionally, opportunistic infections such as Giardia, MAC and CMV. -- CD4 T-helper cell count and HIV RNA viral load will help determine if the patient is immunosuppressed and help guide the differential for this patient's diarrhea -- Acid-fast smear of stool -- C. Difficile negative -- Stool ova and parasites -- Infectious diseases consult, we'll follow the recommendations per their note -- Currently, we will not start broad-spectrum antibiotic therapy or antiretroviral  therapy until results of CD4 count and HIV viral load  3. Chronic Cough -- HIV patient not on ARTcomplains of cough of one year duration with greenish sputum production. He also states he has a diagnosis of bronchitis in  the past and attributes this cough to his bronchitis. Given his HIV status differential diagnosis for his chronic cough is extremely broad but does include atypical organism such as PCP pneumonia. Results of the HIV viral load and CD4 cell count will help differentiating potential infectious etiologies. -- Chest x-ray normal -- No leukocytosis most recent white cell count of 7.0 -- Patient febrile to 1-2.5 overnight, currently afebrile this morning -- Quantiferon tb Gold Assay -- Given albuterol nebulizer for increased wheezing noted on pulmonary examination this morning   3. Non-anion gap metabolic acidosis -- CO2 of 16, sodium 139, chloride 119 -- Differential diagnosis includes renal tubular acidosis and chronic diarrhea -- Renal labs as above -- Oral sodium bicarbonate 1200 mg 3 times a day -- Nephrology consulted will have biopsy today  4. HIV -- Patient states he was diagnosed with HIV 2-3 years ago and was never taking any antiretroviral therapy. -- He has not followed up with a doctor for this in the past. -- CD4 count and HIV viral load -- Infectious diseases consult will follow recommendations regarding treatment when they are made -- Acid-fast smear of blood --  Cryptococcal antigen from blood -- CT scan without contrast to evaluate for increased intracranial pressure   5. Fever -- Patient febrile to 102.5 overnight, currently afebrile -- Normal chest x-ray -- Urinalysis not suspicious for infection -- Blood cultures no growth to date   6. Mucosal Bleeding -- Patient with bright red blood per mouth. This blood appears to be originating from the gingival mucosa in the setting of severe gingivitis. On tightness. -- There are no other obvious areas of bleeding in the patient denies hematemesis or nosebleeds -- Discontinued prophylactic heparin -- INR normal -- Hemoglobin 9.2 -- Platelet function normal -- We'll continue to monitor  Dispo: Anticipated discharge in  approximately 2-3 day(s).   LOS: 2 days   Ophelia Shoulder, MD 01/05/2016, 8:18 AM Pager: [phone]

## 2016-01-05 NOTE — Sedation Documentation (Signed)
Patient is resting comfortably. 

## 2016-01-05 NOTE — Progress Notes (Signed)
      INFECTIOUS DISEASE ATTENDING ADDENDUM:   Date: 01/05/2016  Patient name: Jonathon Moore  Medical record number: [id number]  Date of birth: [birth year]    See  My prior discussion re the patients risk for having cryptococcal meningitis  With high RPR of 1:128 he also should  Have an LP to look for Neurosyphilis in addition to looking for cryptococcal meningitis  With LP also for  VDRL  Fluorescent treponemal antibody test  Jonathon Moore 01/05/2016, 10:03 PM

## 2016-01-05 NOTE — Progress Notes (Signed)
[facility] Rounding Note Subjective: Still having cough and diarrhea. Bright red blood from mouth still intermittently but not as bad today. Doesn't feel well. Afebrile right now.  Had fever to 102.5 last night.  Feels cold.  Objective: BP 133/78 (BP Location: Left Arm)   Pulse 82   Temp 99.6 F (37.6 C) (Oral)   Resp 20   Wt 169 lb 3.2 oz (76.7 kg) Comment: scale c  SpO2 97%   BMI 22.95 kg/m    Intake/Output Summary (Last 24 hours) at 01/05/16 0831 Last data filed at 01/05/16 0600  Gross per 24 hour  Intake             1080 ml  Output               75 ml  Net             1005 ml    Weight change: 2 lb 6.4 oz (1.089 kg)  EXAM: General: resting in bed, fatigued. HEENT: poor dentition, EOMI, no scleral icterus Cardiac: RRR, no rubs, murmurs or gallops Pulm: bilateral wheezes, moving normal volumes of air Abd: soft, nontender, nondistended, BS present Ext: warm and well perfused, no pedal edema Neuro: alert and oriented X3, cranial nerves II-XII grossly intact Psych: reserved, pleasant    Labs: Basic Metabolic Panel:  Recent Labs Lab 01/03/16 1549 01/04/16 0505 01/04/16 1038 01/05/16 0328  NA 141 140 140 139  K 4.6 4.4 4.4 4.1  CL 122* 120* 118* 118*  CO2 13* 14* 16* 16*  GLUCOSE 87 102* 78 104*  BUN 55* 56* 52* 55*  CREATININE 7.92* 7.67* 7.68* 7.96*  CALCIUM 7.2* 6.9* 7.1* 7.1*  PHOS 4.5  --  3.6 3.9    Liver Function Tests:  Recent Labs Lab 01/03/16 1046  01/04/16 0505 01/04/16 1038 01/05/16 0328  AST 26  --  21  --   --   ALT 19  --  17  --   --   ALKPHOS 129*  --  112  --   --   BILITOT 0.4  --  0.4  --   --   PROT 5.7*  --  5.2*  --   --   ALBUMIN <1.0*  < > <1.0* <1.0* <1.0*  < > = values in this interval not displayed.  Recent Labs Lab 01/03/16 1046  LIPASE 25   CBC:  Recent Labs Lab 01/03/16 1046 01/04/16 1038 01/04/16 1803 01/05/16 0327  WBC 8.5 8.5 8.5 7.0  NEUTROABS  --  4.3  --  3.8  HGB 10.7* 10.2* 9.0* 9.2*  HCT 32.2*  30.5* 27.3* 27.8*  MCV 88.5 87.6 88.9 88.8  PLT 336 285 249 262    Cardiac Enzymes:  Recent Labs Lab 01/03/16 1549  CKTOTAL 38*    Studies/Results: Dg Chest 2 View  Result Date: 01/03/2016 CLINICAL DATA:  Shortness of breath and dyspnea on exertion. EXAM: CHEST  2 VIEW COMPARISON:  03/25/2015 FINDINGS: Normal heart size. There is no pleural effusion or edema. No airspace consolidation identified. The visualized osseous structures are unremarkable. IMPRESSION: No active cardiopulmonary abnormalities Electronically Signed   By: Kerby Moors M.D.   On: 01/03/2016 13:57   US Renal  Result Date: 01/03/2016 CLINICAL DATA:  Acute kidney injury.  HIV infection EXAM: RENAL / URINARY TRACT ULTRASOUND COMPLETE COMPARISON:  11/16/2013 FINDINGS: Right Kidney: Length: 12.5 cm. Diffuse increased parenchymal echogenicity. No mass or hydronephrosis visualized. Left Kidney: Length: 12.7 cm. Diffusely increased parenchymal echogenicity. No mass  or hydronephrosis visualized. Bladder: Appears normal for degree of bladder distention. IMPRESSION: 1. No obstructive uropathy. 2. Markedly echogenic kidneys are identified bilaterally. The appearance is nonspecific but may be seen with viral nephropathy. Electronically Signed   By: Kerby Moors M.D.   On: 01/03/2016 16:02   Background: 30 y.o. year-old AA man with PMH of bronchitis and HIV diagnosed in 2014 which has never been treated. 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 In the ED he was found to have a creatinine of 8.09, bicarb of 16, serum albumin <1, UA with >300 protein, 6-30 RBC's. Renal ultrasound ->12.5, 12.7 cm kidneys, echodense.  He was given a liter of NS in the ED and then admitted for further evaluation. Labs that have returned so far->neg Hep B,C, ESR 140. ANCA, ANA,  complements, cryoglobulins are all pending. HIV-1 RNA also pending. We are asked to see.  Assessment/Recommendations 1.  Renal Failure: with proteinuria and hematuria.  Normal renal function in Oct 2016 with creatinine of 1.08.  He presents with creatinine of 8 and proteinuria/microscopic hematuria (UPC could not be calculated d/t protein concentration ">3000") in the setting of untreated HIV.  Differential includes: HIV-associated nephropathy, focal segmental glomerulosclerosis or other glomerular process.  Multiple studies pending still.  No uremic symptoms at this time, but very close to needing dialysis and with current advanced renal failure, not sure how much improvement we could reasonably expect with treatment of his HIV.  Renal US with markedly echogenic kidneys identified bilaterally. The appearance is nonspecific but may be seen with viral nephropathy. - FeNagreater than 2% indicating intrinsic renal pathology - Hep B and Hep C antibodies- both negative - C3 and C4 within normal range, total complement level greater than 60 - Cryoglobulin - GBM anti-bodies - ANCA, ANA - CD4, VL - needs diagnostic renal biopsy  2.  Metabolic acidosis in setting of renal failure - Bicarb 16 today - on oral sodium bicarb started by primary service.  3.  Anemia: likely due to CKD - check iron studies - Aranesp or EPO may be needed if Hgb continues to stay below 10  4. HIV - Since 2014. Untreated (he says due to cost). Family is not aware of his diagnosis  5. Diarrhea, weight loss - stools for CDiff, O and P, AFB  ordered  6.  Fever  - blood cultures, quant gold, stool studies all ordered/ pending. ID following.   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   We await renal biopsy planned for today. I think prognosis for renal recovery is poor.  Will begin dialysis education. Jaelynn Pozo C

## 2016-01-05 NOTE — Procedures (Signed)
[place] RLP renal core bx 06Y x2 No complication No blood loss. See complete dictation in [facility].

## 2016-01-05 NOTE — Sedation Documentation (Signed)
bandaid to low back L side

## 2016-01-05 NOTE — Progress Notes (Signed)
Band aid to right back puncture site from biopsy today. bandaid clean, dry and intact.

## 2016-01-05 NOTE — Progress Notes (Signed)
Subjective: Intermittent headaches depressive symptoms  Antibiotics:  Anti-infectives    None      Medications: Scheduled Meds: . feeding supplement (ENSURE ENLIVE)  237 mL Oral Q24H  . multivitamin with minerals  1 tablet Oral Daily  . sodium bicarbonate  1,300 mg Oral TID   Continuous Infusions:  PRN Meds:.acetaminophen, phenol    Objective: Weight change: 2 lb 6.4 oz (1.089 kg)  Intake/Output Summary (Last 24 hours) at 01/05/16 1452 Last data filed at 01/05/16 0956  Gross per 24 hour  Intake             1080 ml  Output              275 ml  Net              805 ml   Blood pressure (!) 144/101, pulse 82, temperature 99.6 F (37.6 C), temperature source Oral, resp. rate 20, height 6' (1.829 m), weight 169 lb 3.2 oz (76.7 kg), SpO2 97 %. Temp:  [99.6 F (37.6 C)-102.5 F (39.2 C)] 99.6 F (37.6 C) (08/07 0634) Pulse Rate:  [82-95] 82 (08/07 0634) Resp:  [18-20] 20 (08/07 0634) BP: (133-147)/(78-101) 144/101 (08/07 1442) SpO2:  [97 %-100 %] 97 % (08/07 0634) Weight:  [169 lb 3.2 oz (76.7 kg)] 169 lb 3.2 oz (76.7 kg) (08/07 0160)  Physical Exam: General: Alert and awake, oriented x3, dysphoric, underweight HEENT: anicteric sclera, EOMI CVS regular rate, normal r,  no murmur rubs or gallops Chest: clear to auscultation bilaterally, no wheezing, rales or rhonchi Abdomen: soft nontender, nondistended, normal bowel sounds, Extremities: no  clubbing or edema noted bilaterally Skin: no rashes Neuro: nonfocal  CBC: CBC Latest Ref Rng & Units 01/05/2016 01/04/2016 01/04/2016  WBC 4.0 - 10.5 K/uL 7.0 8.5 8.5  Hemoglobin 13.0 - 17.0 g/dL 9.2(L) 9.0(L) 10.2(L)  Hematocrit 39.0 - 52.0 % 27.8(L) 27.3(L) 30.5(L)  Platelets 150 - 400 K/uL 262 249 285      BMET  Recent Labs  01/05/16 0328 01/05/16 0758  NA 139 139  K 4.1 4.6  CL 118* 119*  CO2 16* 16*  GLUCOSE 104* 91  BUN 55* 57*  CREATININE 7.96* 8.09*  CALCIUM 7.1* 7.2*     Liver  Panel   Recent Labs  01/03/16 1046  01/04/16 0505 01/04/16 1038 01/05/16 0328  PROT 5.7*  --  5.2*  --   --   ALBUMIN <1.0*  < > <1.0* <1.0* <1.0*  AST 26  --  21  --   --   ALT 19  --  17  --   --   ALKPHOS 129*  --  112  --   --   BILITOT 0.4  --  0.4  --   --   < > = values in this interval not displayed.     Sedimentation Rate  Recent Labs  01/03/16 1643  ESRSEDRATE 140*   C-Reactive Protein  Recent Labs  01/05/16 0327  CRP 2.0*    Micro Results: Recent Results (from the past 720 hour(s))  Rapid strep screen     Status: None   Collection Time: 01/03/16  1:31 PM  Result Value Ref Range Status   Streptococcus, Group A Screen (Direct) NEGATIVE NEGATIVE Final    Comment: (NOTE) A Rapid Antigen test may result negative if the antigen level in the sample is below the detection level of this test. The FDA has not cleared this test as  a stand-alone test therefore the rapid antigen negative result has reflexed to a Group A Strep culture.   Culture, group A strep     Status: None   Collection Time: 01/03/16  1:31 PM  Result Value Ref Range Status   Specimen Description THROAT  Final   Special Requests NONE Reflexed from [id number]  Final   Culture FEW STREPTOCOCCUS,BETA HEMOLYTIC NOT GROUP A  Final   Report Status 01/05/2016 FINAL  Final  Culture, blood (Routine X 2) w Reflex to ID Panel     Status: None (Preliminary result)   Collection Time: 01/04/16 10:38 AM  Result Value Ref Range Status   Specimen Description BLOOD LEFT ANTECUBITAL  Final   Special Requests BOTTLES DRAWN AEROBIC ONLY 5CC  Final   Culture NO GROWTH < 12 HOURS  Final   Report Status PENDING  Incomplete  Culture, blood (Routine X 2) w Reflex to ID Panel     Status: None (Preliminary result)   Collection Time: 01/04/16 10:38 AM  Result Value Ref Range Status   Specimen Description BLOOD RIGHT ANTECUBITAL  Final   Special Requests BOTTLES DRAWN AEROBIC AND ANAEROBIC 6CC EACH  Final   Culture NO  GROWTH < 12 HOURS  Final   Report Status PENDING  Incomplete  C difficile quick scan w PCR reflex     Status: None   Collection Time: 01/04/16  3:18 PM  Result Value Ref Range Status   C Diff antigen NEGATIVE NEGATIVE Final   C Diff toxin NEGATIVE NEGATIVE Final   C Diff interpretation No C. difficile detected.  Final    Studies/Results: Ct Head Wo Contrast  Result Date: 01/05/2016 CLINICAL DATA:  Headaches EXAM: CT HEAD WITHOUT CONTRAST TECHNIQUE: Contiguous axial images were obtained from the base of the skull through the vertex without intravenous contrast. COMPARISON:  11/11/2009 FINDINGS: Bony calvarium is intact. No significant ventricular dilatation is seen. No findings to suggest acute hemorrhage, acute infarction or space-occupying mass lesion are seen. IMPRESSION: No acute intracranial abnormality noted. Electronically Signed   By: Inez Catalina M.D.   On: 01/05/2016 13:26   US Renal  Result Date: 01/03/2016 CLINICAL DATA:  Acute kidney injury.  HIV infection EXAM: RENAL / URINARY TRACT ULTRASOUND COMPLETE COMPARISON:  11/16/2013 FINDINGS: Right Kidney: Length: 12.5 cm. Diffuse increased parenchymal echogenicity. No mass or hydronephrosis visualized. Left Kidney: Length: 12.7 cm. Diffusely increased parenchymal echogenicity. No mass or hydronephrosis visualized. Bladder: Appears normal for degree of bladder distention. IMPRESSION: 1. No obstructive uropathy. 2. Markedly echogenic kidneys are identified bilaterally. The appearance is nonspecific but may be seen with viral nephropathy. Electronically Signed   By: Kerby Moors M.D.   On: 01/03/2016 16:02      Assessment/Plan:  INTERVAL HISTORY:  Pt planned for IR guided biopsy   Principal Problem:   HIV (human immunodeficiency virus infection) ([place]) Active Problems:   Metabolic acidosis   Diarrhea   Shortness of breath   Normocytic anemia   Unintentional weight loss   Poor dentition   Renal failure    Omarr Hann is a 30 y.o. 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, fevers and now concern for HIVAN    #1 FUO: multiple possible causes and he may have multiple OI's  I think the most pressing issue for me with re to making a decision in terms of getting him on ARV ASAp is to rule in or rule out crypt coccal meningitis given his headaches  Therefore I would get a lumbar puncture with :  a documented "opening pressure, and sufficient CSF removed to drop the closing pressure to below 20 cm of water  CSF for stat cryptococcal antigen CSF for protein and glucose  CSF for cell count and differential  CSF for culture  And save further CSF  ( incase we need to send for Toxoplasma PCR (if he has ring enhancing lesions in Brain) EBV PCR ( similar picture), other studies  I agree M avium may be a cause of his fevers as well as other GI pathogens  Certainly his HIV itself could be cause of fevers  Hold off on empric rx for now  #2 HIVAN possibilit: really want to treat his HIV asap because of liklihood of this but problem will be that if he has crypto giving ARV now will be dangerous due to very high risk of IRIS in CNS  When we start ARV I will likely go with INSTI regimen of Tivicay + 3tC and AZT (or ABC if we have negative HLA test and no hep b coinfection  #3 Depression:  He did not see a psychiatrist he denied suicidal ideation or homicidal ideation.  He is going to need to be plugged into emergency ADAP. We can also likely get him ARV via "match" or pharma assistance though he will need multiple pills given his AKI  I spent greater than 40 minutes with the patient including greater than 50% of time in face to face counsel of the patient regarding his HIV AIDS fevers of unknown origin headaches diarrhea depression and in coordination of his care with primary team.   CT of head is not unreasonable    LOS: 2 days   Alcide Evener 01/05/2016, 2:52 PM

## 2016-01-05 NOTE — H&P (Signed)
Chief Complaint: Proteinuria Renal failure  Referring Physician(s): Dr. Jamal Maes  Supervising Physician: Arne Cleveland  Patient Status: Inpatient  History of Present Illness: Jonathon Moore is a 30 y.o. male who has a past medical history of bronchitis and HIV.  His HIV was diagnosed in 2014. He has never been treated.   He presented to [facility] ED on 01/03/2016 c/o SOB with exertion.  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   He also states he has had several weeks of loose stools 5 or 6 times a day.    He states he has lost weight but not sure how much.   In the ED was found to have a creatinine of 8.09.  He had a liter bolus of NS in the ED and was admitted for further evaluation.   He denies any decreased urine output. He denies change in color of urine  He denies LE edema.    He denies any current use of street drugs but in the past has "shot up meth".   He is a smoker.  He denies fever or chills.  He ate breakfast at 8:30 this morning.  We are asked to evaluate him for an Ultrasound guided random renal biopsy.   Past Medical History:  Diagnosis Date  . Bronchitis   . Hemorrhoids   . HIV (human immunodeficiency virus infection) ([employer])     History reviewed. No pertinent surgical history.  Allergies: Review of patient's allergies indicates no known allergies.  Medications: Prior to Admission medications   Medication Sig Start Date End Date Taking? Authorizing Provider  ibuprofen (ADVIL,MOTRIN) 200 MG tablet Take 400 mg by mouth 2 (two) times daily as needed for headache or moderate pain.   Yes Historical Provider, MD     History reviewed. No pertinent family history.  Social History   Social History  . Marital status: Single    Spouse name: N/A  . Number of children: N/A  . Years of education: N/A   Social History Main Topics  . Smoking status: Current Every Day Smoker    Packs/day: 0.20  .  Smokeless tobacco: Never Used  . Alcohol use Yes     Comment: weekly  . Drug use:     Types: Marijuana  . Sexual activity: Not Asked   Other Topics Concern  . None   Social History Narrative  . None     Review of Systems: A 12 point ROS discussed  Review of Systems  Constitutional: Positive for fatigue. Negative for activity change, appetite change, chills and fever.  HENT: Negative.   Respiratory: Positive for shortness of breath. Negative for cough.   Cardiovascular: Negative for chest pain.  Gastrointestinal: Positive for diarrhea. Negative for abdominal distention, abdominal pain, nausea and vomiting.  Genitourinary: Negative.   Musculoskeletal: Negative.   Neurological: Negative.   Hematological: Negative.   Psychiatric/Behavioral: Negative.     Vital Signs: BP 133/78 (BP Location: Left Arm)   Pulse 82   Temp 99.6 F (37.6 C) (Oral)   Resp 20   Wt 169 lb 3.2 oz (76.7 kg) Comment: scale c  SpO2 97%   BMI 22.95 kg/m   Physical Exam  Constitutional: He is oriented to person, place, and time. He appears well-developed and well-nourished.  HENT:  Head: Normocephalic and atraumatic.  Eyes: EOM are normal.  Neck: Normal range of motion.  Cardiovascular: Normal rate, regular rhythm and normal heart sounds.   Pulmonary/Chest:  Effort normal and breath sounds normal. No respiratory distress.  Abdominal: Soft. He exhibits no distension. There is no tenderness.  Musculoskeletal: Normal range of motion.  Neurological: He is alert and oriented to person, place, and time.  Skin: Skin is warm and dry.  Psychiatric: He has a normal mood and affect. His behavior is normal. Judgment and thought content normal.  Vitals reviewed.   Mallampati Score:  MD Evaluation Airway: WNL Heart: WNL Abdomen: WNL Chest/ Lungs: WNL ASA  Classification: 2 Mallampati/Airway Score: Two  Imaging: Dg Chest 2 View  Result Date: 01/03/2016 CLINICAL DATA:  Shortness of breath and dyspnea  on exertion. EXAM: CHEST  2 VIEW COMPARISON:  03/25/2015 FINDINGS: Normal heart size. There is no pleural effusion or edema. No airspace consolidation identified. The visualized osseous structures are unremarkable. IMPRESSION: No active cardiopulmonary abnormalities Electronically Signed   By: Kerby Moors M.D.   On: 01/03/2016 13:57   US Renal  Result Date: 01/03/2016 CLINICAL DATA:  Acute kidney injury.  HIV infection EXAM: RENAL / URINARY TRACT ULTRASOUND COMPLETE COMPARISON:  11/16/2013 FINDINGS: Right Kidney: Length: 12.5 cm. Diffuse increased parenchymal echogenicity. No mass or hydronephrosis visualized. Left Kidney: Length: 12.7 cm. Diffusely increased parenchymal echogenicity. No mass or hydronephrosis visualized. Bladder: Appears normal for degree of bladder distention. IMPRESSION: 1. No obstructive uropathy. 2. Markedly echogenic kidneys are identified bilaterally. The appearance is nonspecific but may be seen with viral nephropathy. Electronically Signed   By: Kerby Moors M.D.   On: 01/03/2016 16:02    Labs:  CBC:  Recent Labs  01/03/16 1046 01/04/16 1038 01/04/16 1803 01/05/16 0327  WBC 8.5 8.5 8.5 7.0  HGB 10.7* 10.2* 9.0* 9.2*  HCT 32.2* 30.5* 27.3* 27.8*  PLT 336 285 249 262    COAGS:  Recent Labs  01/04/16 1405  INR 1.11  APTT 48*    BMP:  Recent Labs  01/04/16 0505 01/04/16 1038 01/05/16 0328 01/05/16 0758  NA 140 140 139 139  K 4.4 4.4 4.1 4.6  CL 120* 118* 118* 119*  CO2 14* 16* 16* 16*  GLUCOSE 102* 78 104* 91  BUN 56* 52* 55* 57*  CALCIUM 6.9* 7.1* 7.1* 7.2*  CREATININE 7.67* 7.68* 7.96* 8.09*  GFRNONAA 9* 9* 8* 8*  GFRAA 10* 10* 10* 9*    LIVER FUNCTION TESTS:  Recent Labs  03/25/15 1119 01/03/16 1046 01/03/16 1549 01/04/16 0505 01/04/16 1038 01/05/16 0328  BILITOT 1.3* 0.4  --  0.4  --   --   AST 30 26  --  21  --   --   ALT 22 19  --  17  --   --   ALKPHOS 81 129*  --  112  --   --   PROT 8.1 5.7*  --  5.2*  --   --     ALBUMIN 3.0* <1.0* <1.0* <1.0* <1.0* <1.0*    TUMOR MARKERS: No results for input(s): AFPTM, CEA, CA199, CHROMGRNA in the last 8760 hours.  Assessment and Plan:  Acute renal failure Proteinuria  Will proceed with Ultrasound guided random renal biopsy by Dr. Vernard Gambles.  Risks and Benefits discussed with the patient including, but not limited to bleeding, infection, damage to adjacent structures or low yield requiring additional tests.  All of the patient's questions were answered, patient is agreeable to proceed. Consent signed and in chart.  Thank you for this interesting consult.  I greatly enjoyed meeting Jonathon Moore and look forward to participating in their care.  A copy of this report was sent to the requesting provider on this date.  Electronically Signed: Murrell Redden PA-C 01/05/2016, 10:00 AM   I spent a total of 40 Minutes in face to face in clinical consultation, greater than 50% of which was counseling/coordinating care for random renal biopsy

## 2016-01-06 ENCOUNTER — Encounter (HOSPITAL_COMMUNITY): Payer: Self-pay

## 2016-01-06 ENCOUNTER — Inpatient Hospital Stay (HOSPITAL_COMMUNITY): Payer: Medicaid Other

## 2016-01-06 DIAGNOSIS — Z8619 Personal history of other infectious and parasitic diseases: Secondary | ICD-10-CM

## 2016-01-06 DIAGNOSIS — R51 Headache: Secondary | ICD-10-CM

## 2016-01-06 DIAGNOSIS — R519 Headache, unspecified: Secondary | ICD-10-CM

## 2016-01-06 DIAGNOSIS — Z9889 Other specified postprocedural states: Secondary | ICD-10-CM

## 2016-01-06 LAB — GLUCOSE, CSF: GLUCOSE CSF: 43 mg/dL (ref 40–70)

## 2016-01-06 LAB — ACID FAST SMEAR (AFB)

## 2016-01-06 LAB — CSF CELL COUNT WITH DIFFERENTIAL
EOS CSF: 0 % (ref 0–1)
LYMPHS CSF: 92 % — AB (ref 40–80)
MONOCYTE-MACROPHAGE-SPINAL FLUID: 7 % — AB (ref 15–45)
RBC COUNT CSF: 46 /mm3 — AB
SEGMENTED NEUTROPHILS-CSF: 1 % (ref 0–6)
TUBE #: 1
WBC, CSF: 44 /mm3 (ref 0–5)

## 2016-01-06 LAB — IRON AND TIBC
Iron: 46 ug/dL (ref 45–182)
SATURATION RATIOS: 43 % — AB (ref 17.9–39.5)
TIBC: 108 ug/dL — ABNORMAL LOW (ref 250–450)
UIBC: 62 ug/dL

## 2016-01-06 LAB — CRYPTOCOCCAL ANTIGEN, CSF: CRYPTO AG: NEGATIVE

## 2016-01-06 LAB — PROTEIN, CSF: Total  Protein, CSF: 34 mg/dL (ref 15–45)

## 2016-01-06 LAB — FERRITIN: Ferritin: 839 ng/mL — ABNORMAL HIGH (ref 24–336)

## 2016-01-06 MED ORDER — LIDOCAINE HCL 1 % IJ SOLN
INTRAMUSCULAR | Status: AC
  Filled 2016-01-06: qty 10

## 2016-01-06 MED ORDER — LOPERAMIDE HCL 2 MG PO CAPS
2.0000 mg | ORAL_CAPSULE | ORAL | Status: DC | PRN
  Administered 2016-01-08: 2 mg via ORAL
  Filled 2016-01-06: qty 1

## 2016-01-06 MED ORDER — ACETAMINOPHEN 325 MG PO TABS
650.0000 mg | ORAL_TABLET | Freq: Four times a day (QID) | ORAL | Status: DC | PRN
  Administered 2016-01-07 – 2016-01-13 (×12): 650 mg via ORAL
  Filled 2016-01-06 (×12): qty 2

## 2016-01-06 NOTE — Progress Notes (Signed)
Subjective: Patient had renal biopsy yesterday. Only complains of mild soreness over the biopsy site. He denies additional bright red blood per mouth. He says that he was doing okay this morning. He continues to have multiple episodes of nonbloody diarrhea daily.  Objective:  Vital signs in last 24 hours: Vitals:   01/05/16 1613 01/05/16 1747 01/05/16 2156 01/05/16 2355  BP: 129/76  132/72 137/76  Pulse: 98  96 97  Resp: _0 Temp: (!) 100.8 F (38.2 C) (!) 100.7 F (38.2 C) (!) 100.8 F (38.2 C) 100.2 F (37.9 C)  TempSrc: Oral Oral Oral Oral  SpO2: 99%  98% 100%  Weight:      Height:       Physical Exam  Constitutional: He is oriented to person, place, and time.  In no acute distress, was sleeping this morning on rounds   HENT:  Head: Normocephalic and atraumatic.  Cardiovascular: Regular rhythm.  Exam reveals no gallop and no friction rub.   No murmur heard. Tachycardic  Respiratory: Effort normal. No respiratory distress. He has wheezes. He has no rales. He exhibits no tenderness.  Patient continues to have bilateral wheezes but is moving good air  GI: Soft. Bowel sounds are normal. He exhibits no distension. There is no tenderness.  Neurological: He is alert and oriented to person, place, and time.  Psychiatric:  Patient is depressed mood and affect are guarded     Assessment/Plan: Jonathon Moore is a 30 year old male with AIDS (CD4 count 80) not on ART therapywho presents with a 4-5 week history of increased fatigue, cough and diarrhea found to have renal failure.  Assessment & Plan by Problem: 1. Renal Failure -- The differential diagnosis for the patient's renal failure includes HIV-associated nephropathy, focal segmental glomerulosclerosis or other glomerular process. -- FeNagreater than 2% indicating intrinsic renal pathology -- Hep B and Hep C antibodies- both negative -- Lipid panel- hypertriglyceridemia at 159, elevated triglycerides are consistent  with nephrotic range proteinuria -- ESR elevated at 140 -- Oral sodium bicarbonate per renal recommendation, will continue 1300 mg 3 times a day -- Daily renal function panel- creatinine 8.09, CO2 16 -- Potassium normal, corrected calcium normal -- Renal biopsy yesterday we will follow up results of pathology  2. AIDS -- Patient states he was diagnosed with HIV 2-3 years ago and was never taking any antiretroviral therapy. -- He has not followed up with a doctor for this in the past. -- CD4 count 80 -- Infectious diseases consult will follow recommendations regarding treatment when they are made -- Acid-fast smear of blood --  Cryptococcal antigen (blood) negative -- CT scan no evidence of increased ICP -- RPR (non-treponemal) positive, will f/u with treponemal test (FTA-ABS) -- lumbar puncture this afternoon with the following labs VRDL, opening pressure, CSF for stat cryptococcal antigen, CSF for protein and glucose, CSF for cell count differential, CSF for culture, and save further CSF    3. Diarrhea  -- Patient with 4-5 week history of nonbloody diarrhea. Currently the differential diagnosis in a patient with AIDS (CD4 count 80) not on ART is broad and includes viral, bacterial and fungal etiologies. Additionally, opportunistic infections such as Giardia, MAC and CMV. -- Acid-fast smear of stool -- C. Difficile negative -- Stool ova and parasites -- Infectious diseases consult, we'll follow the recommendations per their note -- Loperamide 2 mg as needed for diarrhea  4. Chronic Cough -- AIDS (CD4 count 80) not on ARTcomplains of cough of  one year duration with greenish sputum production. Given his HIV status differential diagnosis for his chronic cough is extremely broad but does include atypical organism such as PCP pneumonia. Results of the HIV viral load and CD4 cell count will help differentiating potential infectious etiologies. -- Chest x-ray normal -- No leukocytosis  most recent white cell count of 7.0 -- Patient febrile to 100.8 overnight, currently afebrile this morning -- Quantiferon tb Gold Assay   5. Non-anion gap metabolic acidosis -- CO2 of 16, sodium 139, chloride 119 -- Differential diagnosis includes renal tubular acidosis and chronic diarrhea -- Renal labs as above -- Oral sodium bicarbonate 1200 mg 3 times a day -- Nephrology consulted will have biopsy today    6. Fever -- Patient febrile to 100.8 overnight, currently afebrile -- Normal chest x-ray -- Urinalysis not suspicious for infection -- Blood cultures no growth to date   7. Mucosal Bleeding -- No additional episodes -- Discontinued prophylactic heparin -- INR normal -- Hemoglobin 9.2 -- Platelet function normal -- We'll continue to monitor  Dispo: Anticipated discharge in approximately 2-3 day(s).   Ophelia Shoulder, MD 01/06/2016, 10:10 AM Pager: [phone]

## 2016-01-06 NOTE — Progress Notes (Signed)
[facility] Rounding Note Subjective: Feels okay.  He is withdrawn. Having some soreness at biopsy site.  Objective: BP 137/76 (BP Location: Left Arm)   Pulse 97   Temp 100.2 F (37.9 C) (Oral)   Resp 16   Ht 6' (1.829 m) Comment: from previous admission  Wt 169 lb 3.2 oz (76.7 kg) Comment: scale c  SpO2 100%   BMI 22.95 kg/m    Intake/Output Summary (Last 24 hours) at 01/06/16 0825 Last data filed at 01/05/16 0956  Gross per 24 hour  Intake              360 ml  Output              200 ml  Net              160 ml    Weight change:   EXAM: General: resting in bed, withdrawn, eating breakfast. HEENT: poor dentition, EOMI, no scleral icterus Cardiac: RRR, no rubs, murmurs or gallops Pulm: bilateral wheezes, moving normal volumes of air Abd: soft, nontender, nondistended Back: small Band Aid covering biopsy site Ext: warm and well perfused, no pedal edema, SCDs Neuro: alert and oriented X3, cranial nerves II-XII grossly intact Psych: reserved, withdrawn, pleasant    Labs: Basic Metabolic Panel:  Recent Labs Lab 01/03/16 1549  01/04/16 1038 01/05/16 0328 01/05/16 0758  NA 141  < > 140 139 139  K 4.6  < > 4.4 4.1 4.6  CL 122*  < > 118* 118* 119*  CO2 13*  < > 16* 16* 16*  GLUCOSE 87  < > 78 104* 91  BUN 55*  < > 52* 55* 57*  CREATININE 7.92*  < > 7.68* 7.96* 8.09*  CALCIUM 7.2*  < > 7.1* 7.1* 7.2*  PHOS 4.5  --  3.6 3.9  --   < > = values in this interval not displayed.  Liver Function Tests:  Recent Labs Lab 01/03/16 1046  01/04/16 0505 01/04/16 1038 01/05/16 0328  AST 26  --  21  --   --   ALT 19  --  17  --   --   ALKPHOS 129*  --  112  --   --   BILITOT 0.4  --  0.4  --   --   PROT 5.7*  --  5.2*  --   --   ALBUMIN <1.0*  < > <1.0* <1.0* <1.0*  < > = values in this interval not displayed.  Recent Labs Lab 01/03/16 1046  LIPASE 25   CBC:  Recent Labs Lab 01/03/16 1046 01/04/16 1038 01/04/16 1803 01/05/16 0327  WBC 8.5 8.5 8.5 7.0   NEUTROABS  --  4.3  --  3.8  HGB 10.7* 10.2* 9.0* 9.2*  HCT 32.2* 30.5* 27.3* 27.8*  MCV 88.5 87.6 88.9 88.8  PLT 336 285 249 262    Cardiac Enzymes:  Recent Labs Lab 01/03/16 1549  CKTOTAL 38*    Studies/Results: Ct Head Wo Contrast  Result Date: 01/05/2016 CLINICAL DATA:  Headaches EXAM: CT HEAD WITHOUT CONTRAST TECHNIQUE: Contiguous axial images were obtained from the base of the skull through the vertex without intravenous contrast. COMPARISON:  11/11/2009 FINDINGS: Bony calvarium is intact. No significant ventricular dilatation is seen. No findings to suggest acute hemorrhage, acute infarction or space-occupying mass lesion are seen. IMPRESSION: No acute intracranial abnormality noted. Electronically Signed   By: Inez Catalina M.D.   On: 01/05/2016 13:26   US Biopsy  Result Date:  01/05/2016 CLINICAL DATA:  Chronic renal insufficiency EXAM: ULTRASOUND GUIDED RENAL CORE BIOPSY COMPARISON:  CT 11/16/2013 TECHNIQUE: The procedure, risks (including but not limited to bleeding, infection, organ damage ), benefits, and alternatives were explained to the patient. Questions regarding the procedure were encouraged and answered. The patient understands and consents to the procedure. Survey ultrasound was performed and an appropriate skin entry site was localized. Site was marked, prepped with Betadine, draped in usual sterile fashion, infiltrated locally with 1% lidocaine. Intravenous Fentanyl and Versed were administered as conscious sedation during continuous cardiorespiratory monitoring by the radiology RN with a total moderate sedation time of 10 minutes. Under real time ultrasound guidance, a 15 gauge trocar needle was advanced to the margin of the lower pole of the right kidney for coaxial core biopsy using a 16 gauge needle for 2 passes. The core samples were submitted to pathology. The patient tolerated procedure well. COMPLICATIONS: COMPLICATIONS None. IMPRESSION: 1. Technically successful  ultrasound-guided core renal biopsy , lower pole right kidney Electronically Signed   By: Lucrezia Europe M.D.   On: 01/05/2016 16:42   Background: 30 y.o. year-old [place] man with PMH of bronchitis and HIV diagnosed in 2014 which has never been treated. 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 In the ED he was found to have a creatinine of 8.09, bicarb of 16, serum albumin <1, UA with >300 protein, 6-30 RBC's. Renal ultrasound ->12.5, 12.7 cm kidneys, echodense.  He was given a liter of NS in the ED and then admitted for further evaluation. Labs that have returned so far->neg Hep B,C, ESR 140. ANCA, ANA, complements, cryoglobulins are all pending. HIV-1 RNA also pending. We are asked to see.  Assessment/Recommendations 1.  Renal Failure: with proteinuria and hematuria.   Normal renal function in Oct 2016 with creatinine of 1.08.  He presents with creatinine of 8 and proteinuria/microscopic hematuria (UPC could not be calculated d/t protein concentration ">3000") in the setting of untreated HIV.   Differential includes: HIV-associated nephropathy, focal segmental glomerulosclerosis or other glomerular process.   No uremic symptoms at this time, but close to needing dialysis and with current advanced renal failure, not sure how much improvement we could reasonably expect with treatment of his HIV.   Renal US with markedly echogenic kidneys identified bilaterally.  The appearance is nonspecific but may be seen with viral nephropathy. - FeNagreater than 2% indicating intrinsic renal pathology - Hep B and Hep C antibodies- both negative - C3 and C4 within normal range, total complement level greater than 60 - Cryoglobulin pending - GBM anti-bodies negative - ANCA negative - ANA positive - CD4 80, VL processing - renal biopsy done  yesterday, awaiting results - educational HD videos ordered yesterday - will order vein mapping bilateral upper extremities to begin access planning.  Discussed this with patient. - no new labs this morning  2.  Metabolic acidosis in setting of renal failure - Bicarb 16 yesterday - on oral sodium bicarb started by primary service.  3.  Anemia: likely due to CKD - Ferritin 839, Iron 46, Saturation ratio 43% - Aranesp or EPO may be needed if Hgb continues to stay below 10  4. HIV - Since 2014. Untreated. Family is not aware of his diagnosis. - CD 4 80 - VL processing  5. Diarrhea, weight loss - stools for CDiff, O and P, AFB  ordered  6.  Fever  - blood cultures, quant gold, stool studies all ordered/ pending. ID following.   Jule Ser  8:25 AM  Renal Attending: I agree with the note articulated above by Dr Juleen China.  I think renal recovery is unlikely. Taiana Temkin C

## 2016-01-06 NOTE — Progress Notes (Signed)
Report given to on coming nurse, lab called at 1900 to state critical value of csf wbc 44, on coming nurse stated they will follow up with physican Arsenio Loader 7:22 PM 01-06-2016

## 2016-01-06 NOTE — Progress Notes (Signed)
Subjective: Intermittent headaches   Antibiotics:  Anti-infectives    None      Medications: Scheduled Meds: . feeding supplement (ENSURE ENLIVE)  237 mL Oral Q24H  . lidocaine      . multivitamin with minerals  1 tablet Oral Daily  . sodium bicarbonate  1,300 mg Oral TID   Continuous Infusions:  PRN Meds:.acetaminophen, loperamide, phenol    Objective: Weight change:   Intake/Output Summary (Last 24 hours) at 01/06/16 1519 Last data filed at 01/06/16 1330  Gross per 24 hour  Intake              600 ml  Output              300 ml  Net              300 ml   Blood pressure (!) 149/89, pulse 90, temperature 100.2 F (37.9 C), temperature source Oral, resp. rate 18, height 6' (1.829 m), weight 169 lb 3.2 oz (76.7 kg), SpO2 99 %. Temp:  [100.2 F (37.9 C)-100.8 F (38.2 C)] 100.2 F (37.9 C) (08/07 2355) Pulse Rate:  [90-100] 90 (08/08 1300) Resp:  [16-18] 18 (08/08 1300) BP: (129-149)/(72-96) 149/89 (08/08 1300) SpO2:  [94 %-100 %] 99 % (08/08 1300)  Physical Exam: General: Alert and awake, oriented x3,  underweight HEENT: anicteric sclera, EOMI CVS regular rate, normal r,  no murmur rubs or gallops Chest: clear to auscultation bilaterally, no wheezing, rales or rhonchi Abdomen: soft nontender, nondistended, normal bowel sounds, Extremities: no  clubbing or edema noted bilaterally Skin: no rashes Neuro: nonfocal  CBC: CBC Latest Ref Rng & Units 01/05/2016 01/04/2016 01/04/2016  WBC 4.0 - 10.5 K/uL 7.0 8.5 8.5  Hemoglobin 13.0 - 17.0 g/dL 9.2(L) 9.0(L) 10.2(L)  Hematocrit 39.0 - 52.0 % 27.8(L) 27.3(L) 30.5(L)  Platelets 150 - 400 K/uL 262 249 285      BMET  Recent Labs  01/05/16 0328 01/05/16 0758  NA 139 139  K 4.1 4.6  CL 118* 119*  CO2 16* 16*  GLUCOSE 104* 91  BUN 55* 57*  CREATININE 7.96* 8.09*  CALCIUM 7.1* 7.2*     Liver Panel   Recent Labs  01/04/16 0505 01/04/16 1038 01/05/16 0328  PROT 5.2*  --   --   ALBUMIN <1.0*  <1.0* <1.0*  AST 21  --   --   ALT 17  --   --   ALKPHOS 112  --   --   BILITOT 0.4  --   --        Sedimentation Rate  Recent Labs  01/03/16 1643  ESRSEDRATE 140*   C-Reactive Protein  Recent Labs  01/05/16 0327  CRP 2.0*    Micro Results: Recent Results (from the past 720 hour(s))  Rapid strep screen     Status: None   Collection Time: 01/03/16  1:31 PM  Result Value Ref Range Status   Streptococcus, Group A Screen (Direct) NEGATIVE NEGATIVE Final    Comment: (NOTE) A Rapid Antigen test may result negative if the antigen level in the sample is below the detection level of this test. The FDA has not cleared this test as a stand-alone test therefore the rapid antigen negative result has reflexed to a Group A Strep culture.   Culture, group A strep     Status: None   Collection Time: 01/03/16  1:31 PM  Result Value Ref Range Status   Specimen Description THROAT  Final   Special Requests NONE Reflexed from [id number]  Final   Culture FEW STREPTOCOCCUS,BETA HEMOLYTIC NOT GROUP A  Final   Report Status 01/05/2016 FINAL  Final  Culture, blood (Routine X 2) w Reflex to ID Panel     Status: None (Preliminary result)   Collection Time: 01/04/16 10:38 AM  Result Value Ref Range Status   Specimen Description BLOOD LEFT ANTECUBITAL  Final   Special Requests BOTTLES DRAWN AEROBIC ONLY 5CC  Final   Culture NO GROWTH 2 DAYS  Final   Report Status PENDING  Incomplete  Culture, blood (Routine X 2) w Reflex to ID Panel     Status: None (Preliminary result)   Collection Time: 01/04/16 10:38 AM  Result Value Ref Range Status   Specimen Description BLOOD RIGHT ANTECUBITAL  Final   Special Requests BOTTLES DRAWN AEROBIC AND ANAEROBIC 6CC  Final   Culture NO GROWTH 2 DAYS  Final   Report Status PENDING  Incomplete  C difficile quick scan w PCR reflex     Status: None   Collection Time: 01/04/16  3:18 PM  Result Value Ref Range Status   C Diff antigen NEGATIVE NEGATIVE Final   C  Diff toxin NEGATIVE NEGATIVE Final   C Diff interpretation No C. difficile detected.  Final    Studies/Results: Ct Head Wo Contrast  Result Date: 01/05/2016 CLINICAL DATA:  Headaches EXAM: CT HEAD WITHOUT CONTRAST TECHNIQUE: Contiguous axial images were obtained from the base of the skull through the vertex without intravenous contrast. COMPARISON:  11/11/2009 FINDINGS: Bony calvarium is intact. No significant ventricular dilatation is seen. No findings to suggest acute hemorrhage, acute infarction or space-occupying mass lesion are seen. IMPRESSION: No acute intracranial abnormality noted. Electronically Signed   By: Inez Catalina M.D.   On: 01/05/2016 13:26   US Biopsy  Result Date: 01/05/2016 CLINICAL DATA:  Chronic renal insufficiency EXAM: ULTRASOUND GUIDED RENAL CORE BIOPSY COMPARISON:  CT 11/16/2013 TECHNIQUE: The procedure, risks (including but not limited to bleeding, infection, organ damage ), benefits, and alternatives were explained to the patient. Questions regarding the procedure were encouraged and answered. The patient understands and consents to the procedure. Survey ultrasound was performed and an appropriate skin entry site was localized. Site was marked, prepped with Betadine, draped in usual sterile fashion, infiltrated locally with 1% lidocaine. Intravenous Fentanyl and Versed were administered as conscious sedation during continuous cardiorespiratory monitoring by the radiology RN with a total moderate sedation time of 10 minutes. Under real time ultrasound guidance, a 15 gauge trocar needle was advanced to the margin of the lower pole of the right kidney for coaxial core biopsy using a 16 gauge needle for 2 passes. The core samples were submitted to pathology. The patient tolerated procedure well. COMPLICATIONS: COMPLICATIONS None. IMPRESSION: 1. Technically successful ultrasound-guided core renal biopsy , lower pole right kidney Electronically Signed   By: Lucrezia Europe M.D.   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 male with untreated HIV disease,AIDS ( Lab Results  Component Value Date   CD4TABS 80 (L) 01/03/2016    with weight loss, fevers and now concern for HIVAN    #1 FUO: multiple possible causes and he may have multiple OI's  I think the most pressing issue for me with re to making a decision in terms of getting him on ARV ASAp is to rule in or rule out cryptococcal meningitis given his headaches  Therefore I would get a lumbar puncture with :  a documented "opening pressure, and sufficient CSF removed to drop the closing pressure to below 20 cm of water  CSF for stat cryptococcal antigen CSF for protein and glucose  CSF for cell count and differential  CSF VDRL and CSF FTA- antibody (label as miscelaneous tes) both to help establish if he has Neurosyphilis  CSF for culture   And save further CSF  ( incase we need to send for Toxoplasma PCR (if he has ring enhancing lesions in Brain) EBV PCR ( similar picture), other studies  I agree M avium may be a cause of his fevers as well as other GI pathogens  Certainly his HIV itself could be cause of fevers  Hold off on empric rx for now  #2 HIVAN possibiliy: really want to treat his HIV asap because of liklihood of this but problem will be that if he has crypto giving ARV now will be dangerous due to very high risk of IRIS in CNS  When we start ARV I will likely go with INSTI regimen of Tivicay + 3tC and AZT (or ABC if we have negative HLA test and no hep b coinfection  #3 Depression: needs  support. I wish we had our Peer Support volunteer active now but he has resigned and we are brining a new one online. Perhaps I can bring a bridge counselor to meet with him (he will need emergency ADAP regardless) I will reach out to [place]  He did not see a psychiatrist he denied suicidal ideation or homicidal ideation.  He is going to need to be plugged into emergency ADAP. We can also likely get him ARV via "match" or pharma assistance though he will need multiple pills given his AKI  I spent greater than 40 minutes with the patient including greater than 50% of time in face to face counsel of the patient regarding his HIV AIDS fevers of unknown origin headaches diarrhea depression and in coordination of his care with primary team.     LOS: 3 days   Alcide Evener 01/06/2016, 3:19 PM

## 2016-01-07 ENCOUNTER — Inpatient Hospital Stay (HOSPITAL_COMMUNITY): Payer: Medicaid Other

## 2016-01-07 ENCOUNTER — Telehealth: Payer: Self-pay

## 2016-01-07 DIAGNOSIS — B20 Human immunodeficiency virus [HIV] disease: Secondary | ICD-10-CM

## 2016-01-07 DIAGNOSIS — A523 Neurosyphilis, unspecified: Secondary | ICD-10-CM

## 2016-01-07 DIAGNOSIS — Z01810 Encounter for preprocedural cardiovascular examination: Secondary | ICD-10-CM

## 2016-01-07 LAB — RENAL FUNCTION PANEL
ANION GAP: 9 (ref 5–15)
Albumin: <1 g/dL — ABNORMAL LOW (ref 3.5–5.0)
BUN: 68 mg/dL — ABNORMAL HIGH (ref 6–20)
CHLORIDE: 113 mmol/L — AB (ref 101–111)
CO2: 16 mmol/L — ABNORMAL LOW (ref 22–32)
Calcium: 7.2 mg/dL — ABNORMAL LOW (ref 8.9–10.3)
Creatinine, Ser: 7.88 mg/dL — ABNORMAL HIGH (ref 0.61–1.24)
GFR, EST AFRICAN AMERICAN: 10 mL/min — AB (ref >60)
GFR, EST NON AFRICAN AMERICAN: 8 mL/min — AB (ref >60)
Glucose, Bld: 91 mg/dL (ref 65–99)
POTASSIUM: 4.4 mmol/L (ref 3.5–5.1)
Phosphorus: 4.3 mg/dL (ref 2.5–4.6)
Sodium: 138 mmol/L (ref 135–145)

## 2016-01-07 LAB — QUANTIFERON IN TUBE
QFT TB AG MINUS NIL VALUE: 0.03 IU/mL
QUANTIFERON MITOGEN VALUE: 0.22 IU/mL
QUANTIFERON NIL VALUE: 0.06 [IU]/mL
QUANTIFERON TB AG VALUE: 0.09 [IU]/mL
QUANTIFERON TB GOLD: UNDETERMINED

## 2016-01-07 LAB — PATHOLOGIST SMEAR REVIEW: Path Review: INCREASED

## 2016-01-07 LAB — VDRL, CSF

## 2016-01-07 LAB — QUANTIFERON TB GOLD ASSAY (BLOOD)

## 2016-01-07 MED ORDER — LAMIVUDINE 150 MG PO TABS: 150.0000 mg | ORAL_TABLET | Freq: Two times a day (BID) | ORAL | 0 refills | Status: DC

## 2016-01-07 MED ORDER — LAMIVUDINE 10 MG/ML PO SOLN
50.0000 mg | Freq: Every day | ORAL | Status: DC
  Administered 2016-01-08 – 2016-01-13 (×5): 50 mg via ORAL
  Filled 2016-01-07 (×7): qty 5

## 2016-01-07 MED ORDER — ZIDOVUDINE 100 MG PO CAPS: 100.0000 mg | ORAL_CAPSULE | Freq: Two times a day (BID) | ORAL | 0 refills | Status: DC

## 2016-01-07 MED ORDER — DOLUTEGRAVIR SODIUM 50 MG PO TABS
50.0000 mg | ORAL_TABLET | Freq: Every day | ORAL | Status: DC
  Administered 2016-01-07 – 2016-01-13 (×6): 50 mg via ORAL
  Filled 2016-01-07 (×6): qty 1

## 2016-01-07 MED ORDER — ALBUTEROL SULFATE (2.5 MG/3ML) 0.083% IN NEBU
2.5000 mg | INHALATION_SOLUTION | Freq: Once | RESPIRATORY_TRACT | Status: AC
  Administered 2016-01-07: 2.5 mg via RESPIRATORY_TRACT
  Filled 2016-01-07: qty 3

## 2016-01-07 MED ORDER — PENICILLIN G POTASSIUM 5000000 UNITS IJ SOLR
4.0000 10*6.[IU] | Freq: Three times a day (TID) | INTRAVENOUS | Status: DC
  Administered 2016-01-07 – 2016-01-13 (×19): 4 10*6.[IU] via INTRAVENOUS
  Filled 2016-01-07 (×27): qty 4

## 2016-01-07 MED ORDER — ZIDOVUDINE 100 MG PO CAPS
300.0000 mg | ORAL_CAPSULE | Freq: Every day | ORAL | Status: DC
  Administered 2016-01-07 – 2016-01-13 (×6): 300 mg via ORAL
  Filled 2016-01-07 (×7): qty 3

## 2016-01-07 MED ORDER — LAMIVUDINE 150 MG PO TABS
150.0000 mg | ORAL_TABLET | Freq: Once | ORAL | Status: AC
  Administered 2016-01-07: 150 mg via ORAL
  Filled 2016-01-07: qty 1

## 2016-01-07 MED ORDER — DOLUTEGRAVIR SODIUM 50 MG PO TABS: 50.0000 mg | ORAL_TABLET | Freq: Every day | ORAL | 0 refills | Status: DC

## 2016-01-07 NOTE — Progress Notes (Signed)
1800 new med given and taken well.rested in bed thereafter. Mother at bedside

## 2016-01-07 NOTE — Telephone Encounter (Signed)
Those are the correct meds. The doses are more likely to be what he is currently on in the hospital. I had to hold off rx them until I knew he didn't have crypto meningitis. THanks so much TammY!

## 2016-01-07 NOTE — Progress Notes (Signed)
Subjective: Patient with fluoroscopic guided lumbar puncture yesterday afternoon. Not complaining of back pain or new headaches this morning. Continues to have shortness of breath and sounds extremely wheezy on physical examination. Patient stated that albuterol treatment yesterday helped with his breathing and requested it again this morning. Continues to have diarrhea. He has no additional acute complaints or concerns.  Objective:  Vital signs in last 24 hours: Vitals:   01/06/16 1300 01/06/16 1900 01/06/16 2120 01/07/16 0621  BP: (!) 149/89 (!) 149/98 (!) 149/80 134/79  Pulse: 90 93 (!) 106 (!) 104  Resp: 18  18 16   Temp:  98.9 F (37.2 C) 99.2 F (37.3 C) 100.1 F (37.8 C)  TempSrc:  Oral Oral Oral  SpO2: 99% 99% 95% 98%  Weight:    169 lb 3.2 oz (76.7 kg)  Height:       Physical Exam  Constitutional: He is oriented to person, place, and time.  Appears withdrawn and depressed  HENT:  Head: Normocephalic and atraumatic.  Minimal swelling over the right mandible compared with the left. Poor dentition with notable gingivitis and periodontitis.  Cardiovascular: Regular rhythm.  Exam reveals no gallop and no friction rub.   No murmur heard. Respiratory: Effort normal. He has wheezes.  Bilateral wheezes in all lung fields more pronounced this morning than previously  GI: Soft. He exhibits no distension. There is no tenderness.  Neurological: He is alert and oriented to person, place, and time.  Psychiatric:  Depressed and guarded     Assessment/Plan:  Jonathon Moore is a 30 year old male with AIDS (CD4 count 80) not on ART therapywho presents with a 4-5 week history of increased fatigue, cough and diarrhea found to have renal failure and neurosyphilis based on lumbar puncture.  Assessment & Plan by Problem: 1. Renal Failure -- The differential diagnosis for the patient's renal failure includes HIV-associated nephropathy, focal segmental glomerulosclerosis or other glomerular  process. -- FeNagreater than 2% indicating intrinsic renal pathology -- Hep B and Hep C antibodies- both negative -- Lipid panel- hypertriglyceridemia at 159, elevated triglycerides are consistent with nephrotic range proteinuria -- ESR elevated at 140 -- Oral sodium bicarbonate per renal recommendation, will continue 1300 mg 3 times a day -- Daily renal function panel- creatinine 7.88, CO2 16 -- Potassium normal, corrected calcium normal -- Renal biopsy 8/7 will follow-up with results  2. AIDS -- Patient states he was diagnosed with HIV 2-3 years ago and was never taking any antiretroviral therapy. -- He has not followed up with a doctor for this in the past. -- CD4 count 80 -- Infectious diseases consult will follow recommendations regarding treatment when they are made -- Acid-fast smear of blood -- Cryptococcal antigen (blood) negative -- CT scan no evidence of increased ICP -- RPR (non-treponemal) positive, will f/u with treponemal test (FTA-ABS)   3. Neurosyphilis  -- Start IV penicillin for treatment of neurosyphilis, patient will need this for approximately 2 weeks, this will be dosed by pharmacy --  lumbar puncture  - CSF culture no organisms seen  - Cryptococcal antigen negative  - CSF cell count, RBCs 46, WBC 44, segmented neutrophils 1, lymphocytes 92, eosinophils 0  - CSF protein 34, glucose 43 both of which are within normal limits  - Will follow-up with results of VDRL of CSF and cultures  4. Diarrhea  -- Patient with 4-5 week history of nonbloody diarrhea. Currently the differential diagnosis in a patient with AIDS (CD4 count 80) not on ART is broad  and includes viral, bacterial and fungal etiologies. Additionally, opportunistic infections such as Giardia, MAC and CMV. -- Acid-fast smear of stool -- C. Difficile negative -- Stool ova and parasites -- Infectious diseases consult, we'll follow the recommendations per their note -- Loperamide 2 mg as needed for  diarrhea  5. Chronic Cough -- AIDS (CD4 count 80) not on ARTcomplains of cough of one year duration with greenish sputum production. Given his HIV status differential diagnosis for his chronic cough is extremely broad but does include atypical organism such as PCP pneumonia. Results of the HIV viral load and CD4 cell count will help differentiating potential infectious etiologies. -- Chest x-ray normal -- No leukocytosis most recent white cell count of 7.0 -- Patient febrile to 100.8 overnight, currently afebrile this morning -- Quantiferon tb Gold Assay   6. Non-anion gap metabolic acidosis -- CO2 of 16, sodium 138, chloride 113 -- Differential diagnosis includes renal tubular acidosis and chronic diarrhea -- Renal labs as above -- Oral sodium bicarbonate 1200 mg 3 times a day -- Renal biopsy on 8/7 will follow-up with results    7. Fever -- Patient febrile to 100.1 overnight, currently afebrile -- Normal chest x-ray -- Urinalysis not suspicious for infection -- Blood cultures no growth to date -- CSF no organisms identified, lymphocytic leukocytosis with normal glucose and protein   8. Mucosal Bleeding, resolved -- No additional episodes -- Discontinued prophylactic heparin -- INR normal -- Hemoglobin 9.2 -- Platelet function normal -- We'll continue to monitor  Dispo: Anticipated discharge in approximately 2-3 day(s).   Jonathon Moore, Jonathon Moore 01/07/2016, 8:23 AM Pager: [phone]

## 2016-01-07 NOTE — Progress Notes (Signed)
Upper Extremity Vein Map    Right Cephalic  Segment Diameter Depth Comment  1. Axilla mm mm   2. Mid upper arm 1.60mm mm   3. Above [facility] 1.36mm mm   4. In [facility] 1.15mm mm   5. Below AC mm mm   6. Mid forearm mm mm   7. Wrist mm mm    mm mm    mm mm    mm mm    Right Basilic  Segment Diameter Depth Comment  1. Axilla mm mm   2. Mid upper arm 5.25mm 7.105mm   3. Above AC 5.13mm 6.75mm   4. In [facility] 3.3mm 4.34mm Multiple branches  5. Below AC mm mm   6. Mid forearm mm mm   7. Wrist mm mm    mm mm    mm mm    mm mm      Left Cephalic too small to visualize   Left Basilic  Segment Diameter Depth Comment  1. Axilla mm mm   2. Mid upper arm 4.9mm 7.78mm   3. Above [facility] 4.5mm 4.35mm   4. In [facility] 3.89mm mm Multiple branches  5. Below AC mm mm   6. Mid forearm mm mm   7. Wrist mm mm    mm mm    mm mm    mm mm     Landry Mellow, RDMS, RVT 01/07/2016

## 2016-01-07 NOTE — Telephone Encounter (Signed)
Case Manager with [facility] visited patient upon the request of Dr Tommy Medal to start the process for ADAP.  He is in need of HIV medications on an urgent basis.  Verbal order given by Dr Tommy Medal for Tivicay, Epivir and Retrovir to begin process.   Application will be submitted today for consideration for services related to HIV care.   Laverle Patter, RN

## 2016-01-07 NOTE — Progress Notes (Signed)
[facility] Rounding Note Subjective: More withdrawn today.   Still some soreness at biopsy site. Had LP yesterday and tolerated well. Making good urine.  Has not noticed any blood in his urine.  Objective: BP 134/79 (BP Location: Left Arm)   Pulse (!) 104   Temp 100.1 F (37.8 C) (Oral) Comment: fred notified  Resp 16   Ht 6' (1.829 m) Comment: from previous admission  Wt 169 lb 3.2 oz (76.7 kg) Comment: scale c  SpO2 98%   BMI 22.95 kg/m    Intake/Output Summary (Last 24 hours) at 01/07/16 0832 Last data filed at 01/07/16 0622  Gross per 24 hour  Intake              600 ml  Output              750 ml  Net             -150 ml    Weight change:   EXAM: General: lying on his side in bed, more withdrawn, eating breakfast. HEENT: poor dentition, EOMI, no scleral icterus Pulm: normal effort Ext: warm and well perfused, no pedal edema Neuro: alert and oriented X3, cranial nerves II-XII grossly intact Psych: reserved, withdrawn, pleasant  Labs: Basic Metabolic Panel:  Recent Labs Lab 01/04/16 1038 01/05/16 0328 01/05/16 0758 01/07/16 0501  NA 140 139 139 138  K 4.4 4.1 4.6 4.4  CL 118* 118* 119* 113*  CO2 16* 16* 16* 16*  GLUCOSE 78 104* 91 91  BUN 52* 55* 57* 68*  CREATININE 7.68* 7.96* 8.09* 7.88*  CALCIUM 7.1* 7.1* 7.2* 7.2*  PHOS 3.6 3.9  --  4.3    Liver Function Tests:  Recent Labs Lab 01/03/16 1046  01/04/16 0505 01/04/16 1038 01/05/16 0328 01/07/16 0501  AST 26  --  21  --   --   --   ALT 19  --  17  --   --   --   ALKPHOS 129*  --  112  --   --   --   BILITOT 0.4  --  0.4  --   --   --   PROT 5.7*  --  5.2*  --   --   --   ALBUMIN <1.0*  < > <1.0* <1.0* <1.0* <1.0*  < > = values in this interval not displayed.  Recent Labs Lab 01/03/16 1046  LIPASE 25   CBC:  Recent Labs Lab 01/03/16 1046 01/04/16 1038 01/04/16 1803 01/05/16 0327  WBC 8.5 8.5 8.5 7.0  NEUTROABS  --  4.3  --  3.8  HGB 10.7* 10.2* 9.0* 9.2*  HCT 32.2* 30.5* 27.3*  27.8*  MCV 88.5 87.6 88.9 88.8  PLT 336 285 249 262    Cardiac Enzymes:  Recent Labs Lab 01/03/16 1549  CKTOTAL 38*    Studies/Results: Ct Head Wo Contrast  Result Date: 01/05/2016 CLINICAL DATA:  Headaches EXAM: CT HEAD WITHOUT CONTRAST TECHNIQUE: Contiguous axial images were obtained from the base of the skull through the vertex without intravenous contrast. COMPARISON:  11/11/2009 FINDINGS: Bony calvarium is intact. No significant ventricular dilatation is seen. No findings to suggest acute hemorrhage, acute infarction or space-occupying mass lesion are seen. IMPRESSION: No acute intracranial abnormality noted. Electronically Signed   By: Inez Catalina M.D.   On: 01/05/2016 13:26   US Biopsy  Result Date: 01/05/2016 CLINICAL DATA:  Chronic renal insufficiency EXAM: ULTRASOUND GUIDED RENAL CORE BIOPSY COMPARISON:  CT 11/16/2013 TECHNIQUE: The procedure, risks (  including but not limited to bleeding, infection, organ damage ), benefits, and alternatives were explained to the patient. Questions regarding the procedure were encouraged and answered. The patient understands and consents to the procedure. Survey ultrasound was performed and an appropriate skin entry site was localized. Site was marked, prepped with Betadine, draped in usual sterile fashion, infiltrated locally with 1% lidocaine. Intravenous Fentanyl and Versed were administered as conscious sedation during continuous cardiorespiratory monitoring by the radiology RN with a total moderate sedation time of 10 minutes. Under real time ultrasound guidance, a 15 gauge trocar needle was advanced to the margin of the lower pole of the right kidney for coaxial core biopsy using a 16 gauge needle for 2 passes. The core samples were submitted to pathology. The patient tolerated procedure well. COMPLICATIONS: COMPLICATIONS None. IMPRESSION: 1. Technically successful ultrasound-guided core renal biopsy , lower pole right kidney Electronically Signed    By: Lucrezia Europe M.D.   On: 01/05/2016 16:42   Dg Fluoro Guided Loc Of Needle/cath Tip For Spinal Inject Rt  Result Date: 01/06/2016 CLINICAL DATA:  AIDS (acquired immune deficiency syndrome) (HCC) B20 (ICD-10-CM) EXAM: DIAGNOSTIC LUMBAR PUNCTURE UNDER FLUOROSCOPIC GUIDANCE FLUOROSCOPY TIME:  Fluoroscopy Time (in minutes and seconds): 0 minutes and 6 seconds Number of Acquired Images:  1 PROCEDURE: Informed consent was obtained from the patient prior to the procedure, including potential complications of headache, allergy, and pain. With the patient prone, the lower back was prepped with Betadine. 1% Lidocaine was used for local anesthesia. Lumbar puncture was performed at the L3-L4 level using a 22 gauge needle with return of clear CSF with an opening pressure of 7 cm water. 10 ml of CSF were obtained for laboratory studies. The patient tolerated the procedure well and there were no apparent complications. IMPRESSION: Successful fluoroscopically guided lumbar puncture with acquisition of 10 mL of clear CSF. Electronically Signed   By: Lajean Manes M.D.   On: 01/06/2016 18:29   Background: 30 y.o. year-old [place] man with PMH of bronchitis and HIV diagnosed in 2014 which has never been treated. 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 In the ED he was found to have a creatinine of 8.09, bicarb of 16, serum albumin <1, UA with >300 protein, 6-30 RBC's. Renal ultrasound ->12.5, 12.7 cm kidneys, echodense.  He was given a liter of NS in the ED and then admitted for further evaluation. Labs that have returned so far->neg Hep B,C, ESR 140. ANCA, ANA, complements, cryoglobulins are all pending. HIV-1 RNA also pending. We are asked to see.  Assessment/Recommendations 1.  Renal Failure: with proteinuria and hematuria.   Normal renal function  in Oct 2016 with creatinine of 1.08.  He presents with creatinine of 8 and proteinuria/microscopic hematuria (UPC could not be calculated d/t protein concentration ">3000") in the setting of untreated HIV.  Likely HIVAN No uremic symptoms currently, volume looks okay, electrolytes okay, acidosis about the same. Not sure how much improvement we could reasonably expect with treatment of his HIV.   Renal US with markedly echogenic kidneys identified bilaterally.  The appearance is nonspecific but may be seen with viral nephropathy. Awaiting pathology from renal biopsy. Vein mapping ordered. Educational HD videos have not been shown to patient.  I asked RN to show him these. - FeNagreater  than 2% indicating intrinsic renal pathology - renal biopsy done, awaiting results - educational HD videos ordered and have asked RN to show these -  vein mapping bilateral upper extremities ordered to begin access planning.  Discussed this with patient. - renal function more-or-less the same.  Corrected calcium normal.  Still having urine output. - discussed with primary service and they will be discussing with IR regarding a tunneled IJ catheter for IV antibiotics.  2.  Metabolic acidosis in setting of renal failure - Bicarb 16 today - on oral sodium bicarb started by primary service.  3.  Anemia: likely due to CKD - Ferritin 839, Iron 46, Saturation ratio 43% - Aranesp or EPO may be needed if Hgb continues to stay below 10  4. HIV - Since 2014. Untreated. Family is not aware of his diagnosis. - CD 4 80 - VL processing  5. Diarrhea, weight loss - stools for CDiff, O and P, AFB  ordered  6.  Fever  - blood cultures, quant gold, stool studies all ordered/ pending. ID following.  7.  Depression - consider starting anti-depressant.  Patient was not interested in having a counselor come to speak with him when we asked this morning.   Jule Ser  8:32 AM  Renal Attending;: He is very depressed  about his new diagnoses. We are trying to educate him about renal prognsis.  Otherwise, agree with note articulated above.

## 2016-01-07 NOTE — Progress Notes (Signed)
Subjective: Fatigue and seemingly withdrawn  Antibiotics:  Anti-infectives    Start     Dose/Rate Route Frequency Ordered Stop   01/08/16 1000  lamiVUDine (EPIVIR) 10 MG/ML solution 50 mg     50 mg Oral Daily 01/07/16 1636     01/07/16 1700  dolutegravir (TIVICAY) tablet 50 mg     50 mg Oral Daily 01/07/16 1636     01/07/16 1700  zidovudine (RETROVIR) capsule 300 mg     300 mg Oral Daily 01/07/16 1636     01/07/16 1645  lamiVUDine (EPIVIR) tablet 150 mg     150 mg Oral Once 01/07/16 1636     01/07/16 0915  penicillin G potassium 4 Million Units in dextrose 5 % 250 mL IVPB     4 Million Units 250 mL/hr over 60 Minutes Intravenous Every 8 hours 01/07/16 0855        Medications: Scheduled Meds: . dolutegravir  50 mg Oral Daily  . feeding supplement (ENSURE ENLIVE)  237 mL Oral Q24H  . [START ON 01/08/2016] lamiVUDine  50 mg Oral Daily  . lamiVUDine  150 mg Oral Once  . multivitamin with minerals  1 tablet Oral Daily  . pencillin G potassium IV  4 Million Units Intravenous Q8H  . sodium bicarbonate  1,300 mg Oral TID  . zidovudine  300 mg Oral Daily   Continuous Infusions:  PRN Meds:.acetaminophen, loperamide, phenol    Objective: Weight change:   Intake/Output Summary (Last 24 hours) at 01/07/16 1638 Last data filed at 01/07/16 1234  Gross per 24 hour  Intake              240 ml  Output              600 ml  Net             -360 ml   Blood pressure 128/83, pulse 99, temperature 99.5 F (37.5 C), temperature source Oral, resp. rate 18, height 6' (1.829 m), weight 169 lb 3.2 oz (76.7 kg), SpO2 96 %. Temp:  [98.9 F (37.2 C)-100.1 F (37.8 C)] 99.5 F (37.5 C) (08/09 1100) Pulse Rate:  [93-106] 99 (08/09 1100) Resp:  [16-18] 18 (08/09 1100) BP: (128-149)/(79-98) 128/83 (08/09 1100) SpO2:  [95 %-99 %] 96 % (08/09 1100) Weight:  [169 lb 3.2 oz (76.7 kg)] 169 lb 3.2 oz (76.7 kg) (08/09 1607)  Physical Exam: General: Alert and awake, oriented Withdrawn  seemingly depressed  CVS regular rate, normal r,  no murmur rubs or gallops Chest: clear to auscultation bilaterally, no wheezing, rales or rhonchi Abdomen: soft nontender, nondistended, normal bowel sounds, Extremities: no  clubbing or edema noted bilaterally Skin: no rashes Neuro: nonfocal  CBC: CBC Latest Ref Rng & Units 01/05/2016 01/04/2016 01/04/2016  WBC 4.0 - 10.5 K/uL 7.0 8.5 8.5  Hemoglobin 13.0 - 17.0 g/dL 9.2(L) 9.0(L) 10.2(L)  Hematocrit 39.0 - 52.0 % 27.8(L) 27.3(L) 30.5(L)  Platelets 150 - 400 K/uL 262 249 285      BMET  Recent Labs  01/05/16 0758 01/07/16 0501  NA 139 138  K 4.6 4.4  CL 119* 113*  CO2 16* 16*  GLUCOSE 91 91  BUN 57* 68*  CREATININE 8.09* 7.88*  CALCIUM 7.2* 7.2*     Liver Panel   Recent Labs  01/05/16 0328 01/07/16 0501  ALBUMIN <1.0* <1.0*       Sedimentation Rate No results for input(s): ESRSEDRATE in the last 72 hours.  C-Reactive Protein  Recent Labs  01/05/16 0327  CRP 2.0*    Micro Results: Recent Results (from the past 720 hour(s))  Rapid strep screen     Status: None   Collection Time: 01/03/16  1:31 PM  Result Value Ref Range Status   Streptococcus, Group A Screen (Direct) NEGATIVE NEGATIVE Final    Comment: (NOTE) A Rapid Antigen test may result negative if the antigen level in the sample is below the detection level of this test. The FDA has not cleared this test as a stand-alone test therefore the rapid antigen negative result has reflexed to a Group A Strep culture.   Culture, group A strep     Status: None   Collection Time: 01/03/16  1:31 PM  Result Value Ref Range Status   Specimen Description THROAT  Final   Special Requests NONE Reflexed from [id number]  Final   Culture FEW STREPTOCOCCUS,BETA HEMOLYTIC NOT GROUP A  Final   Report Status 01/05/2016 FINAL  Final  Culture, blood (Routine X 2) w Reflex to ID Panel     Status: None (Preliminary result)   Collection Time: 01/04/16 10:38 AM  Result Value  Ref Range Status   Specimen Description BLOOD LEFT ANTECUBITAL  Final   Special Requests BOTTLES DRAWN AEROBIC ONLY 5CC  Final   Culture NO GROWTH 3 DAYS  Final   Report Status PENDING  Incomplete  Culture, blood (Routine X 2) w Reflex to ID Panel     Status: None (Preliminary result)   Collection Time: 01/04/16 10:38 AM  Result Value Ref Range Status   Specimen Description BLOOD RIGHT ANTECUBITAL  Final   Special Requests BOTTLES DRAWN AEROBIC AND ANAEROBIC 6CC  Final   Culture NO GROWTH 3 DAYS  Final   Report Status PENDING  Incomplete  C difficile quick scan w PCR reflex     Status: None   Collection Time: 01/04/16  3:18 PM  Result Value Ref Range Status   C Diff antigen NEGATIVE NEGATIVE Final   C Diff toxin NEGATIVE NEGATIVE Final   C Diff interpretation No C. difficile detected.  Final  Acid Fast Smear (AFB)     Status: None   Collection Time: 01/05/16  2:09 PM  Result Value Ref Range Status   AFB Specimen Processing Concentration  Final    Comment: (NOTE) Performed At: [facility] [place], [place] [id number] Lindon Romp MD [id number]    Acid Fast Smear NOSMR  Final    Comment: (NOTE) The acid-fast bacilli (AFB) smear will not be performed due to the specimen source (blood) or submission of an insufficient quantity of specimen.    Source (AFB) BLD  Final  CSF culture     Status: None (Preliminary result)   Collection Time: 01/06/16  5:30 PM  Result Value Ref Range Status   Specimen Description CSF  Final   Special Requests NONE  Final   Gram Stain   Final    WBC PRESENT, PREDOMINANTLY MONONUCLEAR NO ORGANISMS SEEN CYTOSPIN    Culture NO GROWTH < 24 HOURS  Final   Report Status PENDING  Incomplete    Studies/Results: Dg Fluoro Guided Loc Of Needle/cath Tip For Spinal Inject Rt  Result Date: 01/06/2016 CLINICAL DATA:  AIDS (acquired immune deficiency syndrome) (HCC) B20 (ICD-10-CM) EXAM: DIAGNOSTIC LUMBAR PUNCTURE UNDER  FLUOROSCOPIC GUIDANCE FLUOROSCOPY TIME:  Fluoroscopy Time (in minutes and seconds): 0 minutes and 6 seconds Number of Acquired Images:  1 PROCEDURE: Informed consent was  obtained from the patient prior to the procedure, including potential complications of headache, allergy, and pain. With the patient prone, the lower back was prepped with Betadine. 1% Lidocaine was used for local anesthesia. Lumbar puncture was performed at the L3-L4 level using a 22 gauge needle with return of clear CSF with an opening pressure of 7 cm water. 10 ml of CSF were obtained for laboratory studies. The patient tolerated the procedure well and there were no apparent complications. IMPRESSION: Successful fluoroscopically guided lumbar puncture with acquisition of 10 mL of clear CSF. Electronically Signed   By: Lajean Manes M.D.   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 male with untreated HIV disease,AIDS ( Lab Results  Component Value Date   CD4TABS 80 (L) 01/03/2016    with weight loss, fevers and now concern for HIVAN  Also with Neurosyphilis  #1 FUO: multiple possible causes including his HIV itself, his syphilis and mx other possible OI's fortunately no crypto  Our treating his neurosyphilis as well as his HIV.  Hold off on starting other treatments empirically for anything at this point  #2 Neurosyphilis: Started penicillin and he will need 2 weeks intravenously with IV  penicillin, he will need a dedicated line for this    #2 HIV AIDS with HIVAN: Starting TIVICAY plus AZT and Epivir. These meds should bring down viral load rapidly. If he is HLAB701 negative we can exchange the AZT for Abacavir  Starr [place] and Sharyn Lull from [facility] have come and filled out paperwork with the patient for Emergency ADAP   #3 Depression: needs support. I wish we had our Peer Support volunteer active now but he has resigned and we are brining a new one online. Perhaps I can bring a bridge counselor to meet with him (he will need emergency ADAP regardless) I will reach out to Starr [place]  He did not see a psychiatrist he denied suicidal ideation or homicidal ideation.    I spent greater than 40 minutes with the patient including greater than 50% of time in face to face counsel of the patient regarding his HIV AIDS syphilis, fevers of unknown origin headaches diarrhea depression and in coordination of his care with primary team, [facility], and Nephrology     LOS: 4 days   Alcide Evener 01/07/2016, 4:38 PM

## 2016-01-08 ENCOUNTER — Inpatient Hospital Stay (HOSPITAL_COMMUNITY): Payer: Medicaid Other

## 2016-01-08 ENCOUNTER — Other Ambulatory Visit: Payer: Self-pay | Admitting: *Deleted

## 2016-01-08 DIAGNOSIS — N179 Acute kidney failure, unspecified: Secondary | ICD-10-CM

## 2016-01-08 DIAGNOSIS — N29 Other disorders of kidney and ureter in diseases classified elsewhere: Secondary | ICD-10-CM

## 2016-01-08 DIAGNOSIS — A523 Neurosyphilis, unspecified: Secondary | ICD-10-CM

## 2016-01-08 DIAGNOSIS — B20 Human immunodeficiency virus [HIV] disease: Secondary | ICD-10-CM

## 2016-01-08 LAB — RENAL FUNCTION PANEL
Anion gap: 6 (ref 5–15)
BUN: 71 mg/dL — AB (ref 6–20)
CO2: 17 mmol/L — ABNORMAL LOW (ref 22–32)
CREATININE: 8.17 mg/dL — AB (ref 0.61–1.24)
Calcium: 7.1 mg/dL — ABNORMAL LOW (ref 8.9–10.3)
Chloride: 114 mmol/L — ABNORMAL HIGH (ref 101–111)
GFR, EST AFRICAN AMERICAN: 9 mL/min — AB (ref >60)
GFR, EST NON AFRICAN AMERICAN: 8 mL/min — AB (ref >60)
Glucose, Bld: 93 mg/dL (ref 65–99)
PHOSPHORUS: 5.1 mg/dL — AB (ref 2.5–4.6)
Potassium: 4.3 mmol/L (ref 3.5–5.1)
Sodium: 137 mmol/L (ref 135–145)

## 2016-01-08 LAB — CBC
HEMATOCRIT: 25.6 % — AB (ref 39.0–52.0)
HEMOGLOBIN: 8.4 g/dL — AB (ref 13.0–17.0)
MCH: 28.9 pg (ref 26.0–34.0)
MCHC: 32.8 g/dL (ref 30.0–36.0)
MCV: 88 fL (ref 78.0–100.0)
Platelets: 239 10*3/uL (ref 150–400)
RBC: 2.91 MIL/uL — AB (ref 4.22–5.81)
RDW: 14.7 % (ref 11.5–15.5)
WBC: 6.7 10*3/uL (ref 4.0–10.5)

## 2016-01-08 LAB — ACID FAST SMEAR (AFB, MYCOBACTERIA)

## 2016-01-08 LAB — OVA + PARASITE EXAM

## 2016-01-08 LAB — ACID FAST SMEAR (AFB): ACID FAST SMEAR - AFSCU2: NEGATIVE

## 2016-01-08 LAB — O&P RESULT

## 2016-01-08 LAB — HLA B*5701: HLA B 5701: NEGATIVE

## 2016-01-08 MED ORDER — ZIDOVUDINE 100 MG PO CAPS: 100.0000 mg | ORAL_CAPSULE | Freq: Two times a day (BID) | ORAL | 4 refills | Status: DC

## 2016-01-08 MED ORDER — DOLUTEGRAVIR SODIUM 50 MG PO TABS: 50.0000 mg | ORAL_TABLET | Freq: Every day | ORAL | 4 refills | Status: DC

## 2016-01-08 MED ORDER — LORAZEPAM 2 MG/ML IJ SOLN
0.5000 mg | Freq: Once | INTRAMUSCULAR | Status: AC
  Administered 2016-01-09: 0.5 mg via INTRAVENOUS
  Filled 2016-01-08: qty 1

## 2016-01-08 MED ORDER — LAMIVUDINE 150 MG PO TABS: 150.0000 mg | ORAL_TABLET | Freq: Two times a day (BID) | ORAL | 4 refills | Status: DC

## 2016-01-08 NOTE — Progress Notes (Signed)
Friends and family are bringing patient food and drinks in for the patient to have. Patient is aware that he is on a special diet, he stated the food is not good from the hospital.

## 2016-01-08 NOTE — Progress Notes (Signed)
Initial Nutrition Assessment   INTERVENTION:  Continue Ensure Enlive po once daily, each supplement provides 350 kcal and 20 grams of protein Provide Multivitamin with minerals daily Provide snacks TID   NUTRITION DIAGNOSIS:   Increased nutrient needs related to chronic illness as evidenced by estimated needs.  Ongoing  GOAL:   Patient will meet greater than or equal to 90% of their needs  Unmet  MONITOR:   PO intake, Supplement acceptance, Labs, Weight trends, Skin  REASON FOR ASSESSMENT:   Malnutrition Screening Tool    ASSESSMENT:   30 year old man with about a 2 year history of HIV but treatment nave came to the emergency department yesterday because of several weeks of progressive dyspnea with exertion, food aversion, and unintentional weight loss. He reports that the thing that bothering him the most is nausea and lower abdominal pain. He's been having diarrhea with about 5-10 watery bowel movements per day.  Pt states that his appetite is good, but he would be eating more "if the food didn't suck". Per nursing notes, pt is eating 100% of most meals. Pt states he is drinking Ensure 1-2 times per day. He would like to receive snacks. RD briefly reviewed the renal diet restrictions. Provided handout "Food Pyramid for Healthy Eating with Kidney Disease" with list of good foods as well as list of foods to avoid. Encouraged patient to be mindful of food choices, but did not strongly advise adherence to diet. Encouraged patient to ask nephrologist about how strict he needs to be with the renal diet.  Per chart, VVS consulted to assess patient for permanent dialysis access; ESRD or near ESRD.   Labs: low hemoglobin, elevated chloride, high creatinine, high BUN, low albumin, low GFR  Diet Order:  Diet renal with fluid restriction Fluid restriction: 1200 mL Fluid; Room service appropriate? Yes; Fluid consistency: Thin Diet NPO time specified  Skin:  Reviewed, no  issues  Last BM:  8/9  Height:   Ht Readings from Last 1 Encounters:  01/05/16 6' (1.829 m)    Weight:   Wt Readings from Last 1 Encounters:  01/08/16 170 lb 6.4 oz (77.3 kg)    Ideal Body Weight:  80.9 kg  BMI:  Body mass index is 23.11 kg/m.  Estimated Nutritional Needs:   Kcal:  9798-9211  Protein:  90-105 grams  Fluid:  2.7 L/day  EDUCATION NEEDS:   No education needs identified at this time  [place], LDN Inpatient Clinical Dietitian Pager: [phone] After Hours Pager: [phone]

## 2016-01-08 NOTE — Progress Notes (Signed)
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 Jonathon Moore C

## 2016-01-08 NOTE — Progress Notes (Signed)
[facility] Rounding Note Subjective: Feeling okay this morning.   Stomach hurts a little because he is hungry he thinks. Still making good urine.  Has not noticed any blood in his urine. Vein mapping done yesterday.  Objective: BP 121/72 (BP Location: Right Arm)   Pulse 87   Temp 99 F (37.2 C) (Oral)   Resp 18   Ht 6' (1.829 m) Comment: from previous admission  Wt 170 lb 6.4 oz (77.3 kg) Comment: scale c  SpO2 99%   BMI 23.11 kg/m    Intake/Output Summary (Last 24 hours) at 01/08/16 1041 Last data filed at 01/08/16 0903  Gross per 24 hour  Intake             2340 ml  Output              550 ml  Net             1790 ml    Weight change: 1 lb 3.2 oz (0.544 kg)  EXAM: General: lying on his side in bed, withdrawn, but more talkative today. HEENT: poor dentition, EOMI, no scleral icterus Pulm: normal effort, bilateral wheezes Cardiac: RRR Ext: warm and well perfused, no pedal edema Neuro: alert and oriented X3, cranial nerves II-XII grossly intact Psych: reserved, withdrawn, pleasant  Labs: Basic Metabolic Panel:  Recent Labs Lab 01/05/16 0328 01/05/16 0758 01/07/16 0501 01/08/16 0330  NA 139 139 138 137  K 4.1 4.6 4.4 4.3  CL 118* 119* 113* 114*  CO2 16* 16* 16* 17*  GLUCOSE 104* 91 91 93  BUN 55* 57* 68* 71*  CREATININE 7.96* 8.09* 7.88* 8.17*  CALCIUM 7.1* 7.2* 7.2* 7.1*  PHOS 3.9  --  4.3 5.1*    Liver Function Tests:  Recent Labs Lab 01/03/16 1046  01/04/16 0505  01/05/16 0328 01/07/16 0501 01/08/16 0330  AST 26  --  21  --   --   --   --   ALT 19  --  17  --   --   --   --   ALKPHOS 129*  --  112  --   --   --   --   BILITOT 0.4  --  0.4  --   --   --   --   PROT 5.7*  --  5.2*  --   --   --   --   ALBUMIN <1.0*  < > <1.0*  < > <1.0* <1.0* <1.0*  < > = values in this interval not displayed.  Recent Labs Lab 01/03/16 1046  LIPASE 25   CBC:  Recent Labs Lab 01/03/16 1046 01/04/16 1038 01/04/16 1803 01/05/16 0327 01/08/16 0330  WBC 8.5  8.5 8.5 7.0 6.7  NEUTROABS  --  4.3  --  3.8  --   HGB 10.7* 10.2* 9.0* 9.2* 8.4*  HCT 32.2* 30.5* 27.3* 27.8* 25.6*  MCV 88.5 87.6 88.9 88.8 88.0  PLT 336 285 249 262 239    Cardiac Enzymes:  Recent Labs Lab 01/03/16 1549  CKTOTAL 14*    Studies/Results: Dg Fluoro Guided Loc Of Needle/cath Tip For Spinal Inject Rt  Result Date: 01/06/2016 CLINICAL DATA:  AIDS (acquired immune deficiency syndrome) (HCC) B20 (ICD-10-CM) EXAM: DIAGNOSTIC LUMBAR PUNCTURE UNDER FLUOROSCOPIC GUIDANCE FLUOROSCOPY TIME:  Fluoroscopy Time (in minutes and seconds): 0 minutes and 6 seconds Number of Acquired Images:  1 PROCEDURE: Informed consent was obtained from the patient prior to the procedure, including potential complications of headache, allergy, and  pain. With the patient prone, the lower back was prepped with Betadine. 1% Lidocaine was used for local anesthesia. Lumbar puncture was performed at the L3-L4 level using a 22 gauge needle with return of clear CSF with an opening pressure of 7 cm water. 10 ml of CSF were obtained for laboratory studies. The patient tolerated the procedure well and there were no apparent complications. IMPRESSION: Successful fluoroscopically guided lumbar puncture with acquisition of 10 mL of clear CSF. Electronically Signed   By: Lajean Manes M.D.   On: 01/06/2016 18:29   Background: 30 y.o. year-old [place] man with PMH of bronchitis and HIV diagnosed in 2014 which has never been treated. 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 In the ED he was found to have a creatinine of 8.09, bicarb of 16, serum albumin <1, UA with >300 protein, 6-30 RBC's. Renal ultrasound ->12.5, 12.7 cm kidneys, echodense.  He was given a liter of NS in the ED and then admitted for further evaluation. Labs that have returned so far->neg  Hep B,C, ESR 140. ANCA, ANA, complements, cryoglobulins are all pending. HIV-1 RNA also pending. We are asked to see.  Assessment/Recommendations 1.  Renal Failure: with proteinuria and hematuria.   Normal renal function in Oct 2016 with creatinine of 1.08.  He presents with creatinine of 8 and proteinuria/microscopic hematuria (UPC could not be calculated d/t protein concentration ">3000") in the setting of untreated HIV.  No uremic symptoms currently, volume looks okay, electrolytes okay, acidosis relatively unchanged. Not sure how much improvement we could reasonably expect with treatment of his HIV.   Renal US with markedly echogenic kidneys identified bilaterally.  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 - Vein mapping completed yesterday. - VVS consulted to see patient regarding planning for permanent dialysis access. - renal function remains about the same  2.  Metabolic acidosis in setting of renal failure - Bicarb 17 today - on oral sodium bicarb started by primary service.  3.  Anemia: likely due to CKD - Ferritin 839, Iron 46, Saturation ratio 43% - Aranesp or EPO may be needed if Hgb continues to stay below 10  4. HIV - Since 2014. Untreated. Family is not aware of his diagnosis. - started on ART by ID  5. Diarrhea, weight loss - stools for CDiff, O and P, AFB  ordered  6.  Fever  - blood cultures, quant gold, stool studies all ordered/ pending. ID following.  7.  Depressed mood - could consider starting anti-depressant, but also may just need some time to come to terms with all his new diagnosis'.  He is more interactive this morning.   Jule Ser  10:41 AM   Renal Attending: He has HIVAN with some ATN, but I suspect he has ESRD or near ESRD.  He is adjusting to the new information. He will need a dialysis access placed, preferably prior to  discharge. Maira Christon C

## 2016-01-08 NOTE — Progress Notes (Signed)
Subjective: No acute events overnight. Yesterday, patient was started on antiretroviral therapy and IV penicillin for treatment of his HIV and neurosyphilis respectively. He was complaining of a mild headache this morning but this was not any worse than his usual headaches. He has no additional acute complaints or concerns this morning.  Objective:  Vital signs in last 24 hours: Vitals:   01/07/16 1100 01/07/16 2043 01/08/16 0627 01/08/16 1217  BP: 128/83 (!) 148/83 121/72 134/84  Pulse: 99 98 87 91  Resp: _0 Temp: 99.5 F (37.5 C) (!) 100.9 F (38.3 C) 99 F (37.2 C) 100 F (37.8 C)  TempSrc: Oral Oral Oral Oral  SpO2: 96% 100% 99% 98%  Weight:   170 lb 6.4 oz (77.3 kg)   Height:       Physical Exam  Constitutional: He is oriented to person, place, and time.  Patient's mood and affect improved today  HENT:  Head: Normocephalic and atraumatic.  Poor dentition with obvious gingivitis   Cardiovascular: Normal rate and regular rhythm.  Exam reveals no gallop and no friction rub.   No murmur heard. Respiratory: Effort normal. He has wheezes.  Bilateral wheezes heard in all lung fields  GI: Soft. Bowel sounds are normal. He exhibits no distension. There is no tenderness.  No abdominal bruits auscultated  Musculoskeletal: He exhibits no edema or deformity.  Neurological: He is alert and oriented to person, place, and time.  Psychiatric:  Mood and affect improved today. Patient less guarded and more conversational today.     Assessment/Plan:  Jonathon Moore is a 30 year old male with AIDS (CD4 count 80) not on ART ( started ART on 01/07/2016) therapywho presents with a 4-5 week history of increased fatigue, cough and diarrhea found to have renal failure and neurosyphilis based on lumbar puncture.  In brief, patient was started on antiretroviral therapy and IV penicillin yesterday evening. Vascular surgery has been consulted for the placement of a single lumen tunneled IJ  catheter for long-term anabiotic use. We'll continue to wait and follow-up with results of the renal pathology. Additionally, we should consider starting the patient on prophylactic antibiotics given his CD4 count of 80. For detailed assessment and plan please see below.  Assessment & Plan by Problem: 1. Renal Failure -- The differential diagnosis for the patient's renal failure includes HIV-associated nephropathy, focal segmental glomerulosclerosis or other glomerular process. -- FeNagreater than 2% indicating intrinsic renal pathology -- Hep B and Hep C antibodies- both negative -- Lipid panel- hypertriglyceridemia at 159, elevated triglycerides are consistent with nephrotic range proteinuria -- ESR elevated at 140 -- Oral sodium bicarbonate per renal recommendation, will continue 1300 mg 3 times a day -- Current renal function panel- creatinine 8.17, CO2 17 -- We'll space renal function panels to every third day -- Potassium normal, corrected calcium normal -- Renal biopsy 8/7 will follow-up with results  2. AIDS -- Patient states he was diagnosed with HIV 2-3 years ago and was never taking any antiretroviral therapy. -- He has not followed up with a doctor for this in the past. -- CD4 count 80 -- Patient started on TIVICAY plus AZT and Epivir. -- Pending results of HLA B701 we'll exchange AZT for abacavir -- Acid-fast smear of blood -- Cryptococcal antigen (blood) negative -- CT scan no evidence of increased ICP -- RPR (non-treponemal) positive, will f/u with treponemal test (FTA-ABS)   3. Neurosyphilis  -- Start IV penicillin for treatment of neurosyphilis, patient will need this  for approximately 2 weeks, this will be dosed by pharmacy -- Vascular surgery consulted  for a single lumen tunneled IJ catheter for long-term antibiotic use  4. Diarrhea  -- Patient with 4-5 week history of nonbloody diarrhea. Currently the differential diagnosis in a patient with AIDS (CD4 count  80)not on ART is broad and includes viral, bacterial and fungal etiologies. Additionally, opportunistic infections such as Giardia, MAC and CMV. -- ART started -- Acid-fast smear of stool -- C. Difficile negative -- Stool ova and parasites -- Infectious diseases consult, we'll follow the recommendations per their note -- Loperamide 2 mg as needed for diarrhea  5. Chronic Cough -- AIDS (CD4 count 80) not on ARTcomplains of cough of one year duration with greenish sputum production. Given his HIV status differential diagnosis for his chronic cough is extremely broad but does include atypical organism such as PCP pneumonia. Results of the HIV viral load and CD4 cell count will help differentiating potential infectious etiologies. -- Consider starting prophylactic medication based on patient's CD4 count for PJP pneumonia -- Chest x-ray normal -- No leukocytosis most recent white cell count of 8.4 -- Quantiferon tb Gold Assay  6. Non-anion gap metabolic acidosis -- CO2 of 17, sodium 133, chloride 114 -- Differential diagnosis includes renal tubular acidosis and chronic diarrhea -- Renal labs as above -- Oral sodium bicarbonate 1200 mg 3 times a day -- Renal biopsy on 8/7 will follow-up with results   7. Fever, resolved -- Patient febrile to 100.1overnight, currently afebrile -- Normal chest x-ray -- Urinalysis not suspicious for infection -- Blood cultures no growth to date -- CSF no organisms identified, lymphocytic leukocytosis with normal glucose and protein   Dispo: Anticipated discharge in approximately 2-3 day(s).   Ophelia Shoulder, MD 01/08/2016, 1:26 PM Pager: [phone]

## 2016-01-08 NOTE — Consult Note (Signed)
Hospital Consult    Reason for Consult:  In need of permanent HD access Referring Physician:  Dr. Juleen China MRN #:  [id number]  History of Present Illness: This is a 30 y.o. male who presented to the hospital last week with c/o shortness of breath and diarrhea.  He also says that when he looked in the mirror, he was skinny, loosing weight and has always been cut and wanted to check into what was wrong.  He is unsure of how much weight he has lost.  He states he has been having diarrhea and this is continuing.  He states that he woke up on Sunday with a mouth full of dark blood unsure where it came from.  He says it happened one more time, but just not as much.  He does have a hx of HIV, which has not been treated.  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  He is also now on treatment for his HIV/AIDS.   Blood cx on 8/6 are NGTD.  He is continuing to have low grades fevers, which is being worked up.    He continues to smoke, but talks about quitting.  He is currently not using any street drugs, but has hx of IV drug use.   His family/friends are unaware of his HIV/neurosyphilis diagnosis.     Past Medical History:  Diagnosis Date  . Bronchitis   . Hemorrhoids   . HIV (human immunodeficiency virus infection) ([place])     History reviewed. No pertinent surgical history.  No Known Allergies  Prior to Admission medications   Medication Sig Start Date End Date Taking? Authorizing Provider  ibuprofen (ADVIL,MOTRIN) 200 MG tablet Take 400 mg by mouth 2 (two) times daily as needed for headache or moderate pain.   Yes Historical Provider, MD  dolutegravir (TIVICAY) 50 MG tablet Take 1 tablet (50 mg total) by mouth daily. 01/07/16   Michel Bickers, MD  lamiVUDine (EPIVIR) 150 MG tablet Take 1 tablet (150 mg total) by mouth 2 (two) times daily. 01/07/16   Michel Bickers, MD  zidovudine (RETROVIR) 100 MG capsule Take 1 capsule (100 mg total) by mouth  2 (two) times daily. 01/07/16   Michel Bickers, MD    Social History   Social History  . Marital status: Single    Spouse name: N/A  . Number of children: N/A  . Years of education: N/A   Occupational History  . Not on file.   Social History Main Topics  . Smoking status: Current Every Day Smoker    Packs/day: 0.20  . Smokeless tobacco: Never Used  . Alcohol use Yes     Comment: weekly  . Drug use:     Types: Marijuana  . Sexual activity: Not on file   Other Topics Concern  . Not on file   Social History Narrative  . No narrative on file     History reviewed. No pertinent family history. Mother is living  ROS: [x]  Positive   [ ]  Negative   [ ]  All sytems reviewed and are negative  Cardiovascular: []  chest pain/pressure []  palpitations [x]  SOB lying flat [x DOE []  pain in legs while walking []  pain in legs at rest []  pain in legs at night []  non-healing ulcers []  hx of DVT []  swelling in legs   Pulmonary: [x]  productive cough [x]  wheezing []  home O2  Neurologic: []  weakness in []  arms []  legs []  numbness in []   arms []  legs []  hx of CVA []  mini stroke [] difficulty speaking or slurred speech []  temporary loss of vision in one eye []  dizziness [x]  headaches  Hematologic: []  hx of cancer []  bleeding problems []  problems with blood clotting easily  Infectious Diseasae [x]  newly treated HIV/AIDS [x]  neurosyphyllis   Endocrine:   []  diabetes []  thyroid disease  GI []  vomiting blood []  blood in stool [x]  diarrhea  GU: [x]  CKD/renal failure []  HD--[]  M/W/F or []  T/T/S []  burning with urination []  blood in urine  Psychiatric: []  anxiety [x]  depression  Musculoskeletal: []  arthritis []  joint pain  Integumentary: []  rashes []  ulcers  Constitutional: [x]  fever []  chills [x]  weight loss [x]  fatigue   Physical Examination  Vitals:   01/07/16 2043 01/08/16 0627  BP: (!) 148/83 121/72  Pulse: 98 87  Resp: 18 18  Temp: (!) 100.9  F (38.3 C) 99 F (37.2 C)   Body mass index is 23.11 kg/m.  General:  WDWN in NAD Gait: Not observed HENT: WNL, normocephalic Pulmonary: normal non-labored breathing, without Rales, rhonchi,  wheezing Cardiac: regular, without  Murmurs, rubs or gallops; without carotid bruits Abdomen:  soft, NT/ND, no masses Skin: without rashes Vascular Exam/Pulses:  Right Left  Radial 2+ (normal) 2+ (normal)  Brachial 2+ (normal) trace  Ulnar 1+ (weak) 1+ (weak)  DP 2+ (normal) 2+ (normal)  PT 2+ (normal) Unable to palpate    Extremities: without ischemic changes, without Gangrene , without cellulitis; without open wounds;  Musculoskeletal: no muscle wasting or atrophy  Neurologic: A&O X 3; moving all extremities equally. Speech is fluent/normal Psychiatric:  Flat affect Lymph: no palpable cervical or inguinal LAD   CBC    Component Value Date/Time   WBC 6.7 01/08/2016 0330   RBC 2.91 (L) 01/08/2016 0330   HGB 8.4 (L) 01/08/2016 0330   HCT 25.6 (L) 01/08/2016 0330   PLT 239 01/08/2016 0330   MCV 88.0 01/08/2016 0330   MCH 28.9 01/08/2016 0330   MCHC 32.8 01/08/2016 0330   RDW 14.7 01/08/2016 0330   LYMPHSABS 1.9 01/05/2016 0327   MONOABS 0.9 01/05/2016 0327   EOSABS 0.4 01/05/2016 0327   BASOSABS 0.0 01/05/2016 0327    BMET    Component Value Date/Time   NA 137 01/08/2016 0330   K 4.3 01/08/2016 0330   CL 114 (H) 01/08/2016 0330   CO2 17 (L) 01/08/2016 0330   GLUCOSE 93 01/08/2016 0330   BUN 71 (H) 01/08/2016 0330   CREATININE 8.17 (H) 01/08/2016 0330   CALCIUM 7.1 (L) 01/08/2016 0330   GFRNONAA 8 (L) 01/08/2016 0330   GFRAA 9 (L) 01/08/2016 0330    COAGS: Lab Results  Component Value Date   INR 1.11 01/04/2016     Non-Invasive Vascular Imaging:   Upper Extremity Vein Mapping 07/04/38: Right Cephalic Segment Diameter Depth Comment  1. Axilla mm mm   2. Mid upper arm 1.8mm mm   3. Above [facility] 1.58mm mm   4. In [facility] 1.32mm mm   5. Below AC mm mm   6. Mid  forearm mm mm   7. Wrist mm mm    Right Basilic Segment Diameter Depth Comment  1. Axilla mm mm   2. Mid upper arm 5.20mm 7.74mm   3. Above AC 5.11mm 6.27mm   4. In [facility] 3.75mm 4.1mm Multiple branches  5. Below AC mm mm   6. Mid forearm mm mm   7. Wrist mm mm    Left Cephalic too  small to visualize  Left Basilic Segment Diameter Depth Comment  1. Axilla mm mm   2. Mid upper arm 4.12mm 7.50mm   3. Above [facility] 4.48mm 4.61mm   4. In [facility] 3.47mm mm Multiple branches  5. Below AC mm mm   6. Mid forearm mm mm   7. Wrist mm mm      Statin:  No. Beta Blocker:  No. Aspirin:  No. ACEI:  No. ARB:  No. Other antiplatelets/anticoagulants:  No.    ASSESSMENT/PLAN: This is a 30 y.o. male with AKI in setting of untreated HIV/AIDS in need of permanent HD access. Pt is right hand dominant.  FAMILY & FRIENDS ARE NOT AWARE OF HIS CURRENT DIAGNOSIS OF HIV/NEUROSYPHYLIS.    -Pt is right hand dominant.  His cephalic veins on vein mapping are not adequate.  His left basilic appears to be adequate and will most likely require a 1st stage basilic vein transposition.   -will d/w Dr. Bridgett Larsson and he will be by to see the pt later this afternoon. -pt has not been letting blood draws get done from left arm.  Will restrict left arm.   Leontine Locket, PA-C [facility] [phone]  Addendum  I have independently interviewed and examined the patient, and I agree with the physician assistant's findings.  Will need a staged L BVT given size of the basilic vein.   Risk, benefits, and alternatives to access surgery were discussed.    The patient is aware the risks include but are not limited to: bleeding, infection, steal syndrome, nerve damage, ischemic monomelic neuropathy, failure to mature, need for additional procedures, death and stroke.    The patient has not agreed to proceed with the recommended procedure.  Will schedule him tenatively for Wednesday, 16 AUG 17, to  give him time to consider proceeding.  Adele Barthel, MD [facility] [place] Office: [phone] Pager: [phone]  01/08/2016, 2:32 PM

## 2016-01-08 NOTE — Progress Notes (Signed)
Subjective:  Much more alert when I examined him this morning he was try to be as secretive is possible since his mother was outside the room when we had a discussion so I kept a very quiet and brief with regards to his HIV  Antibiotics:  Anti-infectives    Start     Dose/Rate Route Frequency Ordered Stop   01/08/16 1000  lamiVUDine (EPIVIR) 10 MG/ML solution 50 mg     50 mg Oral Daily 01/07/16 1636     01/07/16 1700  dolutegravir (TIVICAY) tablet 50 mg     50 mg Oral Daily 01/07/16 1636     01/07/16 1700  zidovudine (RETROVIR) capsule 300 mg     300 mg Oral Daily 01/07/16 1636     01/07/16 1700  lamiVUDine (EPIVIR) tablet 150 mg     150 mg Oral Once 01/07/16 1636 01/07/16 1831   01/07/16 0915  penicillin G potassium 4 Million Units in dextrose 5 % 250 mL IVPB     4 Million Units 250 mL/hr over 60 Minutes Intravenous Every 8 hours 01/07/16 0855        Medications: Scheduled Meds: . dolutegravir  50 mg Oral Daily  . feeding supplement (ENSURE ENLIVE)  237 mL Oral Q24H  . lamiVUDine  50 mg Oral Daily  . multivitamin with minerals  1 tablet Oral Daily  . pencillin G potassium IV  4 Million Units Intravenous Q8H  . sodium bicarbonate  1,300 mg Oral TID  . zidovudine  300 mg Oral Daily   Continuous Infusions:  PRN Meds:.acetaminophen, loperamide, phenol    Objective: Weight change: 1 lb 3.2 oz (0.544 kg)  Intake/Output Summary (Last 24 hours) at 01/08/16 1545 Last data filed at 01/08/16 1404  Gross per 24 hour  Intake             2140 ml  Output              650 ml  Net             1490 ml   Blood pressure 134/84, pulse 91, temperature 100 F (37.8 C), temperature source Oral, resp. rate 20, height 6' (1.829 m), weight 170 lb 6.4 oz (77.3 kg), SpO2 98 %. Temp:  [99 F (37.2 C)-100.9 F (38.3 C)] 100 F (37.8 C) (08/10 1217) Pulse Rate:  [87-98] 91 (08/10 1217) Resp:  [18-20] 20 (08/10 1217) BP: (121-148)/(72-84) 134/84 (08/10 1217) SpO2:  [98 %-100 %]  98 % (08/10 1217) Weight:  [170 lb 6.4 oz (77.3 kg)] 170 lb 6.4 oz (77.3 kg) (08/10 3474)  Physical Exam: General: Alert and awake, oriented x 3 CVS regular rate, normal r,  no murmur rubs or gallops Chest: clear to auscultation bilaterally, no wheezing, rales or rhonchi Abdomen: soft nontender, nondistended, normal bowel sounds, Extremities: no  clubbing or edema noted bilaterally Skin: no rashes Neuro: nonfocal  CBC: CBC Latest Ref Rng & Units 01/08/2016 01/05/2016 01/04/2016  WBC 4.0 - 10.5 K/uL 6.7 7.0 8.5  Hemoglobin 13.0 - 17.0 g/dL 8.4(L) 9.2(L) 9.0(L)  Hematocrit 39.0 - 52.0 % 25.6(L) 27.8(L) 27.3(L)  Platelets 150 - 400 K/uL 239 262 249      BMET  Recent Labs  01/07/16 0501 01/08/16 0330  NA 138 137  K 4.4 4.3  CL 113* 114*  CO2 16* 17*  GLUCOSE 91 93  BUN 68* 71*  CREATININE 7.88* 8.17*  CALCIUM 7.2* 7.1*     Liver Panel  Recent Labs  01/07/16 0501 01/08/16 0330  ALBUMIN <1.0* <1.0*       Sedimentation Rate No results for input(s): ESRSEDRATE in the last 72 hours. C-Reactive Protein No results for input(s): CRP in the last 72 hours.  Micro Results: Recent Results (from the past 720 hour(s))  Rapid strep screen     Status: None   Collection Time: 01/03/16  1:31 PM  Result Value Ref Range Status   Streptococcus, Group A Screen (Direct) NEGATIVE NEGATIVE Final    Comment: (NOTE) A Rapid Antigen test may result negative if the antigen level in the sample is below the detection level of this test. The FDA has not cleared this test as a stand-alone test therefore the rapid antigen negative result has reflexed to a Group A Strep culture.   Culture, group A strep     Status: None   Collection Time: 01/03/16  1:31 PM  Result Value Ref Range Status   Specimen Description THROAT  Final   Special Requests NONE Reflexed from [id number]  Final   Culture FEW STREPTOCOCCUS,BETA HEMOLYTIC NOT GROUP A  Final   Report Status 01/05/2016 FINAL  Final  Culture,  blood (Routine X 2) w Reflex to ID Panel     Status: None (Preliminary result)   Collection Time: 01/04/16 10:38 AM  Result Value Ref Range Status   Specimen Description BLOOD LEFT ANTECUBITAL  Final   Special Requests BOTTLES DRAWN AEROBIC ONLY 5CC  Final   Culture NO GROWTH 3 DAYS  Final   Report Status PENDING  Incomplete  Culture, blood (Routine X 2) w Reflex to ID Panel     Status: None (Preliminary result)   Collection Time: 01/04/16 10:38 AM  Result Value Ref Range Status   Specimen Description BLOOD RIGHT ANTECUBITAL  Final   Special Requests BOTTLES DRAWN AEROBIC AND ANAEROBIC 6CC  Final   Culture NO GROWTH 3 DAYS  Final   Report Status PENDING  Incomplete  OVA + PARASITE EXAM     Status: None   Collection Time: 01/04/16  2:33 PM  Result Value Ref Range Status   OVA + PARASITE EXAM Final report  Final    Comment: (NOTE) These results were obtained using wet preparation(s) and trichrome stained smear. This test does not include testing for Cryptosporidium parvum, Cyclospora, or Microsporidia. Performed At: [facility] [place], [place] [id number] Lindon Romp MD [id number]:[id number]    Source of Sample STOOL  Final  C difficile quick scan w PCR reflex     Status: None   Collection Time: 01/04/16  3:18 PM  Result Value Ref Range Status   C Diff antigen NEGATIVE NEGATIVE Final   C Diff toxin NEGATIVE NEGATIVE Final   C Diff interpretation No C. difficile detected.  Final  Acid Fast Smear (AFB)     Status: None   Collection Time: 01/05/16  2:09 PM  Result Value Ref Range Status   AFB Specimen Processing Concentration  Final    Comment: (NOTE) Performed At: [facility] [place], [place] [id number] Lindon Romp MD JI:[id number]    Acid Fast Smear NOSMR  Final    Comment: (NOTE) The acid-fast bacilli (AFB) smear will not be performed due to the specimen source (blood) or submission of an insufficient quantity of  specimen.    Source (AFB) BLD  Final  Anaerobic culture     Status: None (Preliminary result)   Collection Time: 01/06/16  5:30 PM  Result Value Ref Range Status   Specimen Description CSF  Final   Special Requests NONE  Final   Culture   Final    NO ANAEROBES ISOLATED; CULTURE IN PROGRESS FOR 5 DAYS   Report Status PENDING  Incomplete  CSF culture     Status: None (Preliminary result)   Collection Time: 01/06/16  5:30 PM  Result Value Ref Range Status   Specimen Description CSF  Final   Special Requests NONE  Final   Gram Stain   Final    WBC PRESENT, PREDOMINANTLY MONONUCLEAR NO ORGANISMS SEEN CYTOSPIN    Culture NO GROWTH 2 DAYS  Final   Report Status PENDING  Incomplete  Fungus Culture With Stain (Not @ [facility])     Status: None (Preliminary result)   Collection Time: 01/06/16  5:30 PM  Result Value Ref Range Status   Fungus Stain Final report  Final    Comment: (NOTE) Performed At: [facility] [place], [place] [id number] Lindon Romp MD [id number]    Fungus (Mycology) Culture PENDING  Incomplete   Fungal Source CSF  Final  Fungus Culture Result     Status: None   Collection Time: 01/06/16  5:30 PM  Result Value Ref Range Status   Result 1 Comment  Final    Comment: (NOTE) KOH/Calcofluor preparation:  no fungus observed. Performed At: [facility] [place], [place] [id number] Lindon Romp MD IZ:[id number]     Studies/Results: Delsa Grana Of Needle/cath Tip For Spinal Inject Rt  Result Date: 01/06/2016 CLINICAL DATA:  AIDS (acquired immune deficiency syndrome) (HCC) B20 (ICD-10-CM) EXAM: DIAGNOSTIC LUMBAR PUNCTURE UNDER FLUOROSCOPIC GUIDANCE FLUOROSCOPY TIME:  Fluoroscopy Time (in minutes and seconds): 0 minutes and 6 seconds Number of Acquired Images:  1 PROCEDURE: Informed consent was obtained from the patient prior to the procedure, including potential complications of headache, allergy, and pain.  With the patient prone, the lower back was prepped with Betadine. 1% Lidocaine was used for local anesthesia. Lumbar puncture was performed at the L3-L4 level using a 22 gauge needle with return of clear CSF with an opening pressure of 7 cm water. 10 ml of CSF were obtained for laboratory studies. The patient tolerated the procedure well and there were no apparent complications. IMPRESSION: Successful fluoroscopically guided lumbar puncture with acquisition of 10 mL of clear CSF. Electronically Signed   By: Lajean Manes M.D.   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)   Depression   Headache   Neurosyphilis    Aaiden Depoy is a 30 y.o. male with untreated HIV disease,AIDS ( Lab Results  Component Value Date   CD4TABS 80 (L) 01/03/2016    with weight loss, fevers and now concern for HIVAN  Also with Neurosyphilis  #1  HIV AIDS with HIVAN: Started TIVICAY plus AZT and Epivir. These meds should bring down viral load rapidly. If he is HLAB701 negative  we can exchange the AZT for Abacavir  Emergency ADAP still has turn around of 48 hours or so so I am skeptical it will already be approved. He may need help from [facility] potentially to bridge him. I will ask Sharyn Lull to check on status of his emergency ADAP  I agree with starting dapsone for PCP prophylaxis  Check G6PD  I have offered to help him disclosed his status to his mother but he does not yet want to do this. It sounds like he does  not have a clear and stable place to go when he leaves the hospital and was protected potentially if he goes to stay with his mother.   #2 Neurosyphilis: Started penicillin and he will need 2 weeks intravenously with IV penicillin   #3 Depression: needs support. I wish we had our Peer Support volunteer active now but he has resigned and we are brining a new one online. Perhaps I can bring a bridge counselor to meet with him (he will need emergency ADAP regardless) I will reach out to [facility]     LOS: 5 days   Alcide Evener 01/08/2016, 3:45 PM

## 2016-01-09 ENCOUNTER — Encounter (HOSPITAL_COMMUNITY): Payer: Self-pay | Admitting: Interventional Radiology

## 2016-01-09 ENCOUNTER — Inpatient Hospital Stay (HOSPITAL_COMMUNITY): Payer: Medicaid Other

## 2016-01-09 HISTORY — PX: IR GENERIC HISTORICAL: IMG1180011

## 2016-01-09 LAB — CULTURE, BLOOD (ROUTINE X 2)
CULTURE: NO GROWTH
Culture: NO GROWTH

## 2016-01-09 LAB — GLUCOSE 6 PHOSPHATE DEHYDROGENASE
G6PDH: 7.8 U/g{Hb} (ref 4.6–13.5)
Hemoglobin: 8.3 g/dL — ABNORMAL LOW (ref 12.6–17.7)

## 2016-01-09 LAB — FLUORESCENT TREPONEMAL AB(FTA)-IGG-BLD: FLUORESCENT TREPONEMAL AB, IGG: REACTIVE — AB

## 2016-01-09 MED ORDER — LIDOCAINE HCL 1 % IJ SOLN
INTRAMUSCULAR | Status: DC | PRN
  Administered 2016-01-09: 10 mL

## 2016-01-09 MED ORDER — LIDOCAINE HCL 1 % IJ SOLN
INTRAMUSCULAR | Status: AC
  Filled 2016-01-09: qty 20

## 2016-01-09 MED ORDER — DAPSONE 100 MG PO TABS
100.0000 mg | ORAL_TABLET | Freq: Every day | ORAL | Status: DC
  Administered 2016-01-09 – 2016-01-13 (×4): 100 mg via ORAL
  Filled 2016-01-09 (×5): qty 1

## 2016-01-09 NOTE — Progress Notes (Signed)
Referring Physician(s): Dr Flonnie Hailstone  Supervising Physician: Marybelle Killings  Patient Status:  Inpatient  Chief Complaint:  HIV/Neurosyphyllis  Subjective:  Need for home antibiotics for several weeks Plan for discharge home soon  Allergies: Review of patient's allergies indicates no known allergies.  Medications: Prior to Admission medications   Medication Sig Start Date End Date Taking? Authorizing Provider  ibuprofen (ADVIL,MOTRIN) 200 MG tablet Take 400 mg by mouth 2 (two) times daily as needed for headache or moderate pain.   Yes Historical Provider, MD  dolutegravir (TIVICAY) 50 MG tablet Take 1 tablet (50 mg total) by mouth daily. 01/08/16   Truman Hayward, MD  lamiVUDine (EPIVIR) 150 MG tablet Take 1 tablet (150 mg total) by mouth 2 (two) times daily. 01/08/16   Truman Hayward, MD  zidovudine (RETROVIR) 100 MG capsule Take 1 capsule (100 mg total) by mouth 2 (two) times daily. 01/08/16   Truman Hayward, MD     Vital Signs: BP 132/89 (BP Location: Right Arm)   Pulse 86   Temp 98.8 F (37.1 C) (Oral)   Resp 20   Ht 6' (1.829 m) Comment: from previous admission  Wt 171 lb (77.6 kg) Comment: scale c  SpO2 97%   BMI 23.19 kg/m   Physical Exam  Constitutional: He is oriented to person, place, and time.  Cardiovascular: Normal heart sounds.   Pulmonary/Chest: Effort normal and breath sounds normal.  Musculoskeletal: Normal range of motion.  Neurological: He is alert and oriented to person, place, and time.  Skin: Skin is warm and dry.  Psychiatric: He has a normal mood and affect. His behavior is normal. Judgment and thought content normal.    Imaging: Ct Head Wo Contrast  Result Date: 01/05/2016 CLINICAL DATA:  Headaches EXAM: CT HEAD WITHOUT CONTRAST TECHNIQUE: Contiguous axial images were obtained from the base of the skull through the vertex without intravenous contrast. COMPARISON:  11/11/2009 FINDINGS: Bony calvarium is intact. No  significant ventricular dilatation is seen. No findings to suggest acute hemorrhage, acute infarction or space-occupying mass lesion are seen. IMPRESSION: No acute intracranial abnormality noted. Electronically Signed   By: Inez Catalina M.D.   On: 01/05/2016 13:26   US Biopsy  Result Date: 01/05/2016 CLINICAL DATA:  Chronic renal insufficiency EXAM: ULTRASOUND GUIDED RENAL CORE BIOPSY COMPARISON:  CT 11/16/2013 TECHNIQUE: The procedure, risks (including but not limited to bleeding, infection, organ damage ), benefits, and alternatives were explained to the patient. Questions regarding the procedure were encouraged and answered. The patient understands and consents to the procedure. Survey ultrasound was performed and an appropriate skin entry site was localized. Site was marked, prepped with Betadine, draped in usual sterile fashion, infiltrated locally with 1% lidocaine. Intravenous Fentanyl and Versed were administered as conscious sedation during continuous cardiorespiratory monitoring by the radiology RN with a total moderate sedation time of 10 minutes. Under real time ultrasound guidance, a 15 gauge trocar needle was advanced to the margin of the lower pole of the right kidney for coaxial core biopsy using a 16 gauge needle for 2 passes. The core samples were submitted to pathology. The patient tolerated procedure well. COMPLICATIONS: COMPLICATIONS None. IMPRESSION: 1. Technically successful ultrasound-guided core renal biopsy , lower pole right kidney Electronically Signed   By: Lucrezia Europe M.D.   On: 01/05/2016 16:42   Dg Fluoro Guided Loc Of Needle/cath Tip For Spinal Inject Rt  Result Date: 01/06/2016 CLINICAL DATA:  AIDS (acquired immune deficiency syndrome) ([place])  B20 (ICD-10-CM) EXAM: DIAGNOSTIC LUMBAR PUNCTURE UNDER FLUOROSCOPIC GUIDANCE FLUOROSCOPY TIME:  Fluoroscopy Time (in minutes and seconds): 0 minutes and 6 seconds Number of Acquired Images:  1 PROCEDURE: Informed consent was obtained from  the patient prior to the procedure, including potential complications of headache, allergy, and pain. With the patient prone, the lower back was prepped with Betadine. 1% Lidocaine was used for local anesthesia. Lumbar puncture was performed at the L3-L4 level using a 22 gauge needle with return of clear CSF with an opening pressure of 7 cm water. 10 ml of CSF were obtained for laboratory studies. The patient tolerated the procedure well and there were no apparent complications. IMPRESSION: Successful fluoroscopically guided lumbar puncture with acquisition of 10 mL of clear CSF. Electronically Signed   By: Lajean Manes M.D.   On: 01/06/2016 18:29    Labs:  CBC:  Recent Labs  01/04/16 1038 01/04/16 1803 01/05/16 0327 01/08/16 0330  WBC 8.5 8.5 7.0 6.7  HGB 10.2* 9.0* 9.2* 8.4*  HCT 30.5* 27.3* 27.8* 25.6*  PLT 285 249 262 239    COAGS:  Recent Labs  01/04/16 1405  INR 1.11  APTT 48*    BMP:  Recent Labs  01/05/16 0328 01/05/16 0758 01/07/16 0501 01/08/16 0330  NA 139 139 138 137  K 4.1 4.6 4.4 4.3  CL 118* 119* 113* 114*  CO2 16* 16* 16* 17*  GLUCOSE 104* 91 91 93  BUN 55* 57* 68* 71*  CALCIUM 7.1* 7.2* 7.2* 7.1*  CREATININE 7.96* 8.09* 7.88* 8.17*  GFRNONAA 8* 8* 8* 8*  GFRAA 10* 9* 10* 9*    LIVER FUNCTION TESTS:  Recent Labs  03/25/15 1119 01/03/16 1046  01/04/16 0505 01/04/16 1038 01/05/16 0328 01/07/16 0501 01/08/16 0330  BILITOT 1.3* 0.4  --  0.4  --   --   --   --   AST 30 26  --  21  --   --   --   --   ALT 22 19  --  17  --   --   --   --   ALKPHOS 81 129*  --  112  --   --   --   --   PROT 8.1 5.7*  --  5.2*  --   --   --   --   ALBUMIN 3.0* <1.0*  < > <1.0* <1.0* <1.0* <1.0* <1.0*  < > = values in this interval not displayed.  Assessment and Plan:  At home antibiotics for HIV/neurosyphyllis treatment Acute kidney injury; Cr 8.17 Scheduled for tunneled central catheter placement Risks and Benefits discussed with the patient including,  but not limited to bleeding, infection, vascular injury, pneumothorax which may require chest tube placement, air embolism or even death All of the patient's questions were answered, patient is agreeable to proceed. Consent signed and in chart.   Electronically Signed: Kyrus Hyde A 01/09/2016, 8:44 AM   I spent a total of 15 Minutes at the the patient's bedside AND on the patient's hospital floor or unit, greater than 50% of which was counseling/coordinating care for tunneled central catheter placement

## 2016-01-09 NOTE — Progress Notes (Signed)
Patient transferred to [phone]. Report call to [facility].  Lemond Griffee, Tivis Ringer, RN

## 2016-01-09 NOTE — Progress Notes (Signed)
Subjective: No acute events overnight. Patient had tunneled IJ double-lumen catheter placed this morning by interventional radiology. This procedure was without complication. Patient is no longer NPO and is able to eat.  Objective:  Vital signs in last 24 hours: Vitals:   01/08/16 0627 01/08/16 1217 01/08/16 2117 01/09/16 0440  BP: 121/72 134/84 (!) 155/97 132/89  Pulse: 87 91 90 86  Resp: 18 20 20 20   Temp: 99 F (37.2 C) 100 F (37.8 C) 98.7 F (37.1 C) 98.8 F (37.1 C)  TempSrc: Oral Oral Oral Oral  SpO2: 99% 98% 100% 97%  Weight: 170 lb 6.4 oz (77.3 kg)   171 lb (77.6 kg)  Height:       Physical Exam  Constitutional: He is oriented to person, place, and time. He appears well-developed and well-nourished.  In no acute distress  HENT:  Head: Normocephalic and atraumatic.  Right tunneled IJ catheter in place  Cardiovascular: Regular rhythm.  Exam reveals no gallop and no friction rub.   No murmur heard. Tachycardic  Respiratory: Effort normal. No respiratory distress. He has wheezes.  Bilateral wheezes in all lung fields  GI: Soft. Bowel sounds are normal. He exhibits no distension. There is no tenderness.  No abdominal bruits auscultated   Musculoskeletal: He exhibits no edema.  Neurological: He is alert and oriented to person, place, and time.  Skin: Skin is warm and dry.  Psychiatric:  Depressed and guarded     Assessment/Plan: Mr. Rockwell is a 30 year old male with AIDS (CD4 count 80) not on ART ( started ART on 01/07/2016) therapywho presents with a 4-5 week history of increased fatigue, cough and diarrhea found to have renal failure and neurosyphilis based on lumbar puncture.  In brief, patient was started on antiretroviral therapy and IV penicillin. The patient had a placement of a right internal jugular tunneled catheter by interventional radiology this morning. We'll continue to wait and follow-up with results of the renal pathology. Additionally, the patient  was started on dapsone 100 mg once daily for PJP pneumonia prophylaxis.  Assessment & Plan by Problem: 1. Renal Failure -- The differential diagnosis for the patient's renal failure includes HIV-associated nephropathy, focal segmental glomerulosclerosis or other glomerular process. -- FeNagreater than 2% indicating intrinsic renal pathology -- Hep B and Hep C antibodies- both negative -- Oral sodium bicarbonate per renal recommendation, will continue 1300 mg 3 times a day -- Follow-up with biopsy result  2. AIDS -- Patient states he was diagnosed with HIV 2-3 years ago and was never taking any antiretroviral therapy. -- He has not followed up with a doctor for this in the past. -- CD4 count 80 -- Patient started on TIVICAY plus AZT and Epivir. -- Pending results of HLA B701 we'll exchange AZT for abacavir -- Started on dapsone 100 mg once daily for PJP prophylaxis -- Cryptococcal antigen (blood) negative -- RPR (non-treponemal) and Fluorescent treponemal (treponemal) positive   3. Neurosyphilis  -- Start IV penicillin for treatment of neurosyphilis, patient will need this for approximately 2 weeks, this will be dosed by pharmacy -- Tunneled IJ catheter placed by interventional radiology on 01/09/2016 -- Continue IV penicillin  4. Diarrhea  -- Patient with 4-5 week history of nonbloody diarrhea. 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 Additionally, opportunistic infections such as Giardia, MAC and CMV. -- ART started -- Acid-fast smear of stool -- C. Difficile negative --  Stool ova and parasites -- Infectious diseases consult, we'll follow the recommendations per their note -- Loperamide 2 mg as needed for diarrhea  5. Chronic Cough -- AIDS (CD4 count 80) not on ARTcomplains of cough of one year duration with greenish sputum production. Given his HIV status  differential diagnosis for his chronic cough is extremely broad but does include atypical organism such as PCP pneumonia. Results of the HIV viral load and CD4 cell count will help differentiating potential infectious etiologies. -- We will walk the patient with O2 saturation today -- Consider starting prophylactic medication based on patient's CD4 count for PJP pneumonia -- Chest x-ray normal -- No leukocytosis most recent white cell count of 8.4 -- Quantiferon tb Gold Assay  6. Non-anion gap metabolic acidosis -- CO2 of 16, sodium 138, chloride 113 -- Differential diagnosis includes renal tubular acidosis and chronic diarrhea -- Renal labs as above -- Oral sodium bicarbonate 1200 mg 3 times a day -- Renal biopsy on 8/7 will follow-up with results   7. Fever, resolved -- Patient febrile to 100.1overnight, currently afebrile -- Normal chest x-ray -- Urinalysis not suspicious for infection -- Blood cultures no growth to date -- CSF no organisms identified, lymphocytic leukocytosis with normal glucose and protein  Dispo: Anticipated discharge in approximately 3-4 day(s).   Ophelia Shoulder, MD 01/09/2016, 12:13 PM Pager: [phone]

## 2016-01-09 NOTE — Progress Notes (Signed)
[facility] Rounding Note Subjective: No new complaints this morning.  He is hungry from being NPO at midnight. Going for tunneled catheter placement this morning. Met with [facility] yesterday to discuss permanent access.  For now, does not want this procedure.  Objective: BP 132/89 (BP Location: Right Arm)   Pulse 86   Temp 98.8 F (37.1 C) (Oral)   Resp 20   Ht 6' (1.829 m) Comment: from previous admission  Wt 171 lb (77.6 kg) Comment: scale c  SpO2 97%   BMI 23.19 kg/m    Intake/Output Summary (Last 24 hours) at 01/09/16 1041 Last data filed at 01/09/16 4656  Gross per 24 hour  Intake              800 ml  Output             1000 ml  Net             -200 ml    Weight change: 9.6 oz (0.272 kg)  EXAM: General: lying on his side in bed, more talkative today. HEENT: poor dentition, EOMI, no scleral icterus Ext: warm and well perfused, no pedal edema Neuro: alert and oriented X3, cranial nerves II-XII grossly intact Psych: reserved, pleasant  Labs: Basic Metabolic Panel:  Recent Labs Lab 01/05/16 0328 01/05/16 0758 01/07/16 0501 01/08/16 0330  NA 139 139 138 137  K 4.1 4.6 4.4 4.3  CL 118* 119* 113* 114*  CO2 16* 16* 16* 17*  GLUCOSE 104* 91 91 93  BUN 55* 57* 68* 71*  CREATININE 7.96* 8.09* 7.88* 8.17*  CALCIUM 7.1* 7.2* 7.2* 7.1*  PHOS 3.9  --  4.3 5.1*    Liver Function Tests:  Recent Labs Lab 01/03/16 1046  01/04/16 0505  01/05/16 0328 01/07/16 0501 01/08/16 0330  AST 26  --  21  --   --   --   --   ALT 19  --  17  --   --   --   --   ALKPHOS 129*  --  112  --   --   --   --   BILITOT 0.4  --  0.4  --   --   --   --   PROT 5.7*  --  5.2*  --   --   --   --   ALBUMIN <1.0*  < > <1.0*  < > <1.0* <1.0* <1.0*  < > = values in this interval not displayed.  Recent Labs Lab 01/03/16 1046  LIPASE 25   CBC:  Recent Labs Lab 01/03/16 1046 01/04/16 1038 01/04/16 1803 01/05/16 0327 01/08/16 0330  WBC 8.5 8.5 8.5 7.0 6.7  NEUTROABS  --  4.3  --  3.8  --    HGB 10.7* 10.2* 9.0* 9.2* 8.4*  HCT 32.2* 30.5* 27.3* 27.8* 25.6*  MCV 88.5 87.6 88.9 88.8 88.0  PLT 336 285 249 262 239    Cardiac Enzymes:  Recent Labs Lab 01/03/16 1549  CKTOTAL 38*    Studies/Results: No results found. Background: 30 y.o. year-old [place] man with PMH of bronchitis and HIV diagnosed in 2014 which has never been treated. 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 In the ED he was found to have a creatinine of 8.09, bicarb of 16, serum  albumin <1, UA with >300 protein, 6-30 RBC's. Renal ultrasound ->12.5, 12.7 cm kidneys, echodense.  He was given a liter of NS in the ED and then admitted for further evaluation. Labs that have returned so far->neg Hep B,C, ESR 140. ANCA, ANA, complements, cryoglobulins are all pending. HIV-1 RNA also pending. We are asked to see.  Assessment/Recommendations 1.  Renal Failure: with proteinuria and hematuria.   Normal renal function in Oct 2016 with creatinine of 1.08.  He presents with creatinine of 8 and proteinuria/microscopic hematuria (UPC could not be calculated d/t protein concentration ">3000") in the setting of untreated HIV.  Not sure how much improvement we could reasonably expect with treatment of his HIV. Likely near or at ESRD.   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 - Vein mapping completed. - [facility] has seen patient to discuss permanent access.  Patient at this point has not agreed to proceed but he is tentatively scheduled for next week to give him time to consider.  2.  Metabolic acidosis in setting of renal failure - on oral sodium bicarb started by primary service.  3.  Anemia: likely due to CKD - Ferritin 839, Iron 46,  Saturation ratio 43%  4. HIV - Since 2014. Untreated. Family is not aware of his diagnosis. - started on ART by ID  5. Diarrhea, weight loss   6.  Fevers - resolved  7.  Depressed mood - could consider starting anti-depressant, but also may just need some time to come to terms with all his new diagnosis'.  He continues to be more interactive this morning.   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  He will need to begin access preparation but is reluctant.  Will continue to educate patient. Johnnie Moten C

## 2016-01-09 NOTE — Progress Notes (Signed)
Lorazepam given prior to MRI, pt is in NPO for IR today and pt is aware about that, pt slept on and off, tylenol one time provided for complain of headache, no any other specific complain.

## 2016-01-09 NOTE — Procedures (Signed)
RIJV tunneled PICC SVC RA No comp/EBL

## 2016-01-09 NOTE — Progress Notes (Signed)
Subjective:  Says he is tolerating his ARV  Antibiotics:  Anti-infectives    Start     Dose/Rate Route Frequency Ordered Stop   01/09/16 1000  dapsone tablet 100 mg     100 mg Oral Daily 01/09/16 0821     01/08/16 1000  lamiVUDine (EPIVIR) 10 MG/ML solution 50 mg     50 mg Oral Daily 01/07/16 1636     01/07/16 1700  dolutegravir (TIVICAY) tablet 50 mg     50 mg Oral Daily 01/07/16 1636     01/07/16 1700  zidovudine (RETROVIR) capsule 300 mg     300 mg Oral Daily 01/07/16 1636     01/07/16 1700  lamiVUDine (EPIVIR) tablet 150 mg     150 mg Oral Once 01/07/16 1636 01/07/16 1831   01/07/16 0915  penicillin G potassium 4 Million Units in dextrose 5 % 250 mL IVPB     4 Million Units 250 mL/hr over 60 Minutes Intravenous Every 8 hours 01/07/16 0855        Medications: Scheduled Meds: . dapsone  100 mg Oral Daily  . dolutegravir  50 mg Oral Daily  . feeding supplement (ENSURE ENLIVE)  237 mL Oral Q24H  . lamiVUDine  50 mg Oral Daily  . lidocaine      . multivitamin with minerals  1 tablet Oral Daily  . pencillin G potassium IV  4 Million Units Intravenous Q8H  . sodium bicarbonate  1,300 mg Oral TID  . zidovudine  300 mg Oral Daily   Continuous Infusions:  PRN Meds:.acetaminophen, lidocaine, loperamide, phenol    Objective: Weight change: 9.6 oz (0.272 kg)  Intake/Output Summary (Last 24 hours) at 01/09/16 1934 Last data filed at 01/09/16 1830  Gross per 24 hour  Intake             1110 ml  Output              750 ml  Net              360 ml   Blood pressure (!) 145/100, pulse 98, temperature 98.5 F (36.9 C), temperature source Oral, resp. rate 18, height 6' (1.829 m), weight 171 lb (77.6 kg), SpO2 96 %. Temp:  [98.5 F (36.9 C)-98.9 F (37.2 C)] 98.5 F (36.9 C) (08/11 1642) Pulse Rate:  [86-98] 98 (08/11 1642) Resp:  [18-20] 18 (08/11 1642) BP: (132-155)/(89-100) 145/100 (08/11 1642) SpO2:  [96 %-100 %] 96 % (08/11 1642) Weight:  [171 lb (77.6  kg)] 171 lb (77.6 kg) (08/11 0440)  Physical Exam: General: Alert and awake, oriented x 3 CVS regular rate, normal r,  no murmur rubs or gallops Chest: clear to auscultation bilaterally, no wheezing, rales or rhonchi Abdomen: soft nontender, nondistended, normal bowel sounds, Extremities: no  clubbing or edema noted bilaterally Skin: no rashes Neuro: nonfocal  CBC: CBC Latest Ref Rng & Units 01/08/2016 01/05/2016 01/04/2016  WBC 4.0 - 10.5 K/uL 6.7 7.0 8.5  Hemoglobin 13.0 - 17.0 g/dL 8.4(L) 9.2(L) 9.0(L)  Hematocrit 39.0 - 52.0 % 25.6(L) 27.8(L) 27.3(L)  Platelets 150 - 400 K/uL 239 262 249      BMET  Recent Labs  01/07/16 0501 01/08/16 0330  NA 138 137  K 4.4 4.3  CL 113* 114*  CO2 16* 17*  GLUCOSE 91 93  BUN 68* 71*  CREATININE 7.88* 8.17*  CALCIUM 7.2* 7.1*     Liver Panel   Recent Labs  01/07/16  0501 01/08/16 0330  ALBUMIN <1.0* <1.0*       Sedimentation Rate No results for input(s): ESRSEDRATE in the last 72 hours. C-Reactive Protein No results for input(s): CRP in the last 72 hours.  Micro Results: Recent Results (from the past 720 hour(s))  Rapid strep screen     Status: None   Collection Time: 01/03/16  1:31 PM  Result Value Ref Range Status   Streptococcus, Group A Screen (Direct) NEGATIVE NEGATIVE Final    Comment: (NOTE) A Rapid Antigen test may result negative if the antigen level in the sample is below the detection level of this test. The FDA has not cleared this test as a stand-alone test therefore the rapid antigen negative result has reflexed to a Group A Strep culture.   Culture, group A strep     Status: None   Collection Time: 01/03/16  1:31 PM  Result Value Ref Range Status   Specimen Description THROAT  Final   Special Requests NONE Reflexed from [id number]  Final   Culture FEW STREPTOCOCCUS,BETA HEMOLYTIC NOT GROUP A  Final   Report Status 01/05/2016 FINAL  Final  Culture, blood (Routine X 2) w Reflex to ID Panel     Status:  None   Collection Time: 01/04/16 10:38 AM  Result Value Ref Range Status   Specimen Description BLOOD LEFT ANTECUBITAL  Final   Special Requests BOTTLES DRAWN AEROBIC ONLY 5CC  Final   Culture NO GROWTH 5 DAYS  Final   Report Status 01/09/2016 FINAL  Final  Culture, blood (Routine X 2) w Reflex to ID Panel     Status: None   Collection Time: 01/04/16 10:38 AM  Result Value Ref Range Status   Specimen Description BLOOD RIGHT ANTECUBITAL  Final   Special Requests BOTTLES DRAWN AEROBIC AND ANAEROBIC 6CC  Final   Culture NO GROWTH 5 DAYS  Final   Report Status 01/09/2016 FINAL  Final  OVA + PARASITE EXAM     Status: None   Collection Time: 01/04/16  2:33 PM  Result Value Ref Range Status   OVA + PARASITE EXAM Final report  Final    Comment: (NOTE) These results were obtained using wet preparation(s) and trichrome stained smear. This test does not include testing for Cryptosporidium parvum, Cyclospora, or Microsporidia. Performed At: [facility] [place], [place] [id number] Lindon Romp MD [id number]    Source of Sample STOOL  Final  Acid Fast Smear (AFB)     Status: None   Collection Time: 01/04/16  2:33 PM  Result Value Ref Range Status   AFB Specimen Processing Concentration  Final   Acid Fast Smear Negative  Final    Comment: (NOTE) Performed At: [facility] [place], [place] [id number] Lindon Romp MD [id number]    Source (AFB) STOOL  Final  C difficile quick scan w PCR reflex     Status: None   Collection Time: 01/04/16  3:18 PM  Result Value Ref Range Status   C Diff antigen NEGATIVE NEGATIVE Final   C Diff toxin NEGATIVE NEGATIVE Final   C Diff interpretation No C. difficile detected.  Final  Acid Fast Smear (AFB)     Status: None   Collection Time: 01/05/16  2:09 PM  Result Value Ref Range Status   AFB Specimen Processing Concentration  Final    Comment: (NOTE) Performed At: [facility] [place], [place] [id number] Lindon Romp MD [id number]  Acid Fast Smear NOSMR  Final    Comment: (NOTE) The acid-fast bacilli (AFB) smear will not be performed due to the specimen source (blood) or submission of an insufficient quantity of specimen.    Source (AFB) BLD  Final  Anaerobic culture     Status: None (Preliminary result)   Collection Time: 01/06/16  5:30 PM  Result Value Ref Range Status   Specimen Description CSF  Final   Special Requests NONE  Final   Culture   Final    NO ANAEROBES ISOLATED; CULTURE IN PROGRESS FOR 5 DAYS   Report Status PENDING  Incomplete  CSF culture     Status: None (Preliminary result)   Collection Time: 01/06/16  5:30 PM  Result Value Ref Range Status   Specimen Description CSF  Final   Special Requests NONE  Final   Gram Stain   Final    WBC PRESENT, PREDOMINANTLY MONONUCLEAR NO ORGANISMS SEEN CYTOSPIN    Culture NO GROWTH 3 DAYS  Final   Report Status PENDING  Incomplete  Fungus Culture With Stain (Not @ [facility])     Status: None (Preliminary result)   Collection Time: 01/06/16  5:30 PM  Result Value Ref Range Status   Fungus Stain Final report  Final    Comment: (NOTE) Performed At: [facility] [place], [place] [id number] Lindon Romp MD [id number]    Fungus (Mycology) Culture PENDING  Incomplete   Fungal Source CSF  Final  Fungus Culture Result     Status: None   Collection Time: 01/06/16  5:30 PM  Result Value Ref Range Status   Result 1 Comment  Final    Comment: (NOTE) KOH/Calcofluor preparation:  no fungus observed. Performed At: [facility] [place], [place] [id number] Lindon Romp MD [id number]     Studies/Results: Ir Fluoro Guide Cv Line Right  Result Date: 01/09/2016 INDICATION: HIV EXAM: TUNNELED RIGHT JUGULAR PICC LINE PLACEMENT WITH ULTRASOUND AND FLUOROSCOPIC GUIDANCE MEDICATIONS: None; The antibiotic was administered within an  appropriate time interval prior to skin puncture. ANESTHESIA/SEDATION: None FLUOROSCOPY TIME:  Fluoroscopy Time:  minutes 12 seconds (1 mGy). COMPLICATIONS: None immediate. PROCEDURE: The patient was advised of the possible risks and complications and agreed to undergo the procedure. The patient was then brought to the angiographic suite for the procedure. The right neck was prepped with chlorhexidine, draped in the usual sterile fashion using maximum barrier technique (cap and mask, sterile gown, sterile gloves, large sterile sheet, hand hygiene and cutaneous antiseptic). Local anesthesia was attained by infiltration with 1% lidocaine. Ultrasound demonstrated patency of the right jugular vein, and this was documented with an image. Under real-time ultrasound guidance, this vein was accessed with a 21 gauge micropuncture needle and image documentation was performed. The needle was exchanged over a guidewire for a peel-away sheath through which a 25 cm 5 [place] double lumen power injectable tunneled PICC was advanced, and positioned with its tip at the lower SVC/right atrial junction. The cuff was positioned in the extended subcutaneous tract. Fluoroscopy during the procedure and fluoro spot radiograph confirms appropriate catheter position. The catheter was flushed, secured to the skin with Prolene sutures, and covered with a sterile dressing. IMPRESSION: Successful placement of a tunneled right jugular PICC with sonographic and fluoroscopic guidance. The catheter is ready for use. Electronically Signed   By: Marybelle Killings M.D.   On: 01/09/2016 16:48   Ir US Guide Vasc Access Right  Result Date: 01/09/2016 INDICATION:  HIV EXAM: TUNNELED RIGHT JUGULAR PICC LINE PLACEMENT WITH ULTRASOUND AND FLUOROSCOPIC GUIDANCE MEDICATIONS: None; The antibiotic was administered within an appropriate time interval prior to skin puncture. ANESTHESIA/SEDATION: None FLUOROSCOPY TIME:  Fluoroscopy Time:  minutes 12 seconds (1 mGy).  COMPLICATIONS: None immediate. PROCEDURE: The patient was advised of the possible risks and complications and agreed to undergo the procedure. The patient was then brought to the angiographic suite for the procedure. The right neck was prepped with chlorhexidine, draped in the usual sterile fashion using maximum barrier technique (cap and mask, sterile gown, sterile gloves, large sterile sheet, hand hygiene and cutaneous antiseptic). Local anesthesia was attained by infiltration with 1% lidocaine. Ultrasound demonstrated patency of the right jugular vein, and this was documented with an image. Under real-time ultrasound guidance, this vein was accessed with a 21 gauge micropuncture needle and image documentation was performed. The needle was exchanged over a guidewire for a peel-away sheath through which a 25 cm 5 [place] double lumen power injectable tunneled PICC was advanced, and positioned with its tip at the lower SVC/right atrial junction. The cuff was positioned in the extended subcutaneous tract. Fluoroscopy during the procedure and fluoro spot radiograph confirms appropriate catheter position. The catheter was flushed, secured to the skin with Prolene sutures, and covered with a sterile dressing. IMPRESSION: Successful placement of a tunneled right jugular PICC with sonographic and fluoroscopic guidance. The catheter is ready for use. Electronically Signed   By: Marybelle Killings M.D.   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 male with untreated HIV disease,AIDS ( Lab Results  Component Value Date   CD4TABS 80 (L) 01/03/2016    with weight loss, fevers and now concern for HIVAN  Also with Neurosyphilis  #1  HIV AIDS with HIVAN: Started TIVICAY plus AZT and Epivir. These meds should bring down viral load rapidly. Since he is HLAB701 negative we can exchange the AZT for Abacavir but I will discuss with him first.  Emergency ADAP still has turn around of 48 hours or so so I am skeptical it will already be approved. He may need help from [place] potentially to bridge him.  I agree with starting dapsone for PCP prophylaxis   I have offered to help him disclosed his status to his mother but he does not yet want to do this. It sounds like he does not have a clear and stable place to go when he leaves the hospital and was protected potentially if he goes to stay with his mother.   #2 Neurosyphilis: Started penicillin and he will need 2 weeks intravenously with IV penicillin   #3 Depression: needs support. I wish we had our Peer Support volunteer active now but he has resigned and we are brining a new one online. Perhaps  I can bring a bridge counselor to meet with him (he will need emergency ADAP regardless) I will reach out to [facility]     LOS: 6 days   Alcide Evener 01/09/2016, 7:34 PM

## 2016-01-10 ENCOUNTER — Inpatient Hospital Stay (HOSPITAL_COMMUNITY): Payer: Medicaid Other

## 2016-01-10 DIAGNOSIS — R05 Cough: Secondary | ICD-10-CM

## 2016-01-10 DIAGNOSIS — R112 Nausea with vomiting, unspecified: Secondary | ICD-10-CM

## 2016-01-10 DIAGNOSIS — R059 Cough, unspecified: Secondary | ICD-10-CM

## 2016-01-10 LAB — CSF CULTURE W GRAM STAIN: Culture: NO GROWTH

## 2016-01-10 LAB — CSF CULTURE

## 2016-01-10 MED ORDER — ONDANSETRON 4 MG PO TBDP
8.0000 mg | ORAL_TABLET | Freq: Three times a day (TID) | ORAL | Status: DC | PRN
  Administered 2016-01-10 – 2016-01-12 (×2): 8 mg via ORAL
  Filled 2016-01-10 (×2): qty 2

## 2016-01-10 NOTE — Care Management (Signed)
CM met with patient at bedside, SO was ask to step out he and patient was agreeable. Discussed discharge planning. Patient confirmed being uninsured and prior to this hospitalization has not been on antivirals.  CM discussed MATCH program and the guidelines patient agreeable to terms. Enrolled, [place] letter printed and placed on shadow chart. CM also discussed ADAP program which covers HIV maintenance meds.  Orland Mustard Midwife, CM will send referral via in-basket regarding having patient set up with ADAP medication assistance. Patient awaiting decision  On AVF placement.

## 2016-01-10 NOTE — Progress Notes (Signed)
Subjective: Interval History: N and vomiting today  Objective: Vital signs in last 24 hours: Temp:  [98.4 F (36.9 C)-98.6 F (37 C)] 98.4 F (36.9 C) (08/12 0455) Pulse Rate:  [82-98] 83 (08/12 0455) Resp:  [18-20] 20 (08/12 0455) BP: (139-145)/(91-100) 139/91 (08/12 0455) SpO2:  [96 %-98 %] 96 % (08/12 0455) Weight:  [79.2 kg (174 lb 8 oz)] 79.2 kg (174 lb 8 oz) (08/11 2128) Weight change: 1.588 kg (3 lb 8 oz)  Intake/Output from previous day: 08/11 0701 - 08/12 0700 In: 2570 [P.O.:1320; IV Piggyback:1250] Out: 115 [Urine:115] Intake/Output this shift: Total I/O In: 240 [P.O.:240] Out: -   PE no change   Lab Results:  Recent Labs  01/08/16 0330  WBC 6.7  HGB 8.4*  HCT 25.6*  PLT 239   BMET:  Recent Labs  01/08/16 0330  NA 137  K 4.3  CL 114*  CO2 17*  GLUCOSE 93  BUN 71*  CREATININE 8.17*  CALCIUM 7.1*   No results for input(s): PTH in the last 72 hours. Iron Studies: No results for input(s): IRON, TIBC, TRANSFERRIN, FERRITIN in the last 72 hours. Studies/Results: Dg Chest 2 View  Result Date: 01/10/2016 CLINICAL DATA:  Patient with cough and wheezing.  Renal failure. EXAM: CHEST  2 VIEW COMPARISON:  Chest radiograph 01/03/2016 FINDINGS: Venous catheter tip projects over the superior cavoatrial junction. Stable cardiac and mediastinal contours. Minimal heterogeneous opacities left lung base. No pleural effusion or pneumothorax. Mid thoracic spine degenerative changes. IMPRESSION: Minimal heterogeneous opacities left lung base may represent atelectasis or infection. Electronically Signed   By: Lovey Newcomer M.D.   On: 01/10/2016 12:10   Ir Fluoro Guide Cv Line Right  Result Date: 01/09/2016 INDICATION: HIV EXAM: TUNNELED RIGHT JUGULAR PICC LINE PLACEMENT WITH ULTRASOUND AND FLUOROSCOPIC GUIDANCE MEDICATIONS: None; The antibiotic was administered within an appropriate time interval prior to skin puncture. ANESTHESIA/SEDATION: None FLUOROSCOPY TIME:   Fluoroscopy Time:  minutes 12 seconds (1 mGy). COMPLICATIONS: None immediate. PROCEDURE: The patient was advised of the possible risks and complications and agreed to undergo the procedure. The patient was then brought to the angiographic suite for the procedure. The right neck was prepped with chlorhexidine, draped in the usual sterile fashion using maximum barrier technique (cap and mask, sterile gown, sterile gloves, large sterile sheet, hand hygiene and cutaneous antiseptic). Local anesthesia was attained by infiltration with 1% lidocaine. Ultrasound demonstrated patency of the right jugular vein, and this was documented with an image. Under real-time ultrasound guidance, this vein was accessed with a 21 gauge micropuncture needle and image documentation was performed. The needle was exchanged over a guidewire for a peel-away sheath through which a 25 cm 5 [place] double lumen power injectable tunneled PICC was advanced, and positioned with its tip at the lower SVC/right atrial junction. The cuff was positioned in the extended subcutaneous tract. Fluoroscopy during the procedure and fluoro spot radiograph confirms appropriate catheter position. The catheter was flushed, secured to the skin with Prolene sutures, and covered with a sterile dressing. IMPRESSION: Successful placement of a tunneled right jugular PICC with sonographic and fluoroscopic guidance. The catheter is ready for use. Electronically Signed   By: Marybelle Killings M.D.   On: 01/09/2016 16:48   Ir US Guide Vasc Access Right  Result Date: 01/09/2016 INDICATION: HIV EXAM: TUNNELED RIGHT JUGULAR PICC LINE PLACEMENT WITH ULTRASOUND AND FLUOROSCOPIC GUIDANCE MEDICATIONS: None; The antibiotic was administered within an appropriate time interval prior to skin puncture. ANESTHESIA/SEDATION: None FLUOROSCOPY TIME:  Fluoroscopy Time:  minutes 12 seconds (1 mGy). COMPLICATIONS: None immediate. PROCEDURE: The patient was advised of the possible risks and  complications and agreed to undergo the procedure. The patient was then brought to the angiographic suite for the procedure. The right neck was prepped with chlorhexidine, draped in the usual sterile fashion using maximum barrier technique (cap and mask, sterile gown, sterile gloves, large sterile sheet, hand hygiene and cutaneous antiseptic). Local anesthesia was attained by infiltration with 1% lidocaine. Ultrasound demonstrated patency of the right jugular vein, and this was documented with an image. Under real-time ultrasound guidance, this vein was accessed with a 21 gauge micropuncture needle and image documentation was performed. The needle was exchanged over a guidewire for a peel-away sheath through which a 25 cm 5 [place] double lumen power injectable tunneled PICC was advanced, and positioned with its tip at the lower SVC/right atrial junction. The cuff was positioned in the extended subcutaneous tract. Fluoroscopy during the procedure and fluoro spot radiograph confirms appropriate catheter position. The catheter was flushed, secured to the skin with Prolene sutures, and covered with a sterile dressing. IMPRESSION: Successful placement of a tunneled right jugular PICC with sonographic and fluoroscopic guidance. The catheter is ready for use. Electronically Signed   By: Marybelle Killings M.D.   On: 01/09/2016 16:48    Assessment/Plan: I had a long discussion again about his multiple treatable illnesses.  I also advised him to speak to his mother about his illnesses... If agreeable we would prefer to keep him in hospital for AVF access placement.    LOS: 7 days   Heran Campau C 01/10/2016,2:33 PM

## 2016-01-10 NOTE — Progress Notes (Signed)
Subjective: No acute events overnight. Having some nausea and vomitting this morning. Had some red colored Gatorade prior to the emesis episode and noticed some red color to the emesis. Still having some loose stool but getting better. Continues to feel tired.   Objective:  Vital signs in last 24 hours: Vitals:   01/09/16 1430 01/09/16 1642 01/09/16 2128 01/10/16 0455  BP: (!) 150/95 (!) 145/100 (!) 145/91 (!) 139/91  Pulse: 89 98 82 83  Resp: 20 18 19 20   Temp: 98.9 F (37.2 C) 98.5 F (36.9 C) 98.6 F (37 C) 98.4 F (36.9 C)  TempSrc: Oral Oral Oral Oral  SpO2: 98% 96% 98% 96%  Weight:   174 lb 8 oz (79.2 kg)   Height:       Physical Exam  Constitutional: He is oriented to person, place, and time. He appears well-developed and well-nourished. No distress.  In no acute distress  HENT:  Head: Normocephalic and atraumatic.  Right tunneled IJ catheter in place  Cardiovascular: Normal rate and regular rhythm.  Exam reveals no gallop and no friction rub.   No murmur heard. Respiratory: Effort normal. No respiratory distress. He has wheezes.  Bilateral wheezes in all lung fields  GI: Soft. Bowel sounds are normal. He exhibits no distension. There is no tenderness.  Musculoskeletal: He exhibits no edema.  Neurological: He is alert and oriented to person, place, and time.  Skin: Skin is warm and dry.  Psychiatric:  Depressed and guarded     Assessment/Plan: Mr. 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  Assessment & Plan by Problem: 1. 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 Continues to have good UOP however his GFR remains <10.  -- already started anti-retrovirals. Very low chance of renal recovery. Vein mapping done. Patient is refusing any  vascular access for now, I have continued the conversation today and gave him more time to think about this. I would not feel safe discharging him without HD access.  -- Oral sodium bicarbonate per renal recommendation, will continue 1300 mg 3 times a day -- appreciate renal assistance.   2. AIDS -- Patient states he was diagnosed with HIV 2-3 years ago and was never taking any antiretroviral therapy. He has not followed up with a doctor for this in the past. CD4 count 80 -- Patient started on TIVICAY plus AZT and Epivir. Tolerating meds well other than some nausea/vomitting.  -- negative HLA B701, could exchange AZT for abacavir - will defer to ID -- Started on dapsone 100 mg once daily for PJP prophylaxis as G6PD was normal.  -- Cryptococcal antigen (blood) negative -- RPR (non-treponemal) and Fluorescent treponemal (treponemal) positive   3. Neurosyphilis  -- cont IV penicillin for treatment of neurosyphilis, patient will need this for approximately 2 weeks, this will be dosed by pharmacy -- Tunneled IJ catheter placed by interventional radiology on 01/09/2016 -- Continue IV penicillin  4. Diarrhea  -- Patient with 4-5 week history of nonbloody diarrhea. 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 Additionally, opportunistic infections such as Giardia, MAC (less likely as CD 4 is not less than 50) and CMV. -- ART started - feels some improvement  of diarrhea with this. -- Acid-fast smear of stool negative, culture pending.  -- C. Difficile negative -- Stool ova and parasites negative.  -- Infectious diseases consultant recs appreciated.  -- Loperamide 2 mg as needed for diarrhea  5. Chronic Cough -- AIDS (CD4 count 80) not on ARTcomplains of cough of one year duration with greenish sputum production. Given his HIV status differential diagnosis for his chronic cough is extremely  broad but does include atypical organism such as PCP pneumonia.  -- will do ppx for PCP for now. If becomes hypoxic will consider doing PCP treamtment.  - will repeat CXR today.   6. Non-anion gap metabolic acidosis  -- Differential diagnosis includes renal tubular acidosis and chronic diarrhea -- cont Oral sodium bicarbonate 1200 mg 3 times a day  7. Fever, resolved - likely 2/2 to AIDS Was febrile but currently afebrile for 48 hours.  -- Normal chest x-ray on admission, but will repeat as above as concern for PCP pna -- Urinalysis not suspicious for infection -- Blood cultures no growth to date  Dispo: Anticipated discharge in approximately 3-4 day(s).   Dellia Nims, MD 01/10/2016, 10:59 AM Pager: [phone]

## 2016-01-11 DIAGNOSIS — K219 Gastro-esophageal reflux disease without esophagitis: Secondary | ICD-10-CM

## 2016-01-11 LAB — RENAL FUNCTION PANEL
ANION GAP: 11 (ref 5–15)
BUN: 74 mg/dL — AB (ref 6–20)
CALCIUM: 7.5 mg/dL — AB (ref 8.9–10.3)
CO2: 18 mmol/L — AB (ref 22–32)
Chloride: 111 mmol/L (ref 101–111)
Creatinine, Ser: 9.25 mg/dL — ABNORMAL HIGH (ref 0.61–1.24)
GFR calc Af Amer: 8 mL/min — ABNORMAL LOW (ref >60)
GFR calc non Af Amer: 7 mL/min — ABNORMAL LOW (ref >60)
GLUCOSE: 96 mg/dL (ref 65–99)
Phosphorus: 4.3 mg/dL (ref 2.5–4.6)
Potassium: 4.8 mmol/L (ref 3.5–5.1)
SODIUM: 140 mmol/L (ref 135–145)

## 2016-01-11 LAB — CBC
HEMATOCRIT: 25.4 % — AB (ref 39.0–52.0)
HEMOGLOBIN: 8.2 g/dL — AB (ref 13.0–17.0)
MCH: 28.7 pg (ref 26.0–34.0)
MCHC: 32.3 g/dL (ref 30.0–36.0)
MCV: 88.8 fL (ref 78.0–100.0)
Platelets: 268 10*3/uL (ref 150–400)
RBC: 2.86 MIL/uL — ABNORMAL LOW (ref 4.22–5.81)
RDW: 15.2 % (ref 11.5–15.5)
WBC: 7.6 10*3/uL (ref 4.0–10.5)

## 2016-01-11 MED ORDER — DEXTROSE 5 % IV SOLN
1.5000 g | INTRAVENOUS | Status: AC
  Administered 2016-01-12: 1.5 g via INTRAVENOUS
  Filled 2016-01-11: qty 1.5

## 2016-01-11 NOTE — Progress Notes (Signed)
[facility] Rounding Note Subjective: No new complaints this morning, nausea improved. He is agreeable to AVF dialysis access placement during this admission. He is concerned about the way the AVF may look after looking at pictures. He tells me he has discussed his renal issues with his family, but still has not spoken to them about his HIV status.  Objective: BP 127/79 (BP Location: Right Arm)   Pulse 78   Temp 97.3 F (36.3 C) (Oral)   Resp 16   Ht 6' (1.829 m) Comment: from previous admission  Wt 174 lb 6.9 oz (79.1 kg)   SpO2 97%   BMI 23.66 kg/m    Intake/Output Summary (Last 24 hours) at 01/11/16 1308 Last data filed at 01/11/16 0945  Gross per 24 hour  Intake             2400 ml  Output              375 ml  Net             2025 ml    Weight change: -1.2 oz (-0.033 kg)  EXAM: General: lying on his side in bed, more talkative today. Cardiac: RRR Lungs: CTA Ext: warm and well perfused, no pedal edema Neuro: alert and oriented X3 Psych: reserved, pleasant  Labs: Basic Metabolic Panel:  Recent Labs Lab 01/07/16 0501 01/08/16 0330 01/11/16 0516  NA 138 137 140  K 4.4 4.3 4.8  CL 113* 114* 111  CO2 16* 17* 18*  GLUCOSE 91 93 96  BUN 68* 71* 74*  CREATININE 7.88* 8.17* 9.25*  CALCIUM 7.2* 7.1* 7.5*  PHOS 4.3 5.1* 4.3    Liver Function Tests:  Recent Labs Lab 01/07/16 0501 01/08/16 0330 01/11/16 0516  ALBUMIN <1.0* <1.0* <1.0*   No results for input(s): LIPASE, AMYLASE in the last 168 hours. CBC:  Recent Labs Lab 01/04/16 1803 01/05/16 0327 01/08/16 0330 01/11/16 0516  WBC 8.5 7.0 6.7 7.6  NEUTROABS  --  3.8  --   --   HGB 9.0* 9.2* 8.4* 8.2*  HCT 27.3* 27.8* 25.6* 25.4*  MCV 88.9 88.8 88.0 88.8  PLT 249 262 239 268    Cardiac Enzymes: No results for input(s): CKTOTAL, CKMB, CKMBINDEX, TROPONINI in the last 168 hours.  Studies/Results: Dg Chest 2 View  Result Date: 01/10/2016 CLINICAL DATA:  Patient with cough and wheezing.  Renal  failure. EXAM: CHEST  2 VIEW COMPARISON:  Chest radiograph 01/03/2016 FINDINGS: Venous catheter tip projects over the superior cavoatrial junction. Stable cardiac and mediastinal contours. Minimal heterogeneous opacities left lung base. No pleural effusion or pneumothorax. Mid thoracic spine degenerative changes. IMPRESSION: Minimal heterogeneous opacities left lung base may represent atelectasis or infection. Electronically Signed   By: Lovey Newcomer M.D.   On: 01/10/2016 12:10   Background: 30 y.o. year-old [place] man with PMH of bronchitis and HIV diagnosed in 2014 which has never been treated. 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 In the ED he was found to have a creatinine of 8.09, bicarb of 16, serum albumin <1, UA with >300 protein, 6-30 RBC's. Renal ultrasound ->12.5, 12.7 cm kidneys, echodense.  He was given a liter of NS in the ED and then admitted for further evaluation. Labs that have returned so far->neg Hep B,C, ESR  140. ANCA, ANA, complements, cryoglobulins are all pending. HIV-1 RNA also pending. We are asked to see.  Assessment/Recommendations 1.  Renal Failure: with proteinuria and hematuria.   Normal renal function in Oct 2016 with creatinine of 1.08.  He presents with creatinine of 8 and proteinuria/microscopic hematuria (UPC could not be calculated d/t protein concentration ">3000") in the setting of untreated HIV.  Not sure how much improvement we could reasonably expect with treatment of his HIV. Likely near or at ESRD.   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 - Vein mapping completed. - VVS has seen patient to discuss permanent access.  Patient now agreeable to AVF placement here prior to  discharge. VVS updated on status by primary team. May have placement tomorrow if schedule allows. -Renal function worsening, will likely need dialysis soon. Spoke to patient about temporary HD line placement and preference for having this during AVF procedure. He has been reluctant this morning, will continue discussion. Primary team notified as well.  2.  Metabolic acidosis in setting of renal failure - on oral sodium bicarb started by primary service.  3. Neurosyphilis - Started on 2 weeks IV Penicillin course  4.  Anemia: likely due to CKD - Ferritin 839, Iron 46, Saturation ratio 43%  5. HIV - Since 2014. Untreated. Family is not aware of his diagnosis. - started on ART by ID - On Dapsone for PJP prophylaxis  6. Diarrhea, weight loss - improved  7.  Fevers - resolved  8.  Depressed mood - More interactive this morning.   Vishal Patel  [facility] PGY2 1:08 PM    Renal Attending:  We discussed placement of AVF and TDC at the same time.Marland Kitchen He is somewhat reluctant, but I hope he will be agreeable as I think he probably is uremic and unfortunately renal fct has worsened. Rhylee Nunn C

## 2016-01-11 NOTE — Anesthesia Preprocedure Evaluation (Addendum)
Anesthesia Evaluation  Patient identified by MRN, date of birth, ID band Patient awake    Reviewed: Allergy & Precautions, NPO status , Patient's Chart, lab work & pertinent test results  History of Anesthesia Complications Negative for: history of anesthetic complications  Airway Mallampati: III  TM Distance: >3 FB Neck ROM: Full    Dental  (+) Teeth Intact, Dental Advisory Given   Pulmonary shortness of breath (occasional), neg sleep apnea, neg COPD, neg recent URI, Current Smoker,    Pulmonary exam normal breath sounds clear to auscultation       Cardiovascular (-) angina+ DOE  (-) Past MI, (-) Cardiac Stents and (-) CABG  Rhythm:Regular Rate:Normal     Neuro/Psych  Headaches, PSYCHIATRIC DISORDERS Depression neurosyphilis    GI/Hepatic negative GI ROS, (+)     substance abuse  marijuana use,   Endo/Other  negative endocrine ROS  Renal/GU ARFRenal disease     Musculoskeletal   Abdominal (+) - obese,   Peds  Hematology  (+) Blood dyscrasia, anemia , HIV,   Anesthesia Other Findings   Reproductive/Obstetrics                            Anesthesia Physical Anesthesia Plan  ASA: IV  Anesthesia Plan: General   Post-op Pain Management:    Induction: Intravenous  Airway Management Planned: LMA  Additional Equipment:   Intra-op Plan:   Post-operative Plan: Extubation in OR  Informed Consent: I have reviewed the patients History and Physical, chart, labs and discussed the procedure including the risks, benefits and alternatives for the proposed anesthesia with the patient or authorized representative who has indicated his/her understanding and acceptance.   Dental advisory given  Plan Discussed with:   Anesthesia Plan Comments:       Anesthesia Quick Evaluation

## 2016-01-11 NOTE — Progress Notes (Signed)
Patients renal function worsening and may require dialysis soon. If at all possible we should try to coordinate his A-V fistula procedure with the placement of dialysis access to minimize discomfort to the patient.

## 2016-01-11 NOTE — Progress Notes (Signed)
Subjective: No acute events overnight. Patient's mood and affect improved this morning. Additionally, patient complaining of some reflux-like symptoms.  The patient stated that he is ready to get his A-V fistula so that he can be discharged home.  Objective:  Vital signs in last 24 hours: Vitals:   01/10/16 0455 01/10/16 1714 01/10/16 2101 01/11/16 0535  BP: (!) 139/91 (!) 151/91 (!) 143/96 (!) 147/96  Pulse: 83 84 84 75  Resp: _0 Temp: 98.4 F (36.9 C) 98.5 F (36.9 C) 98.3 F (36.8 C) 98.3 F (36.8 C)  TempSrc: Oral Oral Oral Oral  SpO2: 96% 99% 97% 95%  Weight:   174 lb 6.9 oz (79.1 kg)   Height:       Physical Exam  Constitutional: He is oriented to person, place, and time. He appears well-developed and well-nourished.  In no acute distress  HENT:  Head: Normocephalic and atraumatic.  Cardiovascular: Normal rate and regular rhythm.  Exam reveals no gallop and no friction rub.   No murmur heard. Respiratory: Effort normal. He has wheezes.  Wheezes in the bilateral lung fields which seem to be improved  GI: Soft. Bowel sounds are normal. He exhibits no distension. There is no tenderness.  Musculoskeletal: He exhibits no edema.  Neurological: He is alert and oriented to person, place, and time.  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  In brief, patient was started on antiretroviral therapy and IV penicillin. The patient had a placement of a right internal jugular tunneled catheter by interventional radiology this morning. Additionally, the patient was started on dapsone 100 mg once daily for PJP pneumonia prophylaxis. The patient is now agreeable to A-V fistula placement. He will be NPO  at midnight for A-V fistula tomorrow by vascular surgery. If they are unable to add him to the schedule they will let us know tomorrow.   Assessment & Plan by Problem: 1. 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 -- FeNagreater than 2% indicating intrinsic renal pathology -- Hep B and Hep C antibodies- both negative -- Oral sodium bicarbonate per renal recommendation, will continue 1300 mg 3 times a day -- Patient is agreeable to placement of A-V fistula  2. AIDS -- Patient states he was diagnosed with HIV 2-3 years ago and was never taking any antiretroviral therapy. -- He has not followed up with a doctor for this in the past. -- CD4 count 80 -- Patient started on TIVICAY plus AZT and Epivir. -- Pending results of HLA B701 we'll exchange AZT for abacavir -- Started on dapsone 100 mg once daily for PJP prophylaxis -- Cryptococcal antigen (blood) negative -- RPR (non-treponemal) and Fluorescent treponemal (treponemal) positive   3. Neurosyphilis  -- Start IV penicillin for treatment of neurosyphilis, patient will need this for approximately 2 weeks, this will be dosed by pharmacy -- Tunneled IJ catheter placed by interventional radiology on 01/09/2016 -- Continue IV penicillin  4. Diarrhea  -- Improving -- Loperamide 2 mg as needed for diarrhea  5. Chronic Cough -- AIDS (CD4 count 80) not on ARTcomplains of cough of one year duration  with greenish sputum production. Given his HIV status differential diagnosis for his chronic cough is extremely broad but does include atypical organism such as PCP pneumonia. Results of the HIV viral load and CD4 cell count will help differentiating potential infectious etiologies. -- We will walk the patient with O2 saturation today -- Consider starting prophylactic medication based on patient's CD4 count for PJP  pneumonia -- Chest x-ray normal -- No leukocytosis most recent white cell count of 7.6 -- Quantiferon tb Gold Assay  6. Non-anion gap metabolic acidosis -- CO2 of 16, sodium 138, chloride 113 -- Differential diagnosis includes renal tubular acidosis and chronic diarrhea -- Renal labs as above -- Oral sodium bicarbonate 1200 mg 3 times a day -- Renal biopsy on 8/7 will follow-up with results   7. Gastroesophageal reflux disease -- Patient complaining GERD-like symptoms -- We will encourage the patient to sit upright after meals  Dispo: Anticipated discharge in approximately 1-2 day(s).   Jonathon Shoulder, MD 01/11/2016, 8:37 AM Pager: [phone]

## 2016-01-11 NOTE — Progress Notes (Signed)
  Progress Note    01/11/2016 4:38 PM * No surgery date entered *  Subjective:  Feeling well today Concerned about appearance of avf but agreeable to proceed  Vitals:   01/11/16 0535 01/11/16 0750  BP: (!) 147/96 127/79  Pulse: 75 78  Resp: 16 16  Temp: 98.3 F (36.8 C) 97.3 F (36.3 C)    Physical Exam: Palpable left radial pulse No previous incisions on left arm  CBC    Component Value Date/Time   WBC 7.6 01/11/2016 0516   RBC 2.86 (L) 01/11/2016 0516   HGB 8.2 (L) 01/11/2016 0516   HCT 25.4 (L) 01/11/2016 0516   PLT 268 01/11/2016 0516   MCV 88.8 01/11/2016 0516   MCH 28.7 01/11/2016 0516   MCHC 32.3 01/11/2016 0516   RDW 15.2 01/11/2016 0516   LYMPHSABS 1.9 01/05/2016 0327   MONOABS 0.9 01/05/2016 0327   EOSABS 0.4 01/05/2016 0327   BASOSABS 0.0 01/05/2016 0327    BMET    Component Value Date/Time   NA 140 01/11/2016 0516   K 4.8 01/11/2016 0516   CL 111 01/11/2016 0516   CO2 18 (L) 01/11/2016 0516   GLUCOSE 96 01/11/2016 0516   BUN 74 (H) 01/11/2016 0516   CREATININE 9.25 (H) 01/11/2016 0516   CALCIUM 7.5 (L) 01/11/2016 0516   GFRNONAA 7 (L) 01/11/2016 0516   GFRAA 8 (L) 01/11/2016 0516    INR    Component Value Date/Time   INR 1.11 01/04/2016 1405     Intake/Output Summary (Last 24 hours) at 01/11/16 1638 Last data filed at 01/11/16 1553  Gross per 24 hour  Intake             3040 ml  Output              375 ml  Net             2665 ml     Assessment:  30 y.o. male with advanced ckd in need of initial dialysis access. Discussed with patient and will add on to schedule tomorrow for left 1st stage bvt.    Edie Darley C. Donzetta Matters, MD [facility] [place] Office: [phone] Pager: [phone]  01/11/2016 4:38 PM

## 2016-01-11 NOTE — Progress Notes (Signed)
Pt ambulated in hallway with RN.  SATURATION QUALIFICATIONS:   Patient Saturations on Room Air at Rest = 95% Patient Saturations on [employer] while Ambulating = 89-92% Patient Saturations on 2 Liters of oxygen while Ambulating = 100%  Carole Civil, RN, [facility] [address] [employer] [phone]

## 2016-01-12 ENCOUNTER — Inpatient Hospital Stay (HOSPITAL_COMMUNITY): Payer: Medicaid Other | Admitting: Anesthesiology

## 2016-01-12 ENCOUNTER — Encounter (HOSPITAL_COMMUNITY)
Admission: EM | Disposition: A | Discharge status: Home Health | Payer: Self-pay | Source: Home / Self Care | Attending: Student in an Organized Health Care Education/Training Program

## 2016-01-12 DIAGNOSIS — N186 End stage renal disease: Secondary | ICD-10-CM

## 2016-01-12 DIAGNOSIS — Z992 Dependence on renal dialysis: Secondary | ICD-10-CM

## 2016-01-12 DIAGNOSIS — R062 Wheezing: Secondary | ICD-10-CM

## 2016-01-12 HISTORY — PX: BASCILIC VEIN TRANSPOSITION: SHX5742

## 2016-01-12 LAB — MISC LABCORP TEST (SEND OUT): LABCORP TEST CODE: 161414

## 2016-01-12 LAB — CBC
HCT: 24.8 % — ABNORMAL LOW (ref 39.0–52.0)
Hemoglobin: 8.2 g/dL — ABNORMAL LOW (ref 13.0–17.0)
MCH: 29.1 pg (ref 26.0–34.0)
MCHC: 33.1 g/dL (ref 30.0–36.0)
MCV: 87.9 fL (ref 78.0–100.0)
PLATELETS: 284 10*3/uL (ref 150–400)
RBC: 2.82 MIL/uL — ABNORMAL LOW (ref 4.22–5.81)
RDW: 15.2 % (ref 11.5–15.5)
WBC: 6.6 10*3/uL (ref 4.0–10.5)

## 2016-01-12 LAB — BASIC METABOLIC PANEL
Anion gap: 9 (ref 5–15)
BUN: 72 mg/dL — AB (ref 6–20)
CHLORIDE: 117 mmol/L — AB (ref 101–111)
CO2: 16 mmol/L — ABNORMAL LOW (ref 22–32)
CREATININE: 9.39 mg/dL — AB (ref 0.61–1.24)
Calcium: 7.6 mg/dL — ABNORMAL LOW (ref 8.9–10.3)
GFR calc Af Amer: 8 mL/min — ABNORMAL LOW (ref >60)
GFR calc non Af Amer: 7 mL/min — ABNORMAL LOW (ref >60)
Glucose, Bld: 105 mg/dL — ABNORMAL HIGH (ref 65–99)
Potassium: 4.9 mmol/L (ref 3.5–5.1)
SODIUM: 142 mmol/L (ref 135–145)

## 2016-01-12 LAB — SURGICAL PCR SCREEN
MRSA, PCR: NEGATIVE
Staphylococcus aureus: POSITIVE — AB

## 2016-01-12 LAB — ANAEROBIC CULTURE

## 2016-01-12 SURGERY — TRANSPOSITION, VEIN, BASILIC
Anesthesia: General | Site: Arm Upper | Laterality: Left

## 2016-01-12 MED ORDER — ONDANSETRON HCL 4 MG/2ML IJ SOLN
INTRAMUSCULAR | Status: DC | PRN
  Administered 2016-01-12: 4 mg via INTRAVENOUS

## 2016-01-12 MED ORDER — PROPOFOL 10 MG/ML IV BOLUS
INTRAVENOUS | Status: DC | PRN
  Administered 2016-01-12: 150 mg via INTRAVENOUS
  Administered 2016-01-12: 20 mg via INTRAVENOUS

## 2016-01-12 MED ORDER — CHLORHEXIDINE GLUCONATE CLOTH 2 % EX PADS
6.0000 | MEDICATED_PAD | Freq: Every day | CUTANEOUS | Status: DC
  Administered 2016-01-13: 6 via TOPICAL

## 2016-01-12 MED ORDER — OXYCODONE-ACETAMINOPHEN 5-325 MG PO TABS
ORAL_TABLET | ORAL | Status: AC
  Filled 2016-01-12: qty 1

## 2016-01-12 MED ORDER — PHENYLEPHRINE HCL 10 MG/ML IJ SOLN
INTRAMUSCULAR | Status: DC | PRN
  Administered 2016-01-12 (×4): 80 ug via INTRAVENOUS

## 2016-01-12 MED ORDER — SUCCINYLCHOLINE CHLORIDE 200 MG/10ML IV SOSY
PREFILLED_SYRINGE | INTRAVENOUS | Status: AC
  Filled 2016-01-12: qty 10

## 2016-01-12 MED ORDER — LIDOCAINE HCL (PF) 1 % IJ SOLN
INTRAMUSCULAR | Status: AC
  Filled 2016-01-12: qty 30

## 2016-01-12 MED ORDER — PHENYLEPHRINE 40 MCG/ML (10ML) SYRINGE FOR IV PUSH (FOR BLOOD PRESSURE SUPPORT)
PREFILLED_SYRINGE | INTRAVENOUS | Status: AC
  Filled 2016-01-12: qty 10

## 2016-01-12 MED ORDER — PROPOFOL 10 MG/ML IV BOLUS
INTRAVENOUS | Status: AC
  Filled 2016-01-12: qty 20

## 2016-01-12 MED ORDER — FENTANYL CITRATE (PF) 250 MCG/5ML IJ SOLN
INTRAMUSCULAR | Status: AC
  Filled 2016-01-12: qty 5

## 2016-01-12 MED ORDER — OXYCODONE-ACETAMINOPHEN 5-325 MG PO TABS
1.0000 | ORAL_TABLET | Freq: Four times a day (QID) | ORAL | Status: DC | PRN
Start: 2016-01-12 — End: 2016-01-13
  Administered 2016-01-12: 1 via ORAL

## 2016-01-12 MED ORDER — ONDANSETRON HCL 4 MG/2ML IJ SOLN
INTRAMUSCULAR | Status: AC
  Filled 2016-01-12: qty 2

## 2016-01-12 MED ORDER — LIDOCAINE HCL (CARDIAC) 20 MG/ML IV SOLN
INTRAVENOUS | Status: DC | PRN
  Administered 2016-01-12: 100 mg via INTRAVENOUS

## 2016-01-12 MED ORDER — ONDANSETRON HCL 4 MG/2ML IJ SOLN
4.0000 mg | Freq: Two times a day (BID) | INTRAMUSCULAR | Status: DC | PRN
  Administered 2016-01-12: 4 mg via INTRAVENOUS
  Filled 2016-01-12: qty 2

## 2016-01-12 MED ORDER — HEPARIN SODIUM (PORCINE) 5000 UNIT/ML IJ SOLN
INTRAMUSCULAR | Status: DC | PRN
  Administered 2016-01-12: 500 mL

## 2016-01-12 MED ORDER — SODIUM CHLORIDE 0.9 % IV SOLN
INTRAVENOUS | Status: DC | PRN
  Administered 2016-01-12 (×2): via INTRAVENOUS

## 2016-01-12 MED ORDER — LIDOCAINE 2% (20 MG/ML) 5 ML SYRINGE
INTRAMUSCULAR | Status: AC
  Filled 2016-01-12: qty 5

## 2016-01-12 MED ORDER — MIDAZOLAM HCL 5 MG/5ML IJ SOLN
INTRAMUSCULAR | Status: DC | PRN
  Administered 2016-01-12: 2 mg via INTRAVENOUS

## 2016-01-12 MED ORDER — HEMOSTATIC AGENTS (NO CHARGE) OPTIME
TOPICAL | Status: DC | PRN
  Administered 2016-01-12: 1 via TOPICAL

## 2016-01-12 MED ORDER — MUPIROCIN 2 % EX OINT
1.0000 "application " | TOPICAL_OINTMENT | Freq: Two times a day (BID) | CUTANEOUS | Status: DC
  Administered 2016-01-12 – 2016-01-13 (×2): 1 via NASAL
  Filled 2016-01-12 (×2): qty 22

## 2016-01-12 MED ORDER — FENTANYL CITRATE (PF) 100 MCG/2ML IJ SOLN
INTRAMUSCULAR | Status: AC
  Filled 2016-01-12: qty 2

## 2016-01-12 MED ORDER — FENTANYL CITRATE (PF) 250 MCG/5ML IJ SOLN
INTRAMUSCULAR | Status: DC | PRN
  Administered 2016-01-12: 50 ug via INTRAVENOUS
  Administered 2016-01-12 (×2): 25 ug via INTRAVENOUS

## 2016-01-12 MED ORDER — PROMETHAZINE HCL 25 MG/ML IJ SOLN: 6.2500 mg | INTRAMUSCULAR | Status: DC | PRN

## 2016-01-12 MED ORDER — FENTANYL CITRATE (PF) 100 MCG/2ML IJ SOLN
25.0000 ug | INTRAMUSCULAR | Status: DC | PRN
  Administered 2016-01-12: 75 ug via INTRAVENOUS

## 2016-01-12 MED ORDER — MIDAZOLAM HCL 2 MG/2ML IJ SOLN
INTRAMUSCULAR | Status: AC
  Filled 2016-01-12: qty 2

## 2016-01-12 MED ORDER — 0.9 % SODIUM CHLORIDE (POUR BTL) OPTIME
TOPICAL | Status: DC | PRN
  Administered 2016-01-12: 1000 mL

## 2016-01-12 SURGICAL SUPPLY — 40 items
AGENT HMST SPONGE THK3/8 (HEMOSTASIS) ×1
ARMBAND PINK RESTRICT EXTREMIT (MISCELLANEOUS) ×3 IMPLANT
CANISTER SUCTION 2500CC (MISCELLANEOUS) ×3 IMPLANT
CLIP TI MEDIUM 24 (CLIP) ×3 IMPLANT
CLIP TI WIDE RED SMALL 24 (CLIP) ×3 IMPLANT
CORDS BIPOLAR (ELECTRODE) IMPLANT
COVER PROBE W GEL 5X96 (DRAPES) ×2 IMPLANT
DECANTER SPIKE VIAL GLASS SM (MISCELLANEOUS) ×3 IMPLANT
ELECT REM PT RETURN 9FT ADLT (ELECTROSURGICAL) ×3
ELECTRODE REM PT RTRN 9FT ADLT (ELECTROSURGICAL) ×1 IMPLANT
GLOVE BIO SURGEON STRL SZ7 (GLOVE) ×3 IMPLANT
GLOVE BIOGEL PI IND STRL 6.5 (GLOVE) IMPLANT
GLOVE BIOGEL PI IND STRL 7.0 (GLOVE) IMPLANT
GLOVE BIOGEL PI IND STRL 7.5 (GLOVE) ×1 IMPLANT
GLOVE BIOGEL PI INDICATOR 6.5 (GLOVE) ×4
GLOVE BIOGEL PI INDICATOR 7.0 (GLOVE) ×2
GLOVE BIOGEL PI INDICATOR 7.5 (GLOVE) ×4
GLOVE ECLIPSE 6.5 STRL STRAW (GLOVE) ×2 IMPLANT
GLOVE ECLIPSE 7.0 STRL STRAW (GLOVE) ×2 IMPLANT
GOWN STRL REUS W/ TWL LRG LVL3 (GOWN DISPOSABLE) ×3 IMPLANT
GOWN STRL REUS W/ TWL XL LVL3 (GOWN DISPOSABLE) IMPLANT
GOWN STRL REUS W/TWL LRG LVL3 (GOWN DISPOSABLE) ×6
GOWN STRL REUS W/TWL XL LVL3 (GOWN DISPOSABLE) ×3
HEMOSTAT SPONGE AVITENE ULTRA (HEMOSTASIS) ×2 IMPLANT
KIT BASIN OR (CUSTOM PROCEDURE TRAY) ×3 IMPLANT
KIT ROOM TURNOVER OR (KITS) ×3 IMPLANT
LIQUID BAND (GAUZE/BANDAGES/DRESSINGS) ×3 IMPLANT
NS IRRIG 1000ML POUR BTL (IV SOLUTION) ×3 IMPLANT
PACK CV ACCESS (CUSTOM PROCEDURE TRAY) ×3 IMPLANT
PAD ARMBOARD 7.5X6 YLW CONV (MISCELLANEOUS) ×6 IMPLANT
SUT MNCRL AB 4-0 PS2 18 (SUTURE) ×5 IMPLANT
SUT PROLENE 6 0 BV (SUTURE) ×3 IMPLANT
SUT PROLENE 7 0 BV 1 (SUTURE) ×8 IMPLANT
SUT SILK 2 0 SH (SUTURE) ×1 IMPLANT
SUT VIC AB 2-0 CT1 27 (SUTURE)
SUT VIC AB 2-0 CT1 TAPERPNT 27 (SUTURE) ×1 IMPLANT
SUT VIC AB 3-0 SH 27 (SUTURE) ×6
SUT VIC AB 3-0 SH 27X BRD (SUTURE) ×2 IMPLANT
UNDERPAD 30X30 INCONTINENT (UNDERPADS AND DIAPERS) ×3 IMPLANT
WATER STERILE IRR 1000ML POUR (IV SOLUTION) ×3 IMPLANT

## 2016-01-12 NOTE — H&P (View-Only) (Signed)
Hospital Consult    Reason for Consult:  In need of permanent HD access Referring Physician:  Dr. Juleen China MRN #:  [id number]  History of Present Illness: This is a 30 y.o. male who presented to the hospital last week with c/o shortness of breath and diarrhea.  He also says that when he looked in the mirror, he was skinny, loosing weight and has always been cut and wanted to check into what was wrong.  He is unsure of how much weight he has lost.  He states he has been having diarrhea and this is continuing.  He states that he woke up on Sunday with a mouth full of dark blood unsure where it came from.  He says it happened one more time, but just not as much.  He does have a hx of HIV, which has not been treated.  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  He is also now on treatment for his HIV/AIDS.   Blood cx on 8/6 are NGTD.  He is continuing to have low grades fevers, which is being worked up.    He continues to smoke, but talks about quitting.  He is currently not using any street drugs, but has hx of IV drug use.   His family/friends are unaware of his HIV/neurosyphilis diagnosis.     Past Medical History:  Diagnosis Date  . Bronchitis   . Hemorrhoids   . HIV (human immunodeficiency virus infection) ([place])     History reviewed. No pertinent surgical history.  No Known Allergies  Prior to Admission medications   Medication Sig Start Date End Date Taking? Authorizing Provider  ibuprofen (ADVIL,MOTRIN) 200 MG tablet Take 400 mg by mouth 2 (two) times daily as needed for headache or moderate pain.   Yes Historical Provider, MD  dolutegravir (TIVICAY) 50 MG tablet Take 1 tablet (50 mg total) by mouth daily. 01/07/16   Michel Bickers, MD  lamiVUDine (EPIVIR) 150 MG tablet Take 1 tablet (150 mg total) by mouth 2 (two) times daily. 01/07/16   Michel Bickers, MD  zidovudine (RETROVIR) 100 MG capsule Take 1 capsule (100 mg total) by mouth  2 (two) times daily. 01/07/16   Michel Bickers, MD    Social History   Social History  . Marital status: Single    Spouse name: N/A  . Number of children: N/A  . Years of education: N/A   Occupational History  . Not on file.   Social History Main Topics  . Smoking status: Current Every Day Smoker    Packs/day: 0.20  . Smokeless tobacco: Never Used  . Alcohol use Yes     Comment: weekly  . Drug use:     Types: Marijuana  . Sexual activity: Not on file   Other Topics Concern  . Not on file   Social History Narrative  . No narrative on file     History reviewed. No pertinent family history. Mother is living  ROS: [x]  Positive   [ ]  Negative   [ ]  All sytems reviewed and are negative  Cardiovascular: []  chest pain/pressure []  palpitations [x]  SOB lying flat [x DOE []  pain in legs while walking []  pain in legs at rest []  pain in legs at night []  non-healing ulcers []  hx of DVT []  swelling in legs   Pulmonary: [x]  productive cough [x]  wheezing []  home O2  Neurologic: []  weakness in []  arms []  legs []  numbness in []   arms []  legs []  hx of CVA []  mini stroke [] difficulty speaking or slurred speech []  temporary loss of vision in one eye []  dizziness [x]  headaches  Hematologic: []  hx of cancer []  bleeding problems []  problems with blood clotting easily  Infectious Diseasae [x]  newly treated HIV/AIDS [x]  neurosyphyllis   Endocrine:   []  diabetes []  thyroid disease  GI []  vomiting blood []  blood in stool [x]  diarrhea  GU: [x]  CKD/renal failure []  HD--[]  M/W/F or []  T/T/S []  burning with urination []  blood in urine  Psychiatric: []  anxiety [x]  depression  Musculoskeletal: []  arthritis []  joint pain  Integumentary: []  rashes []  ulcers  Constitutional: [x]  fever []  chills [x]  weight loss [x]  fatigue   Physical Examination  Vitals:   01/07/16 2043 01/08/16 0627  BP: (!) 148/83 121/72  Pulse: 98 87  Resp: 18 18  Temp: (!) 100.9  F (38.3 C) 99 F (37.2 C)   Body mass index is 23.11 kg/m.  General:  WDWN in NAD Gait: Not observed HENT: WNL, normocephalic Pulmonary: normal non-labored breathing, without Rales, rhonchi,  wheezing Cardiac: regular, without  Murmurs, rubs or gallops; without carotid bruits Abdomen:  soft, NT/ND, no masses Skin: without rashes Vascular Exam/Pulses:  Right Left  Radial 2+ (normal) 2+ (normal)  Brachial 2+ (normal) trace  Ulnar 1+ (weak) 1+ (weak)  DP 2+ (normal) 2+ (normal)  PT 2+ (normal) Unable to palpate    Extremities: without ischemic changes, without Gangrene , without cellulitis; without open wounds;  Musculoskeletal: no muscle wasting or atrophy  Neurologic: A&O X 3; moving all extremities equally. Speech is fluent/normal Psychiatric:  Flat affect Lymph: no palpable cervical or inguinal LAD   CBC    Component Value Date/Time   WBC 6.7 01/08/2016 0330   RBC 2.91 (L) 01/08/2016 0330   HGB 8.4 (L) 01/08/2016 0330   HCT 25.6 (L) 01/08/2016 0330   PLT 239 01/08/2016 0330   MCV 88.0 01/08/2016 0330   MCH 28.9 01/08/2016 0330   MCHC 32.8 01/08/2016 0330   RDW 14.7 01/08/2016 0330   LYMPHSABS 1.9 01/05/2016 0327   MONOABS 0.9 01/05/2016 0327   EOSABS 0.4 01/05/2016 0327   BASOSABS 0.0 01/05/2016 0327    BMET    Component Value Date/Time   NA 137 01/08/2016 0330   K 4.3 01/08/2016 0330   CL 114 (H) 01/08/2016 0330   CO2 17 (L) 01/08/2016 0330   GLUCOSE 93 01/08/2016 0330   BUN 71 (H) 01/08/2016 0330   CREATININE 8.17 (H) 01/08/2016 0330   CALCIUM 7.1 (L) 01/08/2016 0330   GFRNONAA 8 (L) 01/08/2016 0330   GFRAA 9 (L) 01/08/2016 0330    COAGS: Lab Results  Component Value Date   INR 1.11 01/04/2016     Non-Invasive Vascular Imaging:   Upper Extremity Vein Mapping 06/03/76: Right Cephalic Segment Diameter Depth Comment  1. Axilla mm mm   2. Mid upper arm 1.2mm mm   3. Above [facility] W 1.50mm mm   4. In [facility] 1.89mm mm   5. Below AC mm mm   6. Mid  forearm mm mm   7. Wrist mm mm    Right Basilic Segment Diameter Depth Comment  1. Axilla mm mm   2. Mid upper arm 5.38mm 7.60mm   3. Above AC 5.49mm 6.40mm   4. In [facility] 3.17mm 4.16mm Multiple branches  5. Below AC mm mm   6. Mid forearm mm mm   7. Wrist mm mm    Left Cephalic too  small to visualize  Left Basilic Segment Diameter Depth Comment  1. Axilla mm mm   2. Mid upper arm 4.51mm 7.3mm   3. Above [facility] 4.27mm 4.27mm   4. In [facility] 3.74mm mm Multiple branches  5. Below AC mm mm   6. Mid forearm mm mm   7. Wrist mm mm      Statin:  No. Beta Blocker:  No. Aspirin:  No. ACEI:  No. ARB:  No. Other antiplatelets/anticoagulants:  No.    ASSESSMENT/PLAN: This is a 30 y.o. male with AKI in setting of untreated HIV/AIDS in need of permanent HD access. Pt is right hand dominant.  FAMILY & FRIENDS ARE NOT AWARE OF HIS CURRENT DIAGNOSIS OF HIV/NEUROSYPHYLIS.    -Pt is right hand dominant.  His cephalic veins on vein mapping are not adequate.  His left basilic appears to be adequate and will most likely require a 1st stage basilic vein transposition.   -will d/w Dr. Bridgett Larsson and he will be by to see the pt later this afternoon. -pt has not been letting blood draws get done from left arm.  Will restrict left arm.   Leontine Locket, PA-C [facility] [phone]  Addendum  I have independently interviewed and examined the patient, and I agree with the physician assistant's findings.  Will need a staged L BVT given size of the basilic vein.   Risk, benefits, and alternatives to access surgery were discussed.    The patient is aware the risks include but are not limited to: bleeding, infection, steal syndrome, nerve damage, ischemic monomelic neuropathy, failure to mature, need for additional procedures, death and stroke.    The patient has not agreed to proceed with the recommended procedure.  Will schedule him tenatively for Wednesday, 16 AUG 17, to  give him time to consider proceeding.  Adele Barthel, MD [facility] [place] Office: [phone] Pager: [phone]  01/08/2016, 2:32 PM

## 2016-01-12 NOTE — Op Note (Signed)
OPERATIVE NOTE   PROCEDURE: 1. left first stage basilic vein transposition (brachiobasilic arteriovenous fistula) placement  PRE-OPERATIVE DIAGNOSIS: chronic kidney disease stage V   POST-OPERATIVE DIAGNOSIS: same as above   SURGEON: Adele Barthel, MD  ASSISTANT(S): Gerri Lins, PAC   ANESTHESIA: local and MAC  ESTIMATED BLOOD LOSS: 50 cc  FINDING(S): 1. Palpable thrill at end of case 2. Palpable radial pulse at end of case 3. Small cubital veins 4. Large brachial vein medial to brachial artery: 3-4 mm  SPECIMEN(S):  none  INDICATIONS:   Jonathon Moore is a 30 y.o. 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  The patient is scheduled for left first stage basilic vein transposition.  The patient is aware the risks include but are not limited to: bleeding, infection, steal syndrome, nerve damage, ischemic monomelic neuropathy, failure to mature, and need for additional procedures.  The patient is aware of the risks of the procedure and elects to proceed forward.  DESCRIPTION: After full informed written consent was obtained from the patient, the patient was brought back to the operating room and placed supine upon the operating table.  Prior to induction, the patient received IV antibiotics.   After obtaining adequate anesthesia, the patient was then prepped and draped in the standard fashion for a left arm access procedure.  I turned my attention first to identifying the patient's basilic vein and brachial artery.  Using SonoSite guidance, the location of these vessels were marked out on the skin.   I made a transverse incision at the level of the antecubitum and dissected through the subcutaneous tissue and fascia to gain exposure of the brachial artery.  This was noted to be 2.5 mm in diameter externally.  This was dissected out proximally and distally and controlled with vessel loops .  I then dissected out the basilic vein.  This was noted  to be 2.5-3.0 mm in diameter externally.  The distal segment of the vein was ligated with a  2-0 silk, and the vein was transected.  The proximal segment was interrogated with serial dilators.  The vein accepted up to a 3.5 mm dilator without any difficulty.  There was resistance in the distal vein encountered from passing a 2.5 mm, 3.0 mm and 3.5 mm.  I then instilled the heparinized saline into the vein and clamped it.  At this point, I reset my exposure of the brachial artery and placed the artery under tension proximally and distally.  I made an arteriotomy with a #11 blade, and then I extended the arteriotomy with a Potts scissor.  I injected heparinized saline proximal and distal to this arteriotomy.  The vein was then sewn to the artery in an end-to-side configuration with a running stitch of 7-0 Prolene.  Prior to completing this anastomosis, I allowed the vein and artery to backbleed.  There was no evidence of clot from any vessels.  I completed the anastomosis in the usual fashion and then released all vessel loops and clamps.  There was a palpable thrill in the venous outflow, and there was a palpable radial pulse.  At this point, I irrigated out the surgical wound.  There was no further active bleeding.  The subcutaneous tissue was reapproximated with a running stitch of 3-0 Vicryl.  The skin was then reapproximated with a running subcuticular stitch of 4-0 Vicryl.  The skin was then cleaned, dried, and reinforced with Dermabond.  The patient tolerated this procedure well.  COMPLICATIONS: none  CONDITION: stable  Adele Barthel, MD, [facility] Office: [phone] Pager: [phone]  01/12/2016, 9:10 AM

## 2016-01-12 NOTE — Anesthesia Procedure Notes (Signed)
Procedure Name: LMA Insertion Date/Time: 01/12/2016 7:38 AM Performed by: Myna Bright Pre-anesthesia Checklist: Patient identified, Emergency Drugs available, Suction available and Patient being monitored Patient Re-evaluated:Patient Re-evaluated prior to inductionOxygen Delivery Method: Circle system utilized Preoxygenation: Pre-oxygenation with 100% oxygen Intubation Type: IV induction Ventilation: Mask ventilation without difficulty LMA: LMA inserted LMA Size: 4.0 Tube type: Oral Number of attempts: 1 Placement Confirmation: positive ETCO2 and breath sounds checked- equal and bilateral Tube secured with: Tape Dental Injury: Teeth and Oropharynx as per pre-operative assessment

## 2016-01-12 NOTE — Transfer of Care (Signed)
Immediate Anesthesia Transfer of Care Note  Patient: Jonathon Moore  Procedure(s) Performed: Procedure(s): LEFT ARM FIRST STAGE [place] (Left)  Patient Location: PACU  Anesthesia Type:General  Level of Consciousness: awake, alert , oriented and patient cooperative  Airway & Oxygen Therapy: Patient Spontanous Breathing and Patient connected to nasal cannula oxygen  Post-op Assessment: Report given to RN, Post -op Vital signs reviewed and stable and Patient moving all extremities  Post vital signs: Reviewed and stable  Last Vitals:  Vitals:   01/11/16 2013 01/12/16 0616  BP: 130/78 (!) 144/84  Pulse: 75 74  Resp: 18 20  Temp: 36.7 C 37.1 C    Last Pain:  Vitals:   01/12/16 0616  TempSrc: Oral  PainSc:       Patients Stated Pain Goal: 0 (78/67/67 2094)  Complications: No apparent anesthesia complications

## 2016-01-12 NOTE — Progress Notes (Signed)
Subjective: No acute events overnight. Patient in the operating room this morning for fistula placement for dialysis access. No complaints this afternoon. Patient taking good by mouth intake. He has no additional acute complaints or concerns.   Objective:  Vital signs in last 24 hours: Vitals:   01/11/16 0750 01/11/16 2013 01/12/16 0616 01/12/16 0915  BP: 127/79 130/78 (!) 144/84   Pulse: 78 75 74 (!) 108  Resp: _0 Temp: 97.3 F (36.3 C) 98 F (36.7 C) 98.8 F (37.1 C) 97.8 F (36.6 C)  TempSrc: Oral Oral Oral   SpO2: 97% 93% 96% 100%  Weight:  174 lb 2.6 oz (79 kg)    Height:       Physical Exam  Constitutional: He appears well-developed and well-nourished.  In no acute distress  HENT:  Head: Normocephalic and atraumatic.  Cardiovascular: Normal rate and regular rhythm.  Exam reveals no gallop and no friction rub.   No murmur heard. Respiratory: Effort normal. He has wheezes.  Wheezes in the bilateral lung fields which seem to be improved  GI: Soft. Bowel sounds are normal. He exhibits no distension. There is no tenderness.  Musculoskeletal: He exhibits no edema.  Psychiatric:  Mood is improved today     Assessment/Plan: Jonathon Moore is a 30 year old male with AIDS (CD4 count 80) not on ART ( started ART on 01/07/2016)therapywho presents with a 4-5 week history of increased fatigue, cough and diarrhea found to have renal failure and neurosyphilis based on lumbar puncture.  In brief, patient receiving IV penicillin for neurosyphilis. Patient started on antiretroviral therapy and dapsone prophylaxis. Patient with a right tunneled IJ catheter. He will have first stage basilic vein transposition today.   Assessment & Plan by Problem: 1. 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 -- FeNagreater than 2% indicating intrinsic renal  pathology -- Hep B and Hep C antibodies- both negative -- Oral sodium bicarbonate per renal recommendation, will continue 1300 mg 3 times a day -- Patient in OR today with vascular surgery  2. AIDS -- Patient states he was diagnosed with HIV 2-3 years ago and was never taking any antiretroviral therapy. -- He has not followed up with a doctor for this in the past. -- CD4 count 80 -- Patient started on TIVICAY plus AZT and Epivir. -- Pending results of HLA B701 we'll exchange AZT for abacavir -- Started on dapsone 100 mg once daily for PJP prophylaxis -- Cryptococcal antigen (blood) negative -- RPR (non-treponemal) and Fluorescent treponemal (treponemal) positive   3. Neurosyphilis  -- Start IV penicillin for treatment of neurosyphilis, patient will need this for approximately 2 weeks, this will be dosed by pharmacy -- Tunneled IJ catheter placed by interventional radiology on 01/09/2016 -- Continue IV penicillin  4. Diarrhea  -- Improving -- Loperamide 2 mg as needed for diarrhea  5. Chronic Cough -- AIDS (CD4 count 80) not on ARTcomplains of cough of one year duration with greenish sputum production. Given his HIV status differential diagnosis for his chronic cough is extremely broad but does include atypical organism such as PCP pneumonia. Results of the HIV viral load and CD4 cell count will help differentiating potential infectious etiologies. -- We will walk the patient with O2 saturation today -- Consider starting prophylactic medication based on patient's CD4 count for PJP pneumonia -- Chest x-ray normal -- No leukocytosis most recent white cell  count of 7.6 -- Quantiferon tb Gold Assay   Dispo: Anticipated discharge in approximately 1-2 day(s).   Ophelia Shoulder, MD 01/12/2016, 9:47 AM Pager: [phone]

## 2016-01-12 NOTE — Anesthesia Postprocedure Evaluation (Signed)
Anesthesia Post Note  Patient: Jonathon Moore  Procedure(s) Performed: Procedure(s) (LRB): LEFT ARM FIRST STAGE [place] (Left)  Patient location during evaluation: PACU Anesthesia Type: General Level of consciousness: awake and alert Pain management: pain level controlled Vital Signs Assessment: post-procedure vital signs reviewed and stable Respiratory status: spontaneous breathing, nonlabored ventilation, respiratory function stable and patient connected to nasal cannula oxygen Cardiovascular status: blood pressure returned to baseline and stable Postop Assessment: no signs of nausea or vomiting Anesthetic complications: no    Last Vitals:  Vitals:   01/12/16 1000 01/12/16 1007  BP: (!) 149/106 (!) 135/95  Pulse: 83 75  Resp:    Temp:      Last Pain:  Vitals:   01/12/16 1007  TempSrc:   PainSc: Asleep                 Nilda Simmer

## 2016-01-12 NOTE — Progress Notes (Signed)
[facility] Rounding Note Subjective: Seen and examined this morning about 11:30am after his surgery. No new complaints. S/p left first stage basilic vein transposition (brachiobasilic arteriovenous fistula) placement with Dr. Bridgett Larsson.  Objective: BP (!) 149/102 (BP Location: Left Arm)   Pulse 78   Temp 98.5 F (36.9 C) (Oral)   Resp (!) 24   Ht 6' (1.829 m) Comment: from previous admission  Wt 174 lb 2.6 oz (79 kg)   SpO2 98%   BMI 23.62 kg/m    Intake/Output Summary (Last 24 hours) at 01/12/16 1136 Last data filed at 01/12/16 0941  Gross per 24 hour  Intake             2560 ml  Output              950 ml  Net             1610 ml    Weight change: -4.2 oz (-0.12 kg)  EXAM: General: lying in bed, still somewhat sedated. Cardiac: RRR Lungs: CTAB Ext: warm and well perfused, no pedal edema Neuro: alert and oriented Psych: reserved, pleasant  Labs: Basic Metabolic Panel:  Recent Labs Lab 01/07/16 0501 01/08/16 0330 01/11/16 0516 01/12/16 0535  NA 138 137 140 142  K 4.4 4.3 4.8 4.9  CL 113* 114* 111 117*  CO2 16* 17* 18* 16*  GLUCOSE 91 93 96 105*  BUN 68* 71* 74* 72*  CREATININE 7.88* 8.17* 9.25* 9.39*  CALCIUM 7.2* 7.1* 7.5* 7.6*  PHOS 4.3 5.1* 4.3  --     Liver Function Tests:  Recent Labs Lab 01/07/16 0501 01/08/16 0330 01/11/16 0516  ALBUMIN <1.0* <1.0* <1.0*   No results for input(s): LIPASE, AMYLASE in the last 168 hours. CBC:  Recent Labs Lab 01/08/16 0330 01/11/16 0516 01/12/16 0535  WBC 6.7 7.6 6.6  HGB 8.4* 8.2* 8.2*  HCT 25.6* 25.4* 24.8*  MCV 88.0 88.8 87.9  PLT 239 268 284    Cardiac Enzymes: No results for input(s): CKTOTAL, CKMB, CKMBINDEX, TROPONINI in the last 168 hours.  Studies/Results: Dg Chest 2 View  Result Date: 01/10/2016 CLINICAL DATA:  Patient with cough and wheezing.  Renal failure. EXAM: CHEST  2 VIEW COMPARISON:  Chest radiograph 01/03/2016 FINDINGS: Venous catheter tip projects over the superior cavoatrial  junction. Stable cardiac and mediastinal contours. Minimal heterogeneous opacities left lung base. No pleural effusion or pneumothorax. Mid thoracic spine degenerative changes. IMPRESSION: Minimal heterogeneous opacities left lung base may represent atelectasis or infection. Electronically Signed   By: Lovey Newcomer M.D.   On: 01/10/2016 12:10   Background: 30 y.o. year-old [place] man with PMH of bronchitis and HIV diagnosed in 2014 which has never been treated. 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 In the ED he was found to have a creatinine of 8.09, bicarb of 16, serum albumin <1, UA with >300 protein, 6-30 RBC's. Renal ultrasound ->12.5, 12.7 cm kidneys, echodense.  He was given a liter of NS in the ED and then admitted for further evaluation. Labs that have returned so far->neg Hep B,C, ESR 140. ANCA, ANA, complements, cryoglobulins are all pending. HIV-1 RNA also pending. We are asked to see.  Assessment/Recommendations 1.  Renal Failure: with proteinuria and hematuria.   Normal renal function in Oct 2016 with  creatinine of 1.08.  He presents with creatinine of 8 and proteinuria/microscopic hematuria (UPC could not be calculated d/t protein concentration ">3000") in the setting of untreated HIV.   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 - Had left first stage basilic vein transposition (brachiobasilic arteriovenous fistula) placement today with Dr. Bridgett Larsson. -Renal function worsening, will likely need dialysis soon. Still needs temporary HD line placement   2.  Metabolic acidosis in setting of renal failure - on oral sodium bicarb started by primary service.  3. Neurosyphilis - Started on 2 weeks IV Penicillin  course  4.  Anemia: likely due to CKD - Ferritin 839, Iron 46, Saturation ratio 43%  5. HIV - Since 2014. Untreated. Family is not aware of his diagnosis. - started on ART by ID - On Dapsone for PJP prophylaxis  6. Diarrhea, weight loss - improved  7.  Fevers - resolved  8.  Depressed mood    Jule Ser  [facility] PGY2 11:36 AM

## 2016-01-12 NOTE — Progress Notes (Signed)
Rec'd critical lab result:  Staph Aureus Positive.  Standing Orders Implemented.  Will continue to monitor patient.  [employer] RN-BC, WTA.

## 2016-01-12 NOTE — Progress Notes (Signed)
Subjective:  Says he is tolerating his ARV but c/o nausea and pain at the AV fistula site  Antibiotics:  Anti-infectives    Start     Dose/Rate Route Frequency Ordered Stop   01/12/16 0715  cefUROXime (ZINACEF) 1.5 g in dextrose 5 % 50 mL IVPB     1.5 g 100 mL/hr over 30 Minutes Intravenous To [facility] 01/11/16 1310 01/12/16 0748   01/09/16 1000  dapsone tablet 100 mg     100 mg Oral Daily 01/09/16 0821     01/08/16 1000  lamiVUDine (EPIVIR) 10 MG/ML solution 50 mg     50 mg Oral Daily 01/07/16 1636     01/07/16 1700  dolutegravir (TIVICAY) tablet 50 mg     50 mg Oral Daily 01/07/16 1636     01/07/16 1700  zidovudine (RETROVIR) capsule 300 mg     300 mg Oral Daily 01/07/16 1636     01/07/16 1700  lamiVUDine (EPIVIR) tablet 150 mg     150 mg Oral Once 01/07/16 1636 01/07/16 1831   01/07/16 0915  penicillin G potassium 4 Million Units in dextrose 5 % 250 mL IVPB     4 Million Units 250 mL/hr over 60 Minutes Intravenous Every 8 hours 01/07/16 0855        Medications: Scheduled Meds: . Chlorhexidine Gluconate Cloth  6 each Topical Daily  . dapsone  100 mg Oral Daily  . dolutegravir  50 mg Oral Daily  . feeding supplement (ENSURE ENLIVE)  237 mL Oral Q24H  . fentaNYL      . lamiVUDine  50 mg Oral Daily  . multivitamin with minerals  1 tablet Oral Daily  . mupirocin ointment  1 application Nasal BID  . oxyCODONE-acetaminophen      . pencillin G potassium IV  4 Million Units Intravenous Q8H  . sodium bicarbonate  1,300 mg Oral TID  . zidovudine  300 mg Oral Daily   Continuous Infusions:  PRN Meds:.acetaminophen, lidocaine, loperamide, ondansetron (ZOFRAN) IV, ondansetron, oxyCODONE-acetaminophen, phenol    Objective: Weight change: -4.2 oz (-0.12 kg)  Intake/Output Summary (Last 24 hours) at 01/12/16 2015 Last data filed at 01/12/16 1752  Gross per 24 hour  Intake             2280 ml  Output              750 ml  Net             1530 ml   Blood  pressure (!) 149/102, pulse 78, temperature 98.5 F (36.9 C), temperature source Oral, resp. rate (!) 24, height 6' (1.829 m), weight 174 lb 2.6 oz (79 kg), SpO2 98 %. Temp:  [97.8 F (36.6 C)-98.8 F (37.1 C)] 98.5 F (36.9 C) (08/14 1044) Pulse Rate:  [74-108] 78 (08/14 1044) Resp:  [20-24] 24 (08/14 0930) BP: (135-153)/(84-106) 149/102 (08/14 1044) SpO2:  [95 %-100 %] 98 % (08/14 1044)  Physical Exam: General: Alert and awake, oriented x 3 dysphoric and VERY poor eye contact CVS regular rate, normal r,  no murmur rubs or gallops Chest: clear to auscultation bilaterally, no wheezing, rales or rhonchi Abdomen: soft nontender, nondistended, normal bowel sounds, Extremities: no  clubbing or edema noted bilaterally Skin: no rashes Neuro: nonfocal  CBC: CBC Latest Ref Rng & Units 01/12/2016 01/11/2016 01/08/2016  WBC 4.0 - 10.5 K/uL 6.6 7.6 6.7  Hemoglobin 13.0 - 17.0 g/dL 8.2(L) 8.2(L) 8.4(L)  Hematocrit 39.0 -  52.0 % 24.8(L) 25.4(L) 25.6(L)  Platelets 150 - 400 K/uL 284 268 239      BMET  Recent Labs  01/11/16 0516 01/12/16 0535  NA 140 142  K 4.8 4.9  CL 111 117*  CO2 18* 16*  GLUCOSE 96 105*  BUN 74* 72*  CREATININE 9.25* 9.39*  CALCIUM 7.5* 7.6*     Liver Panel   Recent Labs  01/11/16 0516  ALBUMIN <1.0*       Sedimentation Rate No results for input(s): ESRSEDRATE in the last 72 hours. C-Reactive Protein No results for input(s): CRP in the last 72 hours.  Micro Results: Recent Results (from the past 720 hour(s))  Rapid strep screen     Status: None   Collection Time: 01/03/16  1:31 PM  Result Value Ref Range Status   Streptococcus, Group A Screen (Direct) NEGATIVE NEGATIVE Final    Comment: (NOTE) A Rapid Antigen test may result negative if the antigen level in the sample is below the detection level of this test. The FDA has not cleared this test as a stand-alone test therefore the rapid antigen negative result has reflexed to a Group A Strep  culture.   Culture, group A strep     Status: None   Collection Time: 01/03/16  1:31 PM  Result Value Ref Range Status   Specimen Description THROAT  Final   Special Requests NONE Reflexed from [id number]  Final   Culture FEW STREPTOCOCCUS,BETA HEMOLYTIC NOT GROUP A  Final   Report Status 01/05/2016 FINAL  Final  Culture, blood (Routine X 2) w Reflex to ID Panel     Status: None   Collection Time: 01/04/16 10:38 AM  Result Value Ref Range Status   Specimen Description BLOOD LEFT ANTECUBITAL  Final   Special Requests BOTTLES DRAWN AEROBIC ONLY 5CC  Final   Culture NO GROWTH 5 DAYS  Final   Report Status 01/09/2016 FINAL  Final  Culture, blood (Routine X 2) w Reflex to ID Panel     Status: None   Collection Time: 01/04/16 10:38 AM  Result Value Ref Range Status   Specimen Description BLOOD RIGHT ANTECUBITAL  Final   Special Requests BOTTLES DRAWN AEROBIC AND ANAEROBIC 6CC  Final   Culture NO GROWTH 5 DAYS  Final   Report Status 01/09/2016 FINAL  Final  OVA + PARASITE EXAM     Status: None   Collection Time: 01/04/16  2:33 PM  Result Value Ref Range Status   OVA + PARASITE EXAM Final report  Final    Comment: (NOTE) These results were obtained using wet preparation(s) and trichrome stained smear. This test does not include testing for Cryptosporidium parvum, Cyclospora, or Microsporidia. Performed At: [facility] [place], [place] [id number] Lindon Romp MD [id number]    Source of Sample STOOL  Final  Acid Fast Smear (AFB)     Status: None   Collection Time: 01/04/16  2:33 PM  Result Value Ref Range Status   AFB Specimen Processing Concentration  Final   Acid Fast Smear Negative  Final    Comment: (NOTE) Performed At: [facility] [place], [place] [id number] Lindon Romp MD [id number]    Source (AFB) STOOL  Final  C difficile quick scan w PCR reflex     Status: None   Collection Time: 01/04/16  3:18 PM  Result Value  Ref Range Status   C Diff antigen NEGATIVE NEGATIVE Final   C Diff toxin  NEGATIVE NEGATIVE Final   C Diff interpretation No C. difficile detected.  Final  Acid Fast Smear (AFB)     Status: None   Collection Time: 01/05/16  2:09 PM  Result Value Ref Range Status   AFB Specimen Processing Concentration  Final    Comment: (NOTE) Performed At: [facility] [place], [place] [id number] Lindon Romp MD [id number]    Acid Fast Smear NOSMR  Final    Comment: (NOTE) The acid-fast bacilli (AFB) smear will not be performed due to the specimen source (blood) or submission of an insufficient quantity of specimen.    Source (AFB) BLD  Final  Anaerobic culture     Status: None   Collection Time: 01/06/16  5:30 PM  Result Value Ref Range Status   Specimen Description CSF  Final   Special Requests NONE  Final   Culture NO ANAEROBES ISOLATED  Final   Report Status 01/12/2016 FINAL  Final  CSF culture     Status: None   Collection Time: 01/06/16  5:30 PM  Result Value Ref Range Status   Specimen Description CSF  Final   Special Requests NONE  Final   Gram Stain   Final    WBC PRESENT, PREDOMINANTLY MONONUCLEAR NO ORGANISMS SEEN CYTOSPIN    Culture NO GROWTH 3 DAYS  Final   Report Status 01/10/2016 FINAL  Final  Fungus Culture With Stain (Not @ [facility])     Status: None (Preliminary result)   Collection Time: 01/06/16  5:30 PM  Result Value Ref Range Status   Fungus Stain Final report  Final    Comment: (NOTE) Performed At: [facility] [place], [place] [id number] Lindon Romp MD VW:[id number]    Fungus (Mycology) Culture PENDING  Incomplete   Fungal Source CSF  Final  Fungus Culture Result     Status: None   Collection Time: 01/06/16  5:30 PM  Result Value Ref Range Status   Result 1 Comment  Final    Comment: (NOTE) KOH/Calcofluor preparation:  no fungus observed. Performed At: [facility] [place], [place] [id number] Lindon Romp MD ML:[id number]   Surgical pcr screen     Status: Abnormal   Collection Time: 01/12/16 12:52 AM  Result Value Ref Range Status   MRSA, PCR NEGATIVE NEGATIVE Final   Staphylococcus aureus POSITIVE (A) NEGATIVE Final    Comment:        The Xpert SA Assay (FDA approved for NASAL specimens in patients over 73 years of age), is one component of a comprehensive surveillance program.  Test performance has been validated by [facility] for patients greater than or equal to 47 year old. It is not intended to diagnose infection nor to guide or monitor treatment. RESULT CALLED TO, READ BACK BY AND VERIFIED WITH:  TO KGENGLER(RN) BY TCLEVELAND 01/12/2016 AT 4:43AM     Studies/Results: No results found.    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 male with untreated HIV disease,AIDS ( Lab Results  Component Value Date   CD4TABS 80 (L) 01/03/2016    with weight loss, fevers and now concern for HIVAN  Also with Neurosyphilis  #1  HIV AIDS with HIVAN: Started TIVICAY plus AZT and Epivir. These meds should bring down viral load rapidly. Since he is HLAB701 negative we can exchange the AZT for Abacavir but I will discuss with him first.  Emergency ADAP still has turn around of 48 hours or so so I am skeptical it will already be  approved. He may need help from [place] potentially to bridge him.  I agree with dapsone for PCP prophylaxis    #2 Neurosyphilis: Started penicillin and he will need 2 weeks intravenously with IV penicillin   #3 Depression: He is AGREEABLE TO PSYCHIATRY CONSULT. He contracts for safety and denies SI and HI  #4 ESRD approaching need for HD:  WHile I dislike the risk for infection with HD catheter, the more I think about it the more continue to have a  STRONG feeling this patient is VERY HIGH risk for not following up for his HIV or his CKD and that we should make things as simple and seamless for him as possible and as a part of this start his HD just as we are getting him plugged in for his HIV with starting meds.  I think he will do better starting on HD now and getting used to this inevitability. I think structure and support are paramount and keeping things as simple as possible so again I would go ahead and initiate HD. He is going to need to have his significant depression addressed as well.  I spent greater than 35  minutes with the patient including greater than 50% of time in face to face counsel of the patient re his depressive symptoms,nausea, ESRD now, HIV/AIDS, Neurosyphilis and in coordination of his care.    LOS: 9 days   Alcide Evener 01/12/2016, 8:15 PM

## 2016-01-12 NOTE — Interval H&P Note (Signed)
[facility] [place]  History and Physical Update  The patient was interviewed and re-examined.  The patient's previous History and Physical has been reviewed and is unchanged from my consult.  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   Risk, benefits, and alternatives to access surgery were discussed.    The patient is aware the risks include but are not limited to: bleeding, infection, steal syndrome, nerve damage, ischemic monomelic neuropathy, failure to mature, need for additional procedures, death and stroke.    The patient agrees to proceed forward with the procedure.   Adele Barthel, MD [facility] [place] Office: [phone] Pager: [phone]  01/12/2016, 7:23 AM

## 2016-01-13 ENCOUNTER — Telehealth: Payer: Self-pay | Admitting: Vascular Surgery

## 2016-01-13 ENCOUNTER — Encounter (HOSPITAL_COMMUNITY): Payer: Self-pay | Admitting: Vascular Surgery

## 2016-01-13 MED ORDER — AMLODIPINE BESYLATE 5 MG PO TABS: 5.0000 mg | ORAL_TABLET | Freq: Every day | ORAL | 2 refills | Status: DC

## 2016-01-13 MED ORDER — DOLUTEGRAVIR SODIUM 50 MG PO TABS: 50.0000 mg | ORAL_TABLET | Freq: Every day | ORAL | 4 refills | Status: DC

## 2016-01-13 MED ORDER — PENICILLIN G POTASSIUM 5000000 UNITS IJ SOLR: 4.0000 10*6.[IU] | Freq: Three times a day (TID) | INTRAVENOUS | 0 refills | Status: DC

## 2016-01-13 MED ORDER — LAMIVUDINE 150 MG PO TABS: 150.0000 mg | ORAL_TABLET | Freq: Two times a day (BID) | ORAL | 4 refills | Status: DC

## 2016-01-13 MED ORDER — SODIUM CHLORIDE 0.9% FLUSH: 10.0000 mL | Freq: Two times a day (BID) | INTRAVENOUS | Status: DC

## 2016-01-13 MED ORDER — ADULT MULTIVITAMIN W/MINERALS CH: 1.0000 | ORAL_TABLET | Freq: Every day | ORAL | 3 refills | Status: DC | PRN

## 2016-01-13 MED ORDER — DARBEPOETIN ALFA 100 MCG/0.5ML IJ SOSY
100.0000 ug | PREFILLED_SYRINGE | INTRAMUSCULAR | Status: DC
  Administered 2016-01-13: 100 ug via SUBCUTANEOUS
  Filled 2016-01-13 (×2): qty 0.5

## 2016-01-13 MED ORDER — SODIUM BICARBONATE 650 MG PO TABS: 1300.0000 mg | ORAL_TABLET | Freq: Three times a day (TID) | ORAL | 3 refills | Status: DC

## 2016-01-13 MED ORDER — DARBEPOETIN ALFA 100 MCG/0.5ML IJ SOSY: 100.0000 ug | PREFILLED_SYRINGE | INTRAMUSCULAR | 3 refills | Status: DC

## 2016-01-13 MED ORDER — ZIDOVUDINE 100 MG PO CAPS: 100.0000 mg | ORAL_CAPSULE | Freq: Two times a day (BID) | ORAL | 4 refills | Status: DC

## 2016-01-13 MED ORDER — ONDANSETRON 8 MG PO TBDP: 8.0000 mg | ORAL_TABLET | Freq: Three times a day (TID) | ORAL | 0 refills | Status: DC | PRN

## 2016-01-13 MED ORDER — LOPERAMIDE HCL 2 MG PO CAPS: 2.0000 mg | ORAL_CAPSULE | ORAL | 0 refills | Status: DC | PRN

## 2016-01-13 MED ORDER — DAPSONE 100 MG PO TABS: 100.0000 mg | ORAL_TABLET | Freq: Every day | ORAL | 3 refills | Status: DC

## 2016-01-13 MED ORDER — SODIUM CHLORIDE 0.9% FLUSH: 10.0000 mL | INTRAVENOUS | Status: DC | PRN

## 2016-01-13 MED ORDER — ALBUTEROL SULFATE (2.5 MG/3ML) 0.083% IN NEBU
2.5000 mg | INHALATION_SOLUTION | Freq: Once | RESPIRATORY_TRACT | Status: AC
  Administered 2016-01-13: 2.5 mg via RESPIRATORY_TRACT
  Filled 2016-01-13: qty 3

## 2016-01-13 MED ORDER — DARBEPOETIN ALFA 100 MCG/0.5ML IJ SOSY: 100.0000 ug | PREFILLED_SYRINGE | INTRAMUSCULAR | Status: DC

## 2016-01-13 MED ORDER — AMLODIPINE BESYLATE 5 MG PO TABS: 5.0000 mg | ORAL_TABLET | Freq: Every day | ORAL | Status: DC

## 2016-01-13 NOTE — Progress Notes (Signed)
 Subjective: No acute events overnight. Patient was nauseated and vomiting following surgery yesterday afternoon. He said he has not thrown up this morning and is feeling much better. Patient states that he is ready to go home. We discussed the placement of an HD catheter during his current hospital stay this morning. He said he would need to think about this over the morning. He had no additional acute complaints or concerns this morning.  Objective:  Vital signs in last 24 hours: Vitals:   01/12/16 1007 01/12/16 1044 01/12/16 2015 01/13/16 0540  BP: (!) 135/95 (!) 149/102 124/78 122/87  Pulse: 75 78 81 97  Resp:   20 20  Temp:  98.5 F (36.9 C) 98.6 F (37 C) 98.3 F (36.8 C)  TempSrc:  Oral Oral Oral  SpO2: 95% 98% 98% 97%  Weight:   187 lb 14.4 oz (85.2 kg)   Height:       Physical Exam  Constitutional: He is oriented to person, place, and time. He appears well-developed and well-nourished.  HENT:  Head: Normocephalic and atraumatic.  IJ in place  Cardiovascular: Normal rate and regular rhythm.  Exam reveals no gallop and no friction rub.   No murmur heard. Respiratory: Effort normal. No respiratory distress. He has wheezes.  Bilateral wheezes in all lung fields  GI: Soft. Bowel sounds are normal. He exhibits no distension. There is tenderness.  Musculoskeletal: He exhibits no edema.  Neurological: He is alert and oriented to person, place, and time.     Assessment/Plan: Mr. Doane is a 30-year-old male with AIDS (CD4 count 80) not on ART ( started ART on 01/07/2016)therapywho presents with a 4-5 week history of increased fatigue, cough and diarrhea found to have renal failure and neurosyphilis based on lumbar puncture.  In brief, patient receiving IV penicillin for neurosyphilis. Patient started on antiretroviral therapy and dapsone prophylaxis. Patient with a right tunneled IJ catheter. Had  first stage basilic vein transposition on 01/13/2016. We will discuss with the  patient if he is willing to have an HD catheter placed before discharge.    Assessment & Plan by Problem: 1. 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 -- FeNagreater than 2% indicating intrinsic renal pathology -- Hep B and Hep C antibodies- both negative -- Oral sodium bicarbonate per renal recommendation, will continue 1300 mg 3 times a day  2. AIDS -- Patient states he was diagnosed with HIV 2-3 years ago and was never taking any antiretroviral therapy. -- He has not followed up with a doctor for this in the past. -- CD4 count 80 -- Patient started on TIVICAY plus AZT and Epivir. -- Pending results of HLA B701 we'll exchange AZT for abacavir -- Started on dapsone 100 mg once daily for PJP prophylaxis -- Cryptococcal antigen (blood) negative -- RPR (non-treponemal) and Fluorescent treponemal (treponemal) positive   3. Neurosyphilis  -- Start IV penicillin for treatment of neurosyphilis, patient will need this for approximately 2 weeks, this will be dosed by pharmacy -- Tunneled IJ catheter placed by interventional radiology on 01/09/2016 -- Continue IV penicillin  4. Diarrhea  -- Improving -- Loperamide 2 mg as needed for diarrhea  5. Chronic Cough -- AIDS (CD4 count 80) not on ARTcomplains of cough of one year duration with greenish sputum production. Given his HIV status differential diagnosis for his chronic cough is extremely broad but does include atypical organism   such as PCP pneumonia. Results of the HIV viral load and CD4 cell count will help differentiating potential infectious etiologies. -- We will walk the patient with O2 saturation today -- Consider starting prophylactic medication based on patient's CD4 count for PJP pneumonia -- Chest x-ray normal -- No leukocytosis most recent white cell count of 7.6 -- Quantiferon tb Gold  Assay -- O2 sats at rest 95% O2 sat with ambulation 89-92%  Dispo: Anticipated discharge in approximately 1-2 day(s).   Ophelia Shoulder, MD 01/13/2016, 8:38 AM Pager: [phone]

## 2016-01-13 NOTE — Telephone Encounter (Signed)
-----   Message from Mena Goes, RN sent at 01/12/2016 12:23 PM EDT ----- Regarding: 6 weeks    ----- Message ----- From: Conrad Sumner, MD Sent: 01/12/2016   9:16 AM To: Vvs Charge [address]  Gianno Volner [phone] 15-Jul-1985  PROCEDURE: left first stage basilic vein transposition (brachiobasilic arteriovenous fistula) placement  Asst: Gerri Lins, [facility]   Follow-up: 6 weeks

## 2016-01-13 NOTE — Progress Notes (Signed)
Patient being discharged home with friend. IV removed. Match letter given, prescriptions reviewed. Home Health Infusion Nurse to visit patient this evening. All follow-up appts reviewed with patient. Belongings in tow. Patient states he understands plan of care.

## 2016-01-13 NOTE — Care Management Note (Addendum)
Case Management Note  Patient Details  Name: Jonathon Moore MRN: [id number] Date of Birth: March 05, 1986  Subjective/Objective:         CM following for progression and d/c planning.            Action/Plan: 01/13/2016 MATCH prepared for this pt in ED at time of admission. Pt referral to [facility] made by ED case manager, Bubba Hales.  [facility] to provide IV antibiotics for this pt , RN rep, Lelan Pons, met with pt and explained process for IV antibiotics and qualify pt for [facility] services.  All info given to pt by d/c RN. Pt has followup appointments.  Some concern for pt compliance as pt gives impression of being disinterested.  Hopefully this pt will follow up with appointments and complete IV antibiotic course as prescribed.  Expected Discharge Date:     01/13/2016             Expected Discharge Plan:  [place]  In-House Referral:  Clinical Social Work  Discharge planning Services  CM Consult, [place] [facility], [facility]  Post Acute Care Choice:  Home Health Choice offered to:  NA  DME Arranged:  IV pump/equipment DME Agency:  [place]:  RN [facility] Agency:  [place]  Status of Service:  Completed, signed off  If discussed at [place] of Stay Meetings, dates discussed:    Additional Comments:  Adron Bene, RN 01/13/2016, 3:37 PM

## 2016-01-13 NOTE — Discharge Summary (Signed)
Name: Jonathon Moore MRN: [id number] DOB: 03-Oct-1985 30 y.o. 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 Diarrhea    Discharge Medications:   Medication List    STOP taking these medications   ibuprofen 200 MG tablet Commonly known as:  ADVIL,MOTRIN     TAKE these medications   amLODipine 5 MG tablet Commonly known as:  NORVASC Take 1 tablet (5 mg total) by mouth at bedtime.   dapsone 100 MG tablet Take 1 tablet (100 mg total) by mouth daily.   Darbepoetin Alfa 100 MCG/0.5ML Sosy injection Commonly known as:  ARANESP Inject 0.5 mLs (100 mcg total) into the skin every Tuesday.   dolutegravir 50 MG tablet Commonly known as:  TIVICAY Take 1 tablet (50 mg total) by mouth daily.   lamiVUDine 150 MG tablet Commonly known as:  EPIVIR Take 1 tablet (150 mg total) by mouth 2 (two) times daily.   loperamide 2 MG capsule Commonly known as:  IMODIUM Take 1 capsule (2 mg total) by mouth as needed for diarrhea or loose stools.   multivitamin with minerals Tabs tablet Take 1 tablet by mouth daily as needed (diarrhea).   ondansetron 8 MG disintegrating tablet Commonly known as:  ZOFRAN-ODT Take 1 tablet (8 mg total) by mouth every 8 (eight) hours as needed for nausea or vomiting.   penicillin G potassium 4 Million Units in dextrose 5 % 250 mL Inject 4 Million Units into the vein every 8 (eight) hours.   sodium bicarbonate 650 MG tablet Take 2 tablets (1,300 mg total) by mouth 3 (three) times daily.   zidovudine 100 MG capsule Commonly known as:  RETROVIR Take 1 capsule (100 mg total) by mouth 2 (two) times daily.       Disposition and follow-up:   JonathonJonathon Moore was discharged from [facility] in [place] condition.  At the hospital follow up visit please  address:  1.  Please ensure the patient is taking His antiretroviral medications. Additionally, please ensure the patient is finishing his course of IV penicillin for neurosyphilis. Please ensure the patient goes to follow-up with the nephrologist regarding future dialysis.  2.  Labs / imaging needed at time of follow-up: Please follow-up Jonathon Moore HIV viral load  3.  Pending labs/ test needing follow-up: There are no pending labs/tests needed for follow-up for Jonathon Moore hospitalization.  Follow-up Appointments: Follow-up Information    [facility]             . Call today.   Why:  follow up with Orland Mustard RN Coordinator ID Clinic [facility] for medication assistance [phone].    Contact information: [address] [place] [address]       Adele Barthel, MD Follow up in 6 week(s).   Specialties:  Vascular Surgery, Cardiology Why:  Office will call you to arrange your appt (sent) Contact information: [place] [address] [phone]        Sherril Croon, MD. Go on 01/20/2016.   Specialty:  Nephrology Why:  Arrive for appointment at 3:45pm.   Contact information: [place] [address] [place] by problem list:  1. HIV-associated nephropathy Jonathon Moore presented to the [facility]  emergency department on 01/03/2016 complaining of 3-4 week history of diarrhea. Lab work in the emergency department demonstrated acute renal failure with a BUN of 57 and creatinine of 8.09. A FeNa was calculated which was greater than 2% indicating intrinsic renal pathology. Jonathon Moore underwent a renal biopsy on 8/7 which demonstrated a collapsing variant of focal segmental glomerulosclerosis which is consistent with HIV-associated nephropathy. Additionally, the patient had a metabolic acidosis with his bicarbonate consistently around 17 during his admission. He was treated  for this with oral sodium bicarbonate at 1300 mg 3 times daily. He was seen by nephrology who have discussed with the patient that he will need dialysis in the future. He will be discharged with follow-up with Dr. Justin Mend in nephrology clinic for monitoring of his renal failure. Additionally, while inpatient the patient underwent a first stage basilic vein transposition on 01/13/2016 in preparation for dialysis in the future. This procedure went without complication and there was no evidence of steal syndrome following the procedure. The patient's renal function did not improve during the course of his hospitalization. He is discharged with close follow-up with nephrology and he will be seen in their clinic as an outpatient. At the time of discharge he was having only mild uremic symptoms and it was discussed that he would not need dialysis at this time.  2. AIDS Patient was diagnosed with HIV infection 2-3 years prior to his hospitalization. He admits that he never followed up with these results and had never been on antiretroviral therapy. At the time of his admission his CD4 count was 80. His HIV viral load is still pending. Along with diarrhea the patient was complaining of daily headaches. In order to rule out the potential of cryptococcal meningitis a lumbar puncture was performed. Lumbar puncture was negative for cryptococcal antigen but had a CSF profile consistent with neurosyphilis. He was not initially started on antiretroviral therapy for fear of immune reconstitution inflammatory syndrome. After cryptococcal meningitis was ruled out with lumbar puncture the patient was appropriately started on antiretroviral therapy. The patient was started on  TIVICAY, AZT and Epivir. He was started on dapsone 100 mg once daily for PJP prophylaxis. Normally, trimethoprim-sulfamethoxazole would be the treatment of choice for prophylaxis against opportunistic infections however given the patient's renal function dapsone  is the most appropriate medication. He will be discharged with close follow-up in ID clinic and has arranged for medications through [facility].  3. Neurosyphilis He complained of daily headaches on admission. Before starting the patient on antiretroviral therapy a lumbar puncture was performed to rule out cryptococcal meningitis for fear of IRIS. The patients CSF was negative for cryptococcal antigen but had a profile consistent with neurosyphilis. The patient was then started on IV penicillin which he will need a two-week course of. To facilitate this at home a right tunneled IJ catheter was inserted. IV penicillin therapy was started on 08/09 and the antibiotic stop date will be 08/23. Patient will continue IV penicillin at home and has been set up with home health. Additionally, the patient will have access to penicillin as this was set up with our social workers and case management. He will have outpatient follow-up with infectious disease clinic.  4. Diarrhea Mr. Pullara chief complaint at presentation was a 4-5 week history of diarrhea. The patient continued to have diarrhea while on our service and had an extensive infectious workup. He was negative for Clostridium difficile infection. His diarrhea improved over the course of admission but he is still  having minor diarrhea at time of discharge. An exact etiology was never identified but this may be secondary to uncontrolled HIV infection. Please continue to monitor this in the outpatient setting.  Discharge Vitals:   BP (!) 151/85 (BP Location: Right Arm)   Pulse 93   Temp 98.1 F (36.7 C) (Oral)   Resp 18   Ht 6' (1.829 m) Comment: from previous admission  Wt 185 lb 6.4 oz (84.1 kg)   SpO2 98%   BMI 25.14 kg/m   Pertinent Labs, Studies, and Procedures:  --CD4 count 80 --Renal biopsy demonstrating collapsing variant of focal segmental glomerulosclerosis consistent with HIV-associated nephropathy --Lumbar puncture with cell count and  differential consistent with neurosyphilis -- Right tunneled IJ catheter placed for IV penicillin access -- First stage basilic vein transposition on 01/13/2016 for dialysis  Discharge Instructions: Discharge Instructions    Diet - low sodium heart healthy    Complete by:  As directed   Discharge instructions    Complete by:  As directed   Please make sure you follow up with infectious disease and kidney doctors. It's VERY important. Please take ALL of your medications. You have to complete the course of the penicillin until 01/20/16.   Increase activity slowly    Complete by:  As directed      Signed: Ophelia Shoulder, MD 01/13/2016, 1:47 PM   Pager: [phone]

## 2016-01-13 NOTE — Telephone Encounter (Signed)
Sched appt 9/29 at 10:30. Lm on hm# to inform pt of appt.

## 2016-01-13 NOTE — Progress Notes (Signed)
Subjective:  Nausea is better today he says  Antibiotics:  Anti-infectives    Start     Dose/Rate Route Frequency Ordered Stop   01/13/16 0000  dapsone 100 MG tablet  Status:  Discontinued     100 mg Oral Daily 01/13/16 1148 01/13/16    01/13/16 0000  dapsone 100 MG tablet     100 mg Oral Daily 01/13/16 1256     01/13/16 0000  dolutegravir (TIVICAY) 50 MG tablet     50 mg Oral Daily 01/13/16 1256     01/13/16 0000  lamiVUDine (EPIVIR) 150 MG tablet     150 mg Oral 2 times daily 01/13/16 1256     01/13/16 0000  penicillin G potassium 4 Million Units in dextrose 5 % 250 mL     4 Million Units 250 mL/hr over 60 Minutes Intravenous Every 8 hours 01/13/16 1256     01/13/16 0000  zidovudine (RETROVIR) 100 MG capsule     100 mg Oral 2 times daily 01/13/16 1256     01/12/16 0715  cefUROXime (ZINACEF) 1.5 g in dextrose 5 % 50 mL IVPB     1.5 g 100 mL/hr over 30 Minutes Intravenous To [facility] 01/11/16 1310 01/12/16 0748   01/09/16 1000  dapsone tablet 100 mg     100 mg Oral Daily 01/09/16 0821     01/08/16 1000  lamiVUDine (EPIVIR) 10 MG/ML solution 50 mg     50 mg Oral Daily 01/07/16 1636     01/07/16 1700  dolutegravir (TIVICAY) tablet 50 mg     50 mg Oral Daily 01/07/16 1636     01/07/16 1700  zidovudine (RETROVIR) capsule 300 mg     300 mg Oral Daily 01/07/16 1636     01/07/16 1700  lamiVUDine (EPIVIR) tablet 150 mg     150 mg Oral Once 01/07/16 1636 01/07/16 1831   01/07/16 0915  penicillin G potassium 4 Million Units in dextrose 5 % 250 mL IVPB     4 Million Units 250 mL/hr over 60 Minutes Intravenous Every 8 hours 01/07/16 0855        Medications: Scheduled Meds: . amLODipine  5 mg Oral QHS  . Chlorhexidine Gluconate Cloth  6 each Topical Daily  . dapsone  100 mg Oral Daily  . darbepoetin (ARANESP) injection - NON-DIALYSIS  100 mcg Subcutaneous Q Tue  . dolutegravir  50 mg Oral Daily  . feeding supplement (ENSURE ENLIVE)  237 mL Oral Q24H  .  lamiVUDine  50 mg Oral Daily  . multivitamin with minerals  1 tablet Oral Daily  . mupirocin ointment  1 application Nasal BID  . pencillin G potassium IV  4 Million Units Intravenous Q8H  . sodium bicarbonate  1,300 mg Oral TID  . sodium chloride flush  10-40 mL Intracatheter Q12H  . zidovudine  300 mg Oral Daily   Continuous Infusions:  PRN Meds:.acetaminophen, lidocaine, loperamide, ondansetron (ZOFRAN) IV, ondansetron, oxyCODONE-acetaminophen, phenol, sodium chloride flush    Objective: Weight change: 13 lb 11.8 oz (6.231 kg)  Intake/Output Summary (Last 24 hours) at 01/13/16 1301 Last data filed at 01/13/16 0845  Gross per 24 hour  Intake              480 ml  Output              425 ml  Net  55 ml   Blood pressure (!) 151/85, pulse 93, temperature 98.1 F (36.7 C), temperature source Oral, resp. rate 18, height 6' (1.829 m), weight 185 lb 6.4 oz (84.1 kg), SpO2 98 %. Temp:  [98.1 F (36.7 C)-98.6 F (37 C)] 98.1 F (36.7 C) (08/15 0845) Pulse Rate:  [81-97] 93 (08/15 0845) Resp:  [18-20] 18 (08/15 0845) BP: (122-151)/(78-87) 151/85 (08/15 0845) SpO2:  [97 %-98 %] 98 % (08/15 0845) Weight:  [185 lb 6.4 oz (84.1 kg)-187 lb 14.4 oz (85.2 kg)] 185 lb 6.4 oz (84.1 kg) (08/15 1233)  Physical Exam: General: Alert and awake, oriented x 3 Less dysphoric today and better interactions CVS regular rate, normal r,  no murmur rubs or gallops Chest: clear to auscultation bilaterally, no wheezing, rales or rhonchi Abdomen: soft nontender, nondistended, normal bowel sounds, Extremities: no  clubbing or edema noted bilaterally Skin: no rashes Neuro: nonfocal  CBC: CBC Latest Ref Rng & Units 01/12/2016 01/11/2016 01/08/2016  WBC 4.0 - 10.5 K/uL 6.6 7.6 6.7  Hemoglobin 13.0 - 17.0 g/dL 8.2(L) 8.2(L) 8.4(L)  Hematocrit 39.0 - 52.0 % 24.8(L) 25.4(L) 25.6(L)  Platelets 150 - 400 K/uL 284 268 239      BMET  Recent Labs  01/11/16 0516 01/12/16 0535  NA 140 142  K  4.8 4.9  CL 111 117*  CO2 18* 16*  GLUCOSE 96 105*  BUN 74* 72*  CREATININE 9.25* 9.39*  CALCIUM 7.5* 7.6*     Liver Panel   Recent Labs  01/11/16 0516  ALBUMIN <1.0*       Sedimentation Rate No results for input(s): ESRSEDRATE in the last 72 hours. C-Reactive Protein No results for input(s): CRP in the last 72 hours.  Micro Results: Recent Results (from the past 720 hour(s))  Rapid strep screen     Status: None   Collection Time: 01/03/16  1:31 PM  Result Value Ref Range Status   Streptococcus, Group A Screen (Direct) NEGATIVE NEGATIVE Final    Comment: (NOTE) A Rapid Antigen test may result negative if the antigen level in the sample is below the detection level of this test. The FDA has not cleared this test as a stand-alone test therefore the rapid antigen negative result has reflexed to a Group A Strep culture.   Culture, group A strep     Status: None   Collection Time: 01/03/16  1:31 PM  Result Value Ref Range Status   Specimen Description THROAT  Final   Special Requests NONE Reflexed from [id number]  Final   Culture FEW STREPTOCOCCUS,BETA HEMOLYTIC NOT GROUP A  Final   Report Status 01/05/2016 FINAL  Final  Culture, blood (Routine X 2) w Reflex to ID Panel     Status: None   Collection Time: 01/04/16 10:38 AM  Result Value Ref Range Status   Specimen Description BLOOD LEFT ANTECUBITAL  Final   Special Requests BOTTLES DRAWN AEROBIC ONLY 5CC  Final   Culture NO GROWTH 5 DAYS  Final   Report Status 01/09/2016 FINAL  Final  Culture, blood (Routine X 2) w Reflex to ID Panel     Status: None   Collection Time: 01/04/16 10:38 AM  Result Value Ref Range Status   Specimen Description BLOOD RIGHT ANTECUBITAL  Final   Special Requests BOTTLES DRAWN AEROBIC AND ANAEROBIC 6CC  Final   Culture NO GROWTH 5 DAYS  Final   Report Status 01/09/2016 FINAL  Final  OVA + PARASITE EXAM     Status: None  Collection Time: 01/04/16  2:33 PM  Result Value Ref Range Status    OVA + PARASITE EXAM Final report  Final    Comment: (NOTE) These results were obtained using wet preparation(s) and trichrome stained smear. This test does not include testing for Cryptosporidium parvum, Cyclospora, or Microsporidia. Performed At: [facility] [place], [place] [id number] Lindon Romp MD [id number]    Source of Sample STOOL  Final  Acid Fast Smear (AFB)     Status: None   Collection Time: 01/04/16  2:33 PM  Result Value Ref Range Status   AFB Specimen Processing Concentration  Final   Acid Fast Smear Negative  Final    Comment: (NOTE) Performed At: [facility] [place], [place] [id number] Lindon Romp MD PR:[id number]    Source (AFB) STOOL  Final  C difficile quick scan w PCR reflex     Status: None   Collection Time: 01/04/16  3:18 PM  Result Value Ref Range Status   C Diff antigen NEGATIVE NEGATIVE Final   C Diff toxin NEGATIVE NEGATIVE Final   C Diff interpretation No C. difficile detected.  Final  Acid Fast Smear (AFB)     Status: None   Collection Time: 01/05/16  2:09 PM  Result Value Ref Range Status   AFB Specimen Processing Concentration  Final    Comment: (NOTE) Performed At: [facility] [place], [place] [id number] Lindon Romp MD TJ:[id number]    Acid Fast Smear NOSMR  Final    Comment: (NOTE) The acid-fast bacilli (AFB) smear will not be performed due to the specimen source (blood) or submission of an insufficient quantity of specimen.    Source (AFB) BLD  Final  Anaerobic culture     Status: None   Collection Time: 01/06/16  5:30 PM  Result Value Ref Range Status   Specimen Description CSF  Final   Special Requests NONE  Final   Culture NO ANAEROBES ISOLATED  Final   Report Status 01/12/2016 FINAL  Final  CSF culture     Status: None   Collection Time: 01/06/16  5:30 PM  Result Value Ref Range Status   Specimen Description CSF  Final   Special Requests  NONE  Final   Gram Stain   Final    WBC PRESENT, PREDOMINANTLY MONONUCLEAR NO ORGANISMS SEEN CYTOSPIN    Culture NO GROWTH 3 DAYS  Final   Report Status 01/10/2016 FINAL  Final  Fungus Culture With Stain (Not @ [facility])     Status: None (Preliminary result)   Collection Time: 01/06/16  5:30 PM  Result Value Ref Range Status   Fungus Stain Final report  Final    Comment: (NOTE) Performed At: [facility] [place], [place] [id number] Lindon Romp MD KT:[id number]    Fungus (Mycology) Culture PENDING  Incomplete   Fungal Source CSF  Final  Fungus Culture Result     Status: None   Collection Time: 01/06/16  5:30 PM  Result Value Ref Range Status   Result 1 Comment  Final    Comment: (NOTE) KOH/Calcofluor preparation:  no fungus observed. Performed At: [facility] [place], [place] [id number] Lindon Romp MD BW:[id number]   Surgical pcr screen     Status: Abnormal   Collection Time: 01/12/16 12:52 AM  Result Value Ref Range Status   MRSA, PCR NEGATIVE NEGATIVE Final   Staphylococcus aureus POSITIVE (A) NEGATIVE Final  Comment:        The Xpert SA Assay (FDA approved for NASAL specimens in patients over 39 years of age), is one component of a comprehensive surveillance program.  Test performance has been validated by [facility] for patients greater than or equal to 16 year old. It is not intended to diagnose infection nor to guide or monitor treatment. RESULT CALLED TO, READ BACK BY AND VERIFIED WITH:  TO KGENGLER(RN) BY TCLEVELAND 01/12/2016 AT 4:43AM     Studies/Results: No results found.    Assessment/Plan:  INTERVAL HISTORY:  Pt sp  IR guided biopsy kidney  RPR +, crypto ag serum negative  LP with normal opening pressure but HIGH WBC and VDRL + of CSF c/w Neurosyphilis  PCN started, bx back and c/w HIVAN  Patient states he is tolerating his ARV  Sp VVS    Active Problems:   Metabolic  acidosis   Diarrhea   DOE (dyspnea on exertion)   Normocytic anemia   Unintentional weight loss   Poor dentition   AKI (acute kidney injury) ([place])   AIDS (acquired immune deficiency syndrome) ([place])   HIVAN (HIV-associated nephropathy) ([place])   Depression   Headache   Neurosyphilis   Cough   Wheezing    Jonathon Moore is a 30 y.o. male with untreated HIV disease,AIDS ( Lab Results  Component Value Date   CD4TABS 80 (L) 01/03/2016    with weight loss, fevers and now concern for HIVAN  Also with Neurosyphilis  #1  HIV AIDS with HIVAN: Started TIVICAY plus AZT and Epivir. These meds should bring down viral load rapidly. Since he is HLAB701 negative we can exchange the AZT for Abacavir but I will discuss with him first.  I would expect his emergency ADAP has been processed by now.  Please send his ARV and PCP prophylaxis meds (and all meds possible) to [place] and ensure that they are ready for him to pickup prior to DC  I agree with dapsone for PCP prophylaxis   #2 Neurosyphilis: Started penicillin and he will need 2 weeks intravenously with IV penicillin   #3 Depression: He is AGREEABLE TO PSYCHIATRY CONSULT. He contracts for safety and denies SI and HI  #4 ESRD approaching need for HD: sp VVS, Nephrology think he can get through next few months without need for HD   I will arrange HSFU for the patient in our clinic.  Otherwise I will sign off   Please call with further questions.    LOS: 10 days   Alcide Evener 01/13/2016, 1:01 PM

## 2016-01-13 NOTE — Progress Notes (Signed)
[facility] Rounding Note Subjective: Seen and examined this morning. Doing well post-op.  Minimal pain in arm.  Objective: BP (!) 151/85 (BP Location: Right Arm)   Pulse 93   Temp 98.1 F (36.7 C) (Oral)   Resp 18   Ht 6' (1.829 m) Comment: from previous admission  Wt 187 lb 14.4 oz (85.2 kg)   SpO2 98%   BMI 25.48 kg/m    Intake/Output Summary (Last 24 hours) at 01/13/16 1148 Last data filed at 01/13/16 0845  Gross per 24 hour  Intake              480 ml  Output              425 ml  Net               55 ml    Weight change: 13 lb 11.8 oz (6.231 kg)  EXAM: General: lying in bed, the most conversant he has been. Cardiac: RRR Lungs: normal effort Ext: warm and well perfused, no pedal edema, warm hands, intact pulse Neuro: alert and oriented Psych: reserved, pleasant, smiled  Labs: Basic Metabolic Panel:  Recent Labs Lab 01/07/16 0501 01/08/16 0330 01/11/16 0516 01/12/16 0535  NA 138 137 140 142  K 4.4 4.3 4.8 4.9  CL 113* 114* 111 117*  CO2 16* 17* 18* 16*  GLUCOSE 91 93 96 105*  BUN 68* 71* 74* 72*  CREATININE 7.88* 8.17* 9.25* 9.39*  CALCIUM 7.2* 7.1* 7.5* 7.6*  PHOS 4.3 5.1* 4.3  --     Liver Function Tests:  Recent Labs Lab 01/07/16 0501 01/08/16 0330 01/11/16 0516  ALBUMIN <1.0* <1.0* <1.0*   No results for input(s): LIPASE, AMYLASE in the last 168 hours. CBC:  Recent Labs Lab 01/08/16 0330 01/11/16 0516 01/12/16 0535  WBC 6.7 7.6 6.6  HGB 8.4* 8.2* 8.2*  HCT 25.6* 25.4* 24.8*  MCV 88.0 88.8 87.9  PLT 239 268 284    Cardiac Enzymes: No results for input(s): CKTOTAL, CKMB, CKMBINDEX, TROPONINI in the last 168 hours.  Studies/Results: No results found. Background: 30 y.o. year-old [place] man with PMH of bronchitis and HIV diagnosed in 2014 which has never been treated. 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 In the ED he was found to have a creatinine of 8.09, bicarb of 16, serum albumin <1, UA with >300 protein, 6-30 RBC's. Renal ultrasound ->12.5, 12.7 cm kidneys, echodense.  He was given a liter of NS in the ED and then admitted for further evaluation. Labs that have returned so far->neg Hep B,C, ESR 140. ANCA, ANA, complements, cryoglobulins are all pending. HIV-1 RNA also pending. We are asked to see.  Assessment/Recommendations 1.  Renal Failure: with proteinuria and hematuria.   Normal renal function in Oct 2016 with creatinine of 1.08.  He presents with creatinine of 8 and proteinuria/microscopic hematuria (UPC could not be calculated d/t protein concentration ">3000") in the setting of untreated HIV.   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 - Had left first stage basilic vein transposition (brachiobasilic arteriovenous fistula) placement 8/14 with Dr. Bridgett Larsson. - Renal function no better, but the hope is that he can limp through the next couple months  without HD until his AV fistula matures to avoid placing a temporary catheter.   - BP is a little high so will start low dose amlodipine 28m daily. - Acidosis as below (see #2).  Continue bicarb supplementation. - check PTH. - given anemia of CKD, will give dose of Aranesp 1069m weekly.  Dosed today. - has appt with Dr. WeJustin Mendext week. Appt info added to discharge instructions.  2.  Metabolic acidosis in setting of renal failure - on oral sodium bicarb started by primary service.  3. Neurosyphilis - Started on 2 weeks IV Penicillin course  4.  Anemia: likely due to CKD - Ferritin 839, Iron 46, Saturation ratio 43% - Aranesp dosed today.  5. HIV - Since 2014. Untreated. Family is not aware of his diagnosis. - started on ART by ID - On Dapsone for PJP prophylaxis  6. Diarrhea,  weight loss - improved  7.  Fevers - resolved  8.  Depressed mood - appears better today than he has.  Will likely benefit going forward from further support whether from professionals, family, or peers.   AnJule SerIMTS PGY2 11:48 AM

## 2016-01-13 NOTE — Progress Notes (Signed)
   Daily Progress Note  Assessment/Planning: POD #1 s/p L 1st stage BVT   No signs of steal syndrome  Follow up in the office in 6 weeks [phone]   Subjective  - 1 Day Post-Op  No complaints  Objective Vitals:   01/12/16 1007 01/12/16 1044 01/12/16 2015 01/13/16 0540  BP: (!) 135/95 (!) 149/102 124/78 122/87  Pulse: 75 78 81 97  Resp:   20 20  Temp:  98.5 F (36.9 C) 98.6 F (37 C) 98.3 F (36.8 C)  TempSrc:  Oral Oral Oral  SpO2: 95% 98% 98% 97%  Weight:   187 lb 14.4 oz (85.2 kg)   Height:        Intake/Output Summary (Last 24 hours) at 01/13/16 0748 Last data filed at 01/13/16 0540  Gross per 24 hour  Intake             1100 ml  Output              475 ml  Net              625 ml    VASC  Intact hand grip, warm hand, inc c/d/i, +bruit, +thrill  Laboratory CBC    Component Value Date/Time   WBC 6.6 01/12/2016 0535   HGB 8.2 (L) 01/12/2016 0535   HCT 24.8 (L) 01/12/2016 0535   PLT 284 01/12/2016 0535    BMET    Component Value Date/Time   NA 142 01/12/2016 0535   K 4.9 01/12/2016 0535   CL 117 (H) 01/12/2016 0535   CO2 16 (L) 01/12/2016 0535   GLUCOSE 105 (H) 01/12/2016 0535   BUN 72 (H) 01/12/2016 0535   CREATININE 9.39 (H) 01/12/2016 0535   CALCIUM 7.6 (L) 01/12/2016 0535   GFRNONAA 7 (L) 01/12/2016 0535   GFRAA 8 (L) 01/12/2016 0535    Adele Barthel, MD [facility] Office: [phone] Pager: [phone]  01/13/2016, 7:48 AM

## 2016-01-14 LAB — PARATHYROID HORMONE, INTACT (NO CA): PTH: 93 pg/mL — AB (ref 15–65)

## 2016-01-14 SURGERY — TRANSPOSITION, VEIN, BASILIC
Anesthesia: General | Laterality: Left

## 2016-01-15 LAB — REFLEX TO GENOSURE(R) MG

## 2016-01-15 LAB — HIV-1 RNA ULTRAQUANT REFLEX TO GENTYP+
HIV-1 RNA BY PCR: 205000 copies/mL
HIV-1 RNA QUANT, LOG: 5.312 {Log_copies}/mL

## 2016-01-20 ENCOUNTER — Encounter (HOSPITAL_COMMUNITY): Payer: Self-pay

## 2016-01-21 ENCOUNTER — Encounter: Payer: Self-pay | Admitting: Infectious Disease

## 2016-01-21 ENCOUNTER — Ambulatory Visit: Payer: Self-pay | Admitting: Pharmacist

## 2016-01-21 ENCOUNTER — Telehealth: Payer: Self-pay | Admitting: Pharmacist

## 2016-01-21 DIAGNOSIS — B20 Human immunodeficiency virus [HIV] disease: Secondary | ICD-10-CM

## 2016-01-21 MED ORDER — SULFAMETHOXAZOLE-TRIMETHOPRIM 800-160 MG PO TABS: 1.0000 | ORAL_TABLET | Freq: Every day | ORAL | 5 refills | Status: DC

## 2016-01-21 MED ORDER — ABACAVIR SULFATE 300 MG PO TABS: 600.0000 mg | ORAL_TABLET | Freq: Every day | ORAL | 6 refills | Status: DC

## 2016-01-21 MED ORDER — LAMIVUDINE 10 MG/ML PO SOLN: 50.0000 mg | Freq: Two times a day (BID) | ORAL | 5 refills | Status: DC

## 2016-01-21 MED ORDER — LAMIVUDINE 5 MG/ML PO SOLN: 50.0000 mg | Freq: Every day | ORAL | 6 refills | Status: DC

## 2016-01-21 MED ORDER — LAMIVUDINE 10 MG/ML PO SOLN: 50.0000 mg | Freq: Every day | ORAL | 5 refills | Status: DC

## 2016-01-21 NOTE — Progress Notes (Signed)
HPI: Jonathon Moore is a 30 y.o. male who presents to the [facility] clinic today for hospital follow up.  He was recently discharged from the hospital last Tuesday.  He was diagnosed with neurosyphilis and just finished his 2 week course of PCN today. He is also HIV positive - diagnosed several years ago - never on medications. He now has progressive renal failure (SCr 9.39) and had a fistula placed while in the hospital in preparation for dialysis.  He comes to see me today for medication follow up. His legs are quite swollen due to fluid build up.  Allergies: No Known Allergies  Past Medical History: Past Medical History:  Diagnosis Date  . Bronchitis   . Hemorrhoids   . HIV (human immunodeficiency virus infection) ([place])     Social History: Social History   Social History  . Marital status: Single    Spouse name: N/A  . Number of children: N/A  . Years of education: N/A   Social History Main Topics  . Smoking status: Current Every Day Smoker    Packs/day: 0.20  . Smokeless tobacco: Never Used  . Alcohol use Yes     Comment: weekly  . Drug use:     Types: Marijuana  . Sexual activity: Not on file   Other Topics Concern  . Not on file   Social History Narrative  . No narrative on file    Current Regimen: Retrovir 100 BID, Epivir 150 mg BID, Tivicay 50 daily  Labs: CD4 T Cell Abs (/uL)  Date Value  01/03/2016 80 (L)   Hepatitis B Surface Ag (no units)  Date Value  01/03/2016 Negative    CrCl: Estimated Creatinine Clearance: 12.9 mL/min (by C-G formula based on SCr of 9.39 mg/dL).  Lipids:    Component Value Date/Time   CHOL 174 01/03/2016 1549   TRIG 159 (H) 01/03/2016 1549   HDL 21 (L) 01/03/2016 1549   CHOLHDL 8.3 01/03/2016 1549   VLDL 32 01/03/2016 1549   LDLCALC 121 (H) 01/03/2016 1549    Assessment: Jonathon Moore is here for hospital follow up.  He is doing ok since being out of the hospital.  He is quite fluid overloaded, especially in  his legs, and saw Dr. Justin Mend yesterday who prescribed lasix. He has been taking Epivir 150 mg BID, Retrovir 300 mg BID, and Tivicay 50 mg once daily. He is having vivid dreams and nightmares and asked me if this could be due to any of the HIV medications. He was started on Dapsone in the hospital for PCP prophylaxis (CD4 80) but this was not continued on discharge.   I will change his medications in preparation of starting dialysis in the coming weeks.  I will keep him on Tivicay, change his lamivudine to 50 mg daily instead of 300 mg, and change his zidovudine to abacavir since his HLA is negative and to prevent further anemia since he is likely to have anemia with his kidney failure.  He is agreeable to all of this.  I will also restart him on PCP prophylaxis with dapsone daily.  I discussed HIV and AIDS with him and encouraged him not to miss any doses, especially since he will be on dialysis and have limited options of HIV medications he could take.  Spent a long time with him explaining the medications and made him a calendar with all of his medications on it.  He is also taking amlodipine for his BP.  He comes back  to see Dr. Tommy Medal in ~3 weeks on 9/13. I encouraged him to call with questions or concerns.  His emergency ADAP has been approved.  Plans: - Stop Retrovir and Epivir twice daily - Continue Tivicay 50 mg PO once daily - Start taking Epivir liquid 10 mg/mL - take 5 m/L (50 mg) once daily - Start taking Ziagen 600 mg PO daily - Start taking Dapsone 100 mg PO once daily - F/u with Dr. Tommy Medal 9/13 at 4:15pm  Jonathon Moore L. Donnajean Lopes, PharmD [facility] 01/21/2016, 4:07 PM

## 2016-01-21 NOTE — Telephone Encounter (Signed)
Jonathon Moore called about pulling patient's PICC today. Pt is on PCN x 2 weeks for neurosyphilis. Completed 2 weeks of therapy. Spoke with Dr. Johnnye Sima and received verbal to pull patient's PICC.  Communicated that to Jonathon Moore.

## 2016-01-22 ENCOUNTER — Telehealth: Payer: Self-pay | Admitting: Infectious Diseases

## 2016-01-22 NOTE — Telephone Encounter (Signed)
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  His anemia could be from MAI or his ESRD (or both).   I am equivocal on starting MAI therapy (stool and lung Cx do not have strong predictive value per uptodate).  Given that he is on ART and has f/u on 9-15 will defer starting his therapy.

## 2016-01-22 NOTE — Telephone Encounter (Signed)
Called by home health, pt has Hgb of 8 This is in line with his prev hgb

## 2016-01-23 MED ORDER — DAPSONE 100 MG PO TABS: 100.0000 mg | ORAL_TABLET | Freq: Every day | ORAL | 5 refills | Status: DC

## 2016-01-23 NOTE — Addendum Note (Signed)
Addended by: Horatio Pel on: 01/23/2016 09:33 AM   Modules accepted: Orders

## 2016-01-27 ENCOUNTER — Encounter: Payer: Self-pay | Admitting: Vascular Surgery

## 2016-01-30 ENCOUNTER — Telehealth: Payer: Self-pay | Admitting: *Deleted

## 2016-01-30 NOTE — Telephone Encounter (Signed)
No problem with that at all

## 2016-01-30 NOTE — Telephone Encounter (Signed)
Pattie RN with [facility] called to advise that the patient is done with IV medication and she wants to continue seeing him due to his BP is elevated and his cardiologist nor his kidney doctor will give her orders. She wants to know if Tommy Medal will give her about 2 weeks to work with the patient on getting his BP down and stable. Advised her can ask and call her back.   He does not have a PCP.  Nurse Ilona Sorrel [phone]

## 2016-02-03 ENCOUNTER — Other Ambulatory Visit (HOSPITAL_COMMUNITY): Payer: Self-pay | Admitting: *Deleted

## 2016-02-03 ENCOUNTER — Encounter: Payer: Self-pay | Admitting: Licensed Clinical Social Worker

## 2016-02-03 NOTE — Telephone Encounter (Signed)
Per Dr Tommy Medal called Pattie, RN and advised her he is ok with the 2 week extension in nursing.

## 2016-02-04 ENCOUNTER — Encounter (HOSPITAL_COMMUNITY): Payer: Self-pay

## 2016-02-04 ENCOUNTER — Encounter (HOSPITAL_COMMUNITY)
Admission: RE | Admit: 2016-02-04 | Discharge: 2016-02-04 | Disposition: A | Discharge status: Home / Self Care | Payer: Medicaid Other | Source: Ambulatory Visit | Attending: Nephrology | Admitting: Nephrology

## 2016-02-04 DIAGNOSIS — D631 Anemia in chronic kidney disease: Secondary | ICD-10-CM | POA: Diagnosis present

## 2016-02-04 DIAGNOSIS — N189 Chronic kidney disease, unspecified: Secondary | ICD-10-CM | POA: Diagnosis not present

## 2016-02-04 DIAGNOSIS — N179 Acute kidney failure, unspecified: Secondary | ICD-10-CM

## 2016-02-04 LAB — MAC SUSCEPTIBILITY BROTH
Amikacin: 16
Ciprofloxacin: 4
Moxifloxacin: 2
Rifampin: 2
Streptomycin: 32

## 2016-02-04 LAB — AFB ORGANISM ID BY DNA PROBE
M avium complex: POSITIVE — AB
M tuberculosis complex: NEGATIVE

## 2016-02-04 LAB — POCT HEMOGLOBIN-HEMACUE: HEMOGLOBIN: 6.6 g/dL — AB (ref 13.0–17.0)

## 2016-02-04 LAB — ABO/RH: ABO/RH(D): O POS

## 2016-02-04 LAB — FUNGAL ORGANISM REFLEX

## 2016-02-04 LAB — FUNGUS CULTURE WITH STAIN

## 2016-02-04 LAB — FUNGUS CULTURE RESULT

## 2016-02-04 LAB — PREPARE RBC (CROSSMATCH)

## 2016-02-04 LAB — ACID FAST CULTURE WITH REFLEXED SENSITIVITIES (MYCOBACTERIA): Acid Fast Culture: POSITIVE — AB

## 2016-02-04 MED ORDER — EPOETIN ALFA 20000 UNIT/ML IJ SOLN
INTRAMUSCULAR | Status: AC
  Filled 2016-02-04: qty 1

## 2016-02-04 MED ORDER — EPOETIN ALFA 10000 UNIT/ML IJ SOLN
10000.0000 [IU] | INTRAMUSCULAR | Status: DC
  Administered 2016-02-04: 10000 [IU] via SUBCUTANEOUS

## 2016-02-04 NOTE — Discharge Instructions (Signed)
Epoetin Alfa injection What is this medicine? EPOETIN ALFA (e POE e tin AL fa) helps your body make more red blood cells. This medicine is used to treat anemia caused by chronic kidney failure, cancer chemotherapy, or HIV-therapy. It may also be used before surgery if you have anemia. This medicine may be used for other purposes; ask your health care provider or pharmacist if you have questions. What should I tell my health care provider before I take this medicine? They need to know if you have any of these conditions: -blood clotting disorders -cancer patient not on chemotherapy -cystic fibrosis -heart disease, such as angina or heart failure -hemoglobin level of 12 g/dL or greater -high blood pressure -low levels of folate, iron, or vitamin B12 -seizures -an unusual or allergic reaction to erythropoietin, albumin, benzyl alcohol, hamster proteins, other medicines, foods, dyes, or preservatives -pregnant or trying to get pregnant -breast-feeding How should I use this medicine? This medicine is for injection into a vein or under the skin. It is usually given by a health care professional in a hospital or clinic setting. If you get this medicine at home, you will be taught how to prepare and give this medicine. Use exactly as directed. Take your medicine at regular intervals. Do not take your medicine more often than directed. It is important that you put your used needles and syringes in a special sharps container. Do not put them in a trash can. If you do not have a sharps container, call your pharmacist or healthcare provider to get one. Talk to your pediatrician regarding the use of this medicine in children. While this drug may be prescribed for selected conditions, precautions do apply. Overdosage: If you think you have taken too much of this medicine contact a poison control center or emergency room at once. NOTE: This medicine is only for you. Do not share this medicine with  others. What if I miss a dose? If you miss a dose, take it as soon as you can. If it is almost time for your next dose, take only that dose. Do not take double or extra doses. What may interact with this medicine? Do not take this medicine with any of the following medications: -darbepoetin alfa This list may not describe all possible interactions. Give your health care provider a list of all the medicines, herbs, non-prescription drugs, or dietary supplements you use. Also tell them if you smoke, drink alcohol, or use illegal drugs. Some items may interact with your medicine. What should I watch for while using this medicine? Visit your prescriber or health care professional for regular checks on your progress and for the needed blood tests and blood pressure measurements. It is especially important for the doctor to make sure your hemoglobin level is in the desired range, to limit the risk of potential side effects and to give you the best benefit. Keep all appointments for any recommended tests. Check your blood pressure as directed. Ask your doctor what your blood pressure should be and when you should contact him or her. As your body makes more red blood cells, you may need to take iron, folic acid, or vitamin B supplements. Ask your doctor or health care provider which products are right for you. If you have kidney disease continue dietary restrictions, even though this medication can make you feel better. Talk with your doctor or health care professional about the foods you eat and the vitamins that you take. What side effects may I notice   from receiving this medicine? Side effects that you should report to your doctor or health care professional as soon as possible: -allergic reactions like skin rash, itching or hives, swelling of the face, lips, or tongue -breathing problems -changes in vision -chest pain -confusion, trouble speaking or understanding -feeling faint or lightheaded,  falls -high blood pressure -muscle aches or pains -pain, swelling, warmth in the leg -rapid weight gain -severe headaches -sudden numbness or weakness of the face, arm or leg -trouble walking, dizziness, loss of balance or coordination -seizures (convulsions) -swelling of the ankles, feet, hands -unusually weak or tired Side effects that usually do not require medical attention (report to your doctor or health care professional if they continue or are bothersome): -diarrhea -fever, chills (flu-like symptoms) -headaches -nausea, vomiting -redness, stinging, or swelling at site where injected This list may not describe all possible side effects. Call your doctor for medical advice about side effects. You may report side effects to FDA at 1-800-FDA-1088. Where should I keep my medicine? Keep out of the reach of children. Store in a refrigerator between 2 and 8 degrees C (36 and 46 degrees F). Do not freeze or shake. Throw away any unused portion if using a single-dose vial. Multi-dose vials can be kept in the refrigerator for up to 21 days after the initial dose. Throw away unused medicine. NOTE: This sheet is a summary. It may not cover all possible information. If you have questions about this medicine, talk to your doctor, pharmacist, or health care provider.    2016, Elsevier/Gold Standard. (2008-04-30 10:25:44)  

## 2016-02-04 NOTE — Progress Notes (Signed)
HGB 6.6 Dr Justin Mend notified.  Pt denies visible blood loss,  Pt does c/o fatigue, weakness and sleepiness . Orders to type and screen for 1unit PRBC tomorrow over one hour.

## 2016-02-05 ENCOUNTER — Encounter (HOSPITAL_COMMUNITY)
Admission: RE | Admit: 2016-02-05 | Discharge: 2016-02-05 | Disposition: A | Discharge status: Home / Self Care | Payer: Medicaid Other | Source: Ambulatory Visit | Attending: Nephrology | Admitting: Nephrology

## 2016-02-05 DIAGNOSIS — N189 Chronic kidney disease, unspecified: Secondary | ICD-10-CM | POA: Diagnosis not present

## 2016-02-05 MED ORDER — SODIUM CHLORIDE 0.9 % IV SOLN: Freq: Once | INTRAVENOUS | Status: DC

## 2016-02-06 LAB — TYPE AND SCREEN
ABO/RH(D): O POS
Antibody Screen: NEGATIVE
Unit division: 0

## 2016-02-09 DIAGNOSIS — Z0279 Encounter for issue of other medical certificate: Secondary | ICD-10-CM | POA: Diagnosis not present

## 2016-02-11 ENCOUNTER — Ambulatory Visit (INDEPENDENT_AMBULATORY_CARE_PROVIDER_SITE_OTHER): Payer: Medicaid Other | Admitting: Infectious Disease

## 2016-02-11 ENCOUNTER — Encounter: Payer: Self-pay | Admitting: Infectious Disease

## 2016-02-11 ENCOUNTER — Encounter (HOSPITAL_COMMUNITY)
Admission: RE | Admit: 2016-02-11 | Discharge: 2016-02-11 | Disposition: A | Discharge status: Home / Self Care | Payer: Medicaid Other | Source: Ambulatory Visit | Attending: Nephrology | Admitting: Nephrology

## 2016-02-11 VITALS — BP 130/87 | HR 91 | Temp 98.7°F

## 2016-02-11 DIAGNOSIS — N185 Chronic kidney disease, stage 5: Secondary | ICD-10-CM | POA: Diagnosis not present

## 2016-02-11 DIAGNOSIS — F32A Depression, unspecified: Secondary | ICD-10-CM

## 2016-02-11 DIAGNOSIS — N179 Acute kidney failure, unspecified: Secondary | ICD-10-CM

## 2016-02-11 DIAGNOSIS — A523 Neurosyphilis, unspecified: Secondary | ICD-10-CM

## 2016-02-11 DIAGNOSIS — N08 Glomerular disorders in diseases classified elsewhere: Secondary | ICD-10-CM

## 2016-02-11 DIAGNOSIS — B20 Human immunodeficiency virus [HIV] disease: Secondary | ICD-10-CM | POA: Diagnosis not present

## 2016-02-11 DIAGNOSIS — F329 Major depressive disorder, single episode, unspecified: Secondary | ICD-10-CM

## 2016-02-11 DIAGNOSIS — Z23 Encounter for immunization: Secondary | ICD-10-CM | POA: Diagnosis not present

## 2016-02-11 DIAGNOSIS — N189 Chronic kidney disease, unspecified: Secondary | ICD-10-CM | POA: Diagnosis not present

## 2016-02-11 HISTORY — DX: Chronic kidney disease, stage 5: N18.5

## 2016-02-11 LAB — COMPLETE METABOLIC PANEL WITH GFR
ALK PHOS: 127 U/L — AB (ref 40–115)
ALT: 17 U/L (ref 9–46)
AST: 20 U/L (ref 10–40)
Albumin: 2.2 g/dL — ABNORMAL LOW (ref 3.6–5.1)
BILIRUBIN TOTAL: 0.3 mg/dL (ref 0.2–1.2)
BUN: 14 mg/dL (ref 7–25)
CO2: 24 mmol/L (ref 20–31)
CREATININE: 1.98 mg/dL — AB (ref 0.60–1.35)
Calcium: 8.2 mg/dL — ABNORMAL LOW (ref 8.6–10.3)
Chloride: 107 mmol/L (ref 98–110)
GFR, EST NON AFRICAN AMERICAN: 45 mL/min — AB (ref >=60)
GFR, Est African American: 52 mL/min — ABNORMAL LOW (ref >=60)
GLUCOSE: 73 mg/dL (ref 65–99)
Potassium: 4.1 mmol/L (ref 3.5–5.3)
SODIUM: 138 mmol/L (ref 135–146)
TOTAL PROTEIN: 7.3 g/dL (ref 6.1–8.1)

## 2016-02-11 LAB — CBC WITH DIFFERENTIAL/PLATELET
BASOS ABS: 0 {cells}/uL (ref 0–200)
Basophils Relative: 0 %
EOS ABS: 249 {cells}/uL (ref 15–500)
EOS PCT: 3 %
HCT: 24.2 % — ABNORMAL LOW (ref 38.5–50.0)
Hemoglobin: 7.8 g/dL — ABNORMAL LOW (ref 13.2–17.1)
LYMPHS PCT: 33 %
Lymphs Abs: 2739 cells/uL (ref 850–3900)
MCH: 30.6 pg (ref 27.0–33.0)
MCHC: 32.2 g/dL (ref 32.0–36.0)
MCV: 94.9 fL (ref 80.0–100.0)
MONOS PCT: 7 %
MPV: 8.3 fL (ref 7.5–12.5)
Monocytes Absolute: 581 cells/uL (ref 200–950)
NEUTROS ABS: 4731 {cells}/uL (ref 1500–7800)
NEUTROS PCT: 57 %
PLATELETS: 501 10*3/uL — AB (ref 140–400)
RBC: 2.55 MIL/uL — ABNORMAL LOW (ref 4.20–5.80)
RDW: 19.5 % — AB (ref 11.0–15.0)
WBC: 8.3 10*3/uL (ref 3.8–10.8)

## 2016-02-11 LAB — POCT HEMOGLOBIN-HEMACUE: Hemoglobin: 7.9 g/dL — ABNORMAL LOW (ref 13.0–17.0)

## 2016-02-11 MED ORDER — EPOETIN ALFA 10000 UNIT/ML IJ SOLN
INTRAMUSCULAR | Status: AC
  Filled 2016-02-11: qty 1

## 2016-02-11 MED ORDER — EPOETIN ALFA 10000 UNIT/ML IJ SOLN
10000.0000 [IU] | INTRAMUSCULAR | Status: DC
  Administered 2016-02-11: 10000 [IU] via SUBCUTANEOUS

## 2016-02-11 NOTE — Progress Notes (Signed)
Chief complaint: Lower back pain as well as some darkening of the skin around his eyes  Subjective:    Patient ID: Jonathon Moore, male    DOB: Oct 08, 1985, 30 y.o.   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 He underwent vascular surgery parasite for dialysis but has not yet had this initiated. He is being followed closely by Dr. Edrick Oh with nephrology. He is receiving erythropoietin injections for his anemia of chronic disease.  He has received 2 weeks of intravenous penicillin for his neurosyphilis.  In the hospital I started him on a regimen of TIVICAY 50 mg daily with AZT 300 mg daily and Epivir 50 mg daily.  After discharge the hospital there was a mixup apparently in his medications and he actually came to our clinic on TIVICAY 50 mg daily and Combivir 1 tablet twice daily. He has has haven't changed to 2 tablets of Epzicom for 6 mg daily along with Epivir 50 mg and continued TIVICAY at 50 mg daily. He states his been highly adherent to his medications and not miss doses. He came to clinic cup a by his aunt and he states that he is disclosed his HIV status to his aunt and  to his mother.   Past Medical History:  Diagnosis Date  . Bronchitis   . Hemorrhoids   . HIV (human immunodeficiency virus infection) ([place])     Past Surgical History:  Procedure Laterality Date  . BASCILIC VEIN TRANSPOSITION Left 01/12/2016   Procedure: LEFT ARM FIRST STAGE [place];  Surgeon: Conrad [place], MD;  Location: [facility];  Service: Vascular;  Laterality: Left;  . IR GENERIC HISTORICAL  01/09/2016   IR US GUIDE VASC ACCESS RIGHT 01/09/2016 Marybelle Killings, MD [facility]  . IR GENERIC HISTORICAL  01/09/2016   IR FLUORO GUIDE CV LINE RIGHT 01/09/2016 Marybelle Killings, MD [facility]    No family  history on file.    Social History   Social History  . Marital status: Single    Spouse name: N/A  . Number of children: N/A  . Years of education: N/A   Social History Main Topics  . Smoking status: Current Every Day Smoker    Packs/day: 0.20  . Smokeless tobacco: Never Used  . Alcohol use Yes     Comment: weekly  . Drug use:     Types: Marijuana  . Sexual activity: Not Asked   Other Topics Concern  . None   Social History Narrative  . None    No Known Allergies   Current Outpatient Prescriptions:  .  abacavir (ZIAGEN) 300 MG tablet, Take 2 tablets (600 mg total) by mouth daily., Disp: 60 tablet, Rfl: 6 .  amLODipine (NORVASC) 5 MG tablet, Take 1 tablet (5 mg total) by mouth at bedtime., Disp: 30 tablet, Rfl: 2 .  dapsone 100 MG tablet, Take 1 tablet (100 mg total) by mouth daily., Disp: 30 tablet, Rfl: 5 .  dolutegravir (TIVICAY) 50 MG tablet, Take 1 tablet (50 mg total) by mouth daily., Disp: 30 tablet, Rfl: 4 .  lamiVUDine (EPIVIR) 10 MG/ML solution, Take 5 mLs (50 mg total) by mouth daily., Disp: 240 mL, Rfl: 5 .  furosemide (LASIX) 80 MG tablet, TK 1 T PO BID, Disp: , Rfl: 12 .  ondansetron (ZOFRAN-ODT) 8 MG disintegrating  tablet, Take 1 tablet (8 mg total) by mouth every 8 (eight) hours as needed for nausea or vomiting. (Patient not taking: Reported on 02/11/2016), Disp: 20 tablet, Rfl: 0 .  penicillin G potassium 4 Million Units in dextrose 5 % 250 mL, Inject 4 Million Units into the vein every 8 (eight) hours. (Patient not taking: Reported on 02/11/2016), Disp: 96 Million Units, Rfl: 0 No current facility-administered medications for this visit.   Facility-Administered Medications Ordered in Other Visits:  .  epoetin alfa (EPOGEN,PROCRIT) 32671 UNIT/ML injection, , , ,  .  epoetin alfa (EPOGEN,PROCRIT) injection 10,000 Units, 10,000 Units, Subcutaneous, Weekly, Edrick Oh, MD, 10,000 Units at 02/11/16 1343   Review of Systems  Constitutional: Negative for  chills, diaphoresis and fever.  HENT: Negative for congestion, hearing loss, sore throat and tinnitus.   Respiratory: Negative for cough, shortness of breath and wheezing.   Cardiovascular: Negative for chest pain, palpitations and leg swelling.  Gastrointestinal: Negative for abdominal pain, blood in stool, constipation, diarrhea, nausea and vomiting.  Genitourinary: Negative for dysuria, flank pain and hematuria.  Musculoskeletal: Negative for back pain and myalgias.  Skin: Negative for rash.  Neurological: Negative for dizziness, weakness and headaches.  Hematological: Does not bruise/bleed easily.  Psychiatric/Behavioral: Negative for suicidal ideas. The patient is not nervous/anxious.        Objective:   Physical Exam  Constitutional: He is oriented to person, place, and time. He appears well-developed and well-nourished. No distress.  HENT:  Head: Normocephalic and atraumatic.  Mouth/Throat: No oropharyngeal exudate.  Eyes: Conjunctivae and EOM are normal. No scleral icterus.  Neck: Normal range of motion. Neck supple.  Cardiovascular: Normal rate and regular rhythm.   Pulmonary/Chest: Effort normal. No respiratory distress. He has no wheezes.  Abdominal: He exhibits no distension.  Musculoskeletal: He exhibits no edema or tenderness.  Neurological: He is alert and oriented to person, place, and time. He exhibits normal muscle tone. Coordination normal.  Skin: Skin is warm and dry. No rash noted. He is not diaphoretic. No erythema. No pallor.  Psychiatric: He has a normal mood and affect. His behavior is normal. Judgment and thought content normal.    AV site is clean       Assessment & Plan:   HIV/AIDS: continue Ziagen 600 mg daily with TIVICAY 50 mg daily and Epivir 50 mg daily. Continue dapsone 100 mg daily check viral load was reflexed to genotype today and CD4 count  Neurosyphilis: Follow RPR titers of the next 4-6 months in thereafter.  STI screening check  gonorrhea chlamydia from urine today consider oral rec oral and rectal testing at future appointment  Adjustment and depression anxiety: His aunt had a concern about his being able to talk to enough people about his condition and bill to get things off his chest. I recommended that he meet with her counselor in future and also the attend the [facility] support group meetings  Lower back pain: not clear cause of this   Darkening under eyes: ? Cause of this either I told him once he was on HD and fluid status clarified and his HIV under control we could reconsider if any thing else might be going on  HIVAN with ESRD needing HD soon: I am checking CMP today  I spent greater than 40 minutes with the patient including greater than 50% of time in face to face counsel of the patient and his aunt re his HIV/AIDS, neurosyphilis, HIVAN, ESRD and in coordination of their care.

## 2016-02-12 ENCOUNTER — Other Ambulatory Visit: Payer: Self-pay | Admitting: Infectious Disease

## 2016-02-12 MED ORDER — ABACAVIR-DOLUTEGRAVIR-LAMIVUD 600-50-300 MG PO TABS: 1.0000 | ORAL_TABLET | Freq: Every day | ORAL | 11 refills | Status: DC

## 2016-02-12 NOTE — Progress Notes (Signed)
pts GFR is now at level where he can be on TRIUMEQ once daily instead of components. He also needs larger amount of lamivudine now

## 2016-02-13 ENCOUNTER — Telehealth: Payer: Self-pay | Admitting: Infectious Disease

## 2016-02-13 LAB — T-HELPER CELL (CD4) - (RCID CLINIC ONLY)
CD4 T CELL HELPER: 12 % — AB (ref 33–55)
CD4 T Cell Abs: 370 /uL — ABNORMAL LOW (ref 400–2700)

## 2016-02-13 LAB — URINE CYTOLOGY ANCILLARY ONLY
CHLAMYDIA, DNA PROBE: NEGATIVE
Neisseria Gonorrhea: NEGATIVE

## 2016-02-13 NOTE — Telephone Encounter (Signed)
I called Jonathon Moore today and asked that he pick up his [name] and stop taking the liquid lamivudine two ziagens and Tivicay  His renal recovery is really remarkable

## 2016-02-15 LAB — HIV RNA, RTPCR W/R GT (RTI, PI,INT)
HIV 1 RNA QUANT: 151 {copies}/mL — AB
HIV-1 RNA Quant, Log: 2.18 Log copies/mL — ABNORMAL HIGH

## 2016-02-18 ENCOUNTER — Encounter (HOSPITAL_COMMUNITY)
Admission: RE | Admit: 2016-02-18 | Discharge: 2016-02-18 | Disposition: A | Discharge status: Home / Self Care | Payer: Medicaid Other | Source: Ambulatory Visit | Attending: Nephrology | Admitting: Nephrology

## 2016-02-18 DIAGNOSIS — N189 Chronic kidney disease, unspecified: Secondary | ICD-10-CM | POA: Diagnosis not present

## 2016-02-18 DIAGNOSIS — N179 Acute kidney failure, unspecified: Secondary | ICD-10-CM

## 2016-02-18 LAB — POCT HEMOGLOBIN-HEMACUE: Hemoglobin: 8.6 g/dL — ABNORMAL LOW (ref 13.0–17.0)

## 2016-02-18 LAB — ACID FAST CULTURE WITH REFLEXED SENSITIVITIES

## 2016-02-18 LAB — ACID FAST CULTURE WITH REFLEXED SENSITIVITIES (MYCOBACTERIA): Acid Fast Culture: NEGATIVE

## 2016-02-18 MED ORDER — EPOETIN ALFA 10000 UNIT/ML IJ SOLN
10000.0000 [IU] | INTRAMUSCULAR | Status: DC
  Administered 2016-02-18: 10000 [IU] via SUBCUTANEOUS

## 2016-02-18 MED ORDER — EPOETIN ALFA 10000 UNIT/ML IJ SOLN
INTRAMUSCULAR | Status: AC
  Filled 2016-02-18: qty 1

## 2016-02-20 ENCOUNTER — Encounter: Payer: Self-pay | Admitting: Vascular Surgery

## 2016-02-24 ENCOUNTER — Ambulatory Visit (INDEPENDENT_AMBULATORY_CARE_PROVIDER_SITE_OTHER): Payer: Medicaid Other | Admitting: Infectious Disease

## 2016-02-24 ENCOUNTER — Encounter: Payer: Self-pay | Admitting: Infectious Disease

## 2016-02-24 ENCOUNTER — Ambulatory Visit: Payer: PRIVATE HEALTH INSURANCE | Admitting: *Deleted

## 2016-02-24 VITALS — BP 122/80 | HR 86 | Temp 98.8°F | Ht 72.0 in | Wt 169.0 lb

## 2016-02-24 DIAGNOSIS — B20 Human immunodeficiency virus [HIV] disease: Secondary | ICD-10-CM

## 2016-02-24 DIAGNOSIS — Z23 Encounter for immunization: Secondary | ICD-10-CM

## 2016-02-24 DIAGNOSIS — N08 Glomerular disorders in diseases classified elsewhere: Secondary | ICD-10-CM | POA: Diagnosis not present

## 2016-02-24 DIAGNOSIS — A523 Neurosyphilis, unspecified: Secondary | ICD-10-CM | POA: Diagnosis not present

## 2016-02-24 DIAGNOSIS — R0789 Other chest pain: Secondary | ICD-10-CM

## 2016-02-24 DIAGNOSIS — F32A Depression, unspecified: Secondary | ICD-10-CM

## 2016-02-24 DIAGNOSIS — F329 Major depressive disorder, single episode, unspecified: Secondary | ICD-10-CM | POA: Diagnosis not present

## 2016-02-24 DIAGNOSIS — M545 Low back pain, unspecified: Secondary | ICD-10-CM

## 2016-02-24 HISTORY — DX: Low back pain, unspecified: M54.50

## 2016-02-24 HISTORY — DX: Other chest pain: R07.89

## 2016-02-24 NOTE — Progress Notes (Signed)
    Postoperative Access Visit   History of Present Illness  Jonathon Moore is a 30 y.o. year old male who presents for postoperative follow-up for: L 1st stage BVT (Date: 01/12/16).  The patient's wounds are healed.  The patient notes no steal symptoms.  The patient is able to complete their activities of daily living.  The patient's current symptoms are: back and flank pain.  Pt is no longer on HD.  His kidney function has remarkably recovered.   For VQI Use Only  PRE-ADM LIVING: Home  AMB STATUS: Ambulatory  Physical Examination Vitals:   02/27/16 1038  BP: 136/79  Pulse: 89  Resp: 18  Temp: 98.6 F (37 C)    LUE: Incision is healed, skin feels warm, hand grip is 5/5, sensation in digits is intact, strongly palpable thrill, bruit can be auscultated   BMP Latest Ref Rng & Units 02/24/2016 02/11/2016 01/12/2016  Glucose 65 - 99 mg/dL 69 73 105(H)  BUN 7 - 25 mg/dL 17 14 72(H)  Creatinine 0.60 - 1.35 mg/dL 1.92(H) 1.98(H) 9.39(H)  Sodium 135 - 146 mmol/L 135 138 142  Potassium 3.5 - 5.3 mmol/L 4.8 4.1 4.9  Chloride 98 - 110 mmol/L 103 107 117(H)  CO2 20 - 31 mmol/L 23 24 16(L)  Calcium 8.6 - 10.3 mg/dL 8.6 8.2(L) 7.6(L)    Medical Decision Making  Jonathon Moore is a 30 y.o. year old male who presents s/p L 1st stage BVT   I would hold off on transposition as the patient has had renal recovery.  If his kidney function continues to deteriorate, at that point would proceed with transposition, ie. L 2nd stage BVT.   Adele Barthel, MD, FACS [facility] Office: [phone] Pager: [phone]

## 2016-02-24 NOTE — BH Specialist Note (Signed)
Counselor met with [name] today for a warm hand off from nurse.  Patient was oriented times four with flat affect.  Patient did not say much but appeared to listen accordingly.  Patient was accompanied by his mother. Counselor made necessary introductions and shared a business card that has contact information on it.  Patient  Jonathon Moore he did not feel that he needed counseling at this time but would be in touch if the need arose.    Rolena Infante, MA, LPC [facility]

## 2016-02-24 NOTE — Progress Notes (Signed)
Chief complaint: Lower back pain that has persisted, intermittent chest pain  Subjective:    Patient ID: Jonathon Moore, male    DOB: April 28, 1986, 28 y.o.   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  He underwent vascular surgery for dialysis but had not yet had this initiated.   He has received 2 weeks of intravenous penicillin for his neurosyphilis.  In the hospital I started him on a regimen of TIVICAY 50 mg daily with AZT 300 mg daily and Epivir 50 mg daily.  After discharge the hospital there was a mixup apparently in his medications and he actually came to our clinic on TIVICAY 50 mg daily and Combivir 1 tablet twice daily. He has has haven't changed to 2 tablets of Epzicom for 6 mg daily along with Epivir 50 mg and continued TIVICAY at 50 mg daily.   When I saw him in clinic a few weeks ago he had not only nearly suppressed his virus to undetectable levels and raised his CD4 above 300 but his serum creatinine had dropped from 9 to 2.58. I changed him to [facility] one pill once daily which he has been taking. He states that his urine is still with bubbles in it. He claims to be highly adherent to his meds. He is accompanied by his mother today.   Lab Results  Component Value Date   HIV1RNAQUANT 151 (H) 02/11/2016    Lab Results  Component Value Date   CD4TABS 370 (L) 02/11/2016   CD4TABS 80 (L) 01/03/2016   CMP Latest Ref Rng & Units 02/11/2016 01/12/2016 01/11/2016  Glucose 65 - 99 mg/dL 73 105(H) 96  BUN 7 - 25 mg/dL 14 72(H) 74(H)  Creatinine 0.60 - 1.35 mg/dL 1.98(H) 9.39(H) 9.25(H)  Sodium 135 - 146 mmol/L 138 142 140  Potassium 3.5 - 5.3 mmol/L 4.1 4.9 4.8  Chloride 98 - 110 mmol/L 107 117(H) 111  CO2 20 - 31 mmol/L 24 16(L) 18(L)  Calcium 8.6 - 10.3 mg/dL 8.2(L) 7.6(L) 7.5(L)  Total Protein 6.1 - 8.1 g/dL 7.3 - -    Total Bilirubin 0.2 - 1.2 mg/dL 0.3 - -  Alkaline Phos 40 - 115 U/L 127(H) - -  AST 10 - 40 U/L 20 - -  ALT 9 - 46 U/L 17 - -      He has fleeting left sided chest pain that is sharp and lasting for a few seconds but not accompanied by any nausea radiation of the pain, light headedness. It sounds as if this may be related to anxiety vs heart palpitations.     Past Medical History:  Diagnosis Date  . Bronchitis   . CKD (chronic kidney disease) stage 5, GFR less than 15 ml/min (HCC) 02/11/2016  . Hemorrhoids   . HIV (human immunodeficiency virus infection) ([place])     Past Surgical History:  Procedure Laterality Date  . BASCILIC VEIN TRANSPOSITION Left 01/12/2016   Procedure: LEFT ARM FIRST STAGE [place];  Surgeon: Conrad Preston, MD;  Location: [facility];  Service: Vascular;  Laterality: Left;  . IR GENERIC HISTORICAL  01/09/2016   IR US GUIDE VASC ACCESS RIGHT 01/09/2016 Marybelle Killings, MD [facility]  . IR GENERIC HISTORICAL  01/09/2016   IR FLUORO GUIDE CV LINE RIGHT 01/09/2016 Marybelle Killings, MD [facility]    No family history on file.    Social  History   Social History  . Marital status: Single    Spouse name: N/A  . Number of children: N/A  . Years of education: N/A   Social History Main Topics  . Smoking status: Current Every Day Smoker    Packs/day: 0.20  . Smokeless tobacco: Never Used  . Alcohol use Yes     Comment: weekly  . Drug use:     Types: Marijuana  . Sexual activity: No     Comment: given condoms   Other Topics Concern  . None   Social History Narrative  . None    No Known Allergies   Current Outpatient Prescriptions:  .  abacavir-dolutegravir-lamiVUDine (TRIUMEQ) 600-50-300 MG tablet, Take 1 tablet by mouth daily., Disp: 30 tablet, Rfl: 11 .  amLODipine (NORVASC) 5 MG tablet, Take 1 tablet (5 mg total) by mouth at bedtime., Disp: 30 tablet, Rfl: 2 .  dapsone 100 MG tablet, Take 1 tablet (100 mg total) by mouth daily., Disp: 30  tablet, Rfl: 5 .  furosemide (LASIX) 80 MG tablet, TK 1 T PO BID, Disp: , Rfl: 12 .  SSD 1 % cream, APPLY AA BID., Disp: , Rfl: 0   Review of Systems  Constitutional: Negative for chills, diaphoresis and fever.  HENT: Negative for congestion, hearing loss, sore throat and tinnitus.   Respiratory: Negative for cough, shortness of breath and wheezing.   Cardiovascular: Positive for chest pain. Negative for palpitations and leg swelling.  Gastrointestinal: Negative for abdominal pain, blood in stool, constipation, diarrhea, nausea and vomiting.  Genitourinary: Negative for dysuria, flank pain and hematuria.  Musculoskeletal: Positive for back pain. Negative for myalgias.  Skin: Negative for rash.  Neurological: Negative for dizziness, weakness and headaches.  Hematological: Does not bruise/bleed easily.  Psychiatric/Behavioral: Negative for suicidal ideas. The patient is nervous/anxious.        Objective:   Physical Exam  Constitutional: He is oriented to person, place, and time. He appears well-developed and well-nourished. No distress.  HENT:  Head: Normocephalic and atraumatic.  Mouth/Throat: No oropharyngeal exudate.  Eyes: Conjunctivae and EOM are normal. No scleral icterus.  Neck: Normal range of motion. Neck supple.  Cardiovascular: Normal rate and regular rhythm.   Pulmonary/Chest: Effort normal. No respiratory distress. He has no wheezes.  Abdominal: He exhibits no distension.  Musculoskeletal: He exhibits no edema or tenderness.  Neurological: He is alert and oriented to person, place, and time. He exhibits normal muscle tone. Coordination normal.  Skin: Skin is warm and dry. No rash noted. He is not diaphoretic. No erythema. No pallor.  Psychiatric: He has a normal mood and affect. His behavior is normal. Judgment and thought content normal.    AV site is clean       Assessment & Plan:   HIV/AIDS: perfect control. Continue TRIUMEQ. Will recheck VL today  He needs  to continue dapsone  Neurosyphilis: Follow RPR titers of the next 4-6 months in thereafter.  HIVAN: remarkably his serum cr came down to 2.98 from prior levels above 9   Lower back pain: not clear cause of this. Can consider MRI in future but needs orange card  Atypical chest pain: could be due to palpitations, anxiety or IRIS  Depression and anxiety: supportive care. He does not want SSRI  I spent greater than 40 minutes with the patient including greater than 50% of time in face to face counsel of the patient and his mother re his HIV/AIDS, HIVAN neurosyphilis, HIVAN, depression, atypical chest pain,  lower back pain  and in coordination of his care.

## 2016-02-25 ENCOUNTER — Encounter (HOSPITAL_COMMUNITY)
Admission: RE | Admit: 2016-02-25 | Discharge: 2016-02-25 | Disposition: A | Discharge status: Home / Self Care | Payer: Medicaid Other | Source: Ambulatory Visit | Attending: Nephrology | Admitting: Nephrology

## 2016-02-25 DIAGNOSIS — N189 Chronic kidney disease, unspecified: Secondary | ICD-10-CM | POA: Diagnosis not present

## 2016-02-25 DIAGNOSIS — N179 Acute kidney failure, unspecified: Secondary | ICD-10-CM

## 2016-02-25 LAB — COMPLETE METABOLIC PANEL WITH GFR
ALK PHOS: 123 U/L — AB (ref 40–115)
ALT: 21 U/L (ref 9–46)
AST: 22 U/L (ref 10–40)
Albumin: 2.6 g/dL — ABNORMAL LOW (ref 3.6–5.1)
BUN: 17 mg/dL (ref 7–25)
CHLORIDE: 103 mmol/L (ref 98–110)
CO2: 23 mmol/L (ref 20–31)
Calcium: 8.6 mg/dL (ref 8.6–10.3)
Creat: 1.92 mg/dL — ABNORMAL HIGH (ref 0.60–1.35)
GFR, EST NON AFRICAN AMERICAN: 46 mL/min — AB (ref >=60)
GFR, Est African American: 54 mL/min — ABNORMAL LOW (ref >=60)
GLUCOSE: 69 mg/dL (ref 65–99)
Potassium: 4.8 mmol/L (ref 3.5–5.3)
SODIUM: 135 mmol/L (ref 135–146)
TOTAL PROTEIN: 7.6 g/dL (ref 6.1–8.1)
Total Bilirubin: 0.4 mg/dL (ref 0.2–1.2)

## 2016-02-25 LAB — POCT HEMOGLOBIN-HEMACUE: HEMOGLOBIN: 9.3 g/dL — AB (ref 13.0–17.0)

## 2016-02-25 MED ORDER — EPOETIN ALFA 10000 UNIT/ML IJ SOLN
10000.0000 [IU] | INTRAMUSCULAR | Status: DC
  Administered 2016-02-25: 10000 [IU] via SUBCUTANEOUS

## 2016-02-25 MED ORDER — EPOETIN ALFA 10000 UNIT/ML IJ SOLN
INTRAMUSCULAR | Status: AC
  Administered 2016-02-25: 10000 [IU] via SUBCUTANEOUS
  Filled 2016-02-25: qty 1

## 2016-02-27 ENCOUNTER — Encounter: Payer: Self-pay | Admitting: Vascular Surgery

## 2016-02-27 ENCOUNTER — Ambulatory Visit (INDEPENDENT_AMBULATORY_CARE_PROVIDER_SITE_OTHER): Payer: Self-pay | Admitting: Vascular Surgery

## 2016-02-27 VITALS — BP 136/79 | HR 89 | Temp 98.6°F | Resp 18 | Ht 72.0 in | Wt 171.0 lb

## 2016-02-27 DIAGNOSIS — N185 Chronic kidney disease, stage 5: Secondary | ICD-10-CM

## 2016-02-28 LAB — HIV RNA, RTPCR W/R GT (RTI, PI,INT)
HIV 1 RNA Quant: 156 copies/mL — ABNORMAL HIGH
HIV-1 RNA QUANT, LOG: 2.19 {Log_copies}/mL — AB

## 2016-03-03 ENCOUNTER — Encounter (HOSPITAL_COMMUNITY)
Admission: RE | Admit: 2016-03-03 | Discharge: 2016-03-03 | Disposition: A | Discharge status: Home / Self Care | Payer: Medicaid Other | Source: Ambulatory Visit | Attending: Nephrology | Admitting: Nephrology

## 2016-03-03 DIAGNOSIS — N189 Chronic kidney disease, unspecified: Secondary | ICD-10-CM | POA: Diagnosis not present

## 2016-03-03 DIAGNOSIS — D631 Anemia in chronic kidney disease: Secondary | ICD-10-CM | POA: Insufficient documentation

## 2016-03-03 DIAGNOSIS — N179 Acute kidney failure, unspecified: Secondary | ICD-10-CM

## 2016-03-03 LAB — POCT HEMOGLOBIN-HEMACUE: HEMOGLOBIN: 9 g/dL — AB (ref 13.0–17.0)

## 2016-03-03 LAB — IRON AND TIBC
Iron: 23 ug/dL — ABNORMAL LOW (ref 45–182)
SATURATION RATIOS: 10 % — AB (ref 17.9–39.5)
TIBC: 238 ug/dL — ABNORMAL LOW (ref 250–450)
UIBC: 215 ug/dL

## 2016-03-03 LAB — FERRITIN: Ferritin: 571 ng/mL — ABNORMAL HIGH (ref 24–336)

## 2016-03-03 MED ORDER — EPOETIN ALFA 10000 UNIT/ML IJ SOLN
INTRAMUSCULAR | Status: AC
  Administered 2016-03-03: 10000 [IU] via SUBCUTANEOUS
  Filled 2016-03-03: qty 1

## 2016-03-03 MED ORDER — EPOETIN ALFA 10000 UNIT/ML IJ SOLN
10000.0000 [IU] | INTRAMUSCULAR | Status: DC
  Administered 2016-03-03: 10000 [IU] via SUBCUTANEOUS

## 2016-03-04 NOTE — Addendum Note (Signed)
Addended by: Landis Gandy on: 03/04/2016 11:53 AM   Modules accepted: Orders

## 2016-03-10 ENCOUNTER — Encounter (HOSPITAL_COMMUNITY)
Admission: RE | Admit: 2016-03-10 | Discharge: 2016-03-10 | Disposition: A | Discharge status: Home / Self Care | Payer: Medicaid Other | Source: Ambulatory Visit | Attending: Nephrology | Admitting: Nephrology

## 2016-03-10 DIAGNOSIS — N179 Acute kidney failure, unspecified: Secondary | ICD-10-CM

## 2016-03-10 DIAGNOSIS — N189 Chronic kidney disease, unspecified: Secondary | ICD-10-CM | POA: Diagnosis not present

## 2016-03-10 MED ORDER — EPOETIN ALFA 10000 UNIT/ML IJ SOLN
10000.0000 [IU] | INTRAMUSCULAR | Status: DC
  Administered 2016-03-10: 10000 [IU] via SUBCUTANEOUS

## 2016-03-10 MED ORDER — EPOETIN ALFA 10000 UNIT/ML IJ SOLN
INTRAMUSCULAR | Status: AC
  Filled 2016-03-10: qty 1

## 2016-03-11 ENCOUNTER — Encounter: Payer: Self-pay | Admitting: Infectious Disease

## 2016-03-11 LAB — POCT HEMOGLOBIN-HEMACUE: Hemoglobin: 8.9 g/dL — ABNORMAL LOW (ref 13.0–17.0)

## 2016-03-17 ENCOUNTER — Encounter (HOSPITAL_COMMUNITY)
Admission: RE | Admit: 2016-03-17 | Discharge: 2016-03-17 | Disposition: A | Discharge status: Home / Self Care | Payer: Medicaid Other | Source: Ambulatory Visit | Attending: Nephrology | Admitting: Nephrology

## 2016-03-17 DIAGNOSIS — N189 Chronic kidney disease, unspecified: Secondary | ICD-10-CM | POA: Diagnosis not present

## 2016-03-17 DIAGNOSIS — N179 Acute kidney failure, unspecified: Secondary | ICD-10-CM

## 2016-03-17 LAB — POCT HEMOGLOBIN-HEMACUE: Hemoglobin: 9.1 g/dL — ABNORMAL LOW (ref 13.0–17.0)

## 2016-03-17 MED ORDER — EPOETIN ALFA 10000 UNIT/ML IJ SOLN
10000.0000 [IU] | INTRAMUSCULAR | Status: DC
  Administered 2016-03-17: 10000 [IU] via SUBCUTANEOUS

## 2016-03-17 MED ORDER — EPOETIN ALFA 10000 UNIT/ML IJ SOLN
INTRAMUSCULAR | Status: AC
  Filled 2016-03-17: qty 1

## 2016-03-24 ENCOUNTER — Encounter (HOSPITAL_COMMUNITY)
Admission: RE | Admit: 2016-03-24 | Discharge: 2016-03-24 | Disposition: A | Discharge status: Home / Self Care | Payer: Medicaid Other | Source: Ambulatory Visit | Attending: Nephrology | Admitting: Nephrology

## 2016-03-24 DIAGNOSIS — N189 Chronic kidney disease, unspecified: Secondary | ICD-10-CM | POA: Diagnosis not present

## 2016-03-24 DIAGNOSIS — N179 Acute kidney failure, unspecified: Secondary | ICD-10-CM

## 2016-03-24 LAB — POCT HEMOGLOBIN-HEMACUE: Hemoglobin: 9.2 g/dL — ABNORMAL LOW (ref 13.0–17.0)

## 2016-03-24 MED ORDER — EPOETIN ALFA 10000 UNIT/ML IJ SOLN
10000.0000 [IU] | INTRAMUSCULAR | Status: DC
  Administered 2016-03-24: 10000 [IU] via SUBCUTANEOUS

## 2016-03-24 MED ORDER — EPOETIN ALFA 10000 UNIT/ML IJ SOLN
INTRAMUSCULAR | Status: DC
Start: 2016-03-24 — End: 2016-03-25
  Filled 2016-03-24: qty 1

## 2016-03-25 ENCOUNTER — Encounter: Payer: Self-pay | Admitting: Infectious Disease

## 2016-03-25 ENCOUNTER — Other Ambulatory Visit: Payer: Medicaid Other

## 2016-03-25 DIAGNOSIS — B20 Human immunodeficiency virus [HIV] disease: Secondary | ICD-10-CM

## 2016-03-25 LAB — COMPLETE METABOLIC PANEL WITH GFR
ALT: 17 U/L (ref 9–46)
AST: 21 U/L (ref 10–40)
Albumin: 2.6 g/dL — ABNORMAL LOW (ref 3.6–5.1)
Alkaline Phosphatase: 92 U/L (ref 40–115)
BILIRUBIN TOTAL: 0.5 mg/dL (ref 0.2–1.2)
BUN: 12 mg/dL (ref 7–25)
CO2: 24 mmol/L (ref 20–31)
CREATININE: 1.8 mg/dL — AB (ref 0.60–1.35)
Calcium: 8.6 mg/dL (ref 8.6–10.3)
Chloride: 100 mmol/L (ref 98–110)
GFR, Est African American: 58 mL/min — ABNORMAL LOW (ref >=60)
GFR, Est Non African American: 50 mL/min — ABNORMAL LOW (ref >=60)
GLUCOSE: 127 mg/dL — AB (ref 65–99)
Potassium: 3.6 mmol/L (ref 3.5–5.3)
SODIUM: 135 mmol/L (ref 135–146)
TOTAL PROTEIN: 8.5 g/dL — AB (ref 6.1–8.1)

## 2016-03-25 LAB — CBC WITH DIFFERENTIAL/PLATELET
BASOS PCT: 1 %
Basophils Absolute: 89 cells/uL (ref 0–200)
EOS ABS: 445 {cells}/uL (ref 15–500)
EOS PCT: 5 %
HCT: 30.9 % — ABNORMAL LOW (ref 38.5–50.0)
Hemoglobin: 9.9 g/dL — ABNORMAL LOW (ref 13.2–17.1)
Lymphocytes Relative: 18 %
Lymphs Abs: 1602 cells/uL (ref 850–3900)
MCH: 29.4 pg (ref 27.0–33.0)
MCHC: 32 g/dL (ref 32.0–36.0)
MCV: 91.7 fL (ref 80.0–100.0)
MONOS PCT: 9 %
MPV: 8.2 fL (ref 7.5–12.5)
Monocytes Absolute: 801 cells/uL (ref 200–950)
NEUTROS ABS: 5963 {cells}/uL (ref 1500–7800)
Neutrophils Relative %: 67 %
Platelets: 497 10*3/uL — ABNORMAL HIGH (ref 140–400)
RBC: 3.37 MIL/uL — AB (ref 4.20–5.80)
RDW: 15.4 % — ABNORMAL HIGH (ref 11.0–15.0)
WBC: 8.9 10*3/uL (ref 3.8–10.8)

## 2016-03-26 LAB — T-HELPER CELL (CD4) - (RCID CLINIC ONLY)
CD4 T CELL ABS: 220 /uL — AB (ref 400–2700)
CD4 T CELL HELPER: 13 % — AB (ref 33–55)

## 2016-03-29 LAB — HIV-1 RNA ULTRAQUANT REFLEX TO GENTYP+
HIV 1 RNA QUANT: 98 {copies}/mL — AB (ref <20)
HIV-1 RNA QUANT, LOG: 1.99 {Log_copies}/mL — AB (ref <1.30)

## 2016-03-31 ENCOUNTER — Encounter (HOSPITAL_COMMUNITY)
Admission: RE | Admit: 2016-03-31 | Discharge: 2016-03-31 | Disposition: A | Discharge status: Home / Self Care | Payer: Medicaid Other | Source: Ambulatory Visit | Attending: Nephrology | Admitting: Nephrology

## 2016-03-31 DIAGNOSIS — D631 Anemia in chronic kidney disease: Secondary | ICD-10-CM | POA: Diagnosis present

## 2016-03-31 DIAGNOSIS — N179 Acute kidney failure, unspecified: Secondary | ICD-10-CM

## 2016-03-31 DIAGNOSIS — N189 Chronic kidney disease, unspecified: Secondary | ICD-10-CM | POA: Insufficient documentation

## 2016-03-31 LAB — POCT HEMOGLOBIN-HEMACUE: Hemoglobin: 8.7 g/dL — ABNORMAL LOW (ref 13.0–17.0)

## 2016-03-31 MED ORDER — EPOETIN ALFA 10000 UNIT/ML IJ SOLN: 10000.0000 [IU] | INTRAMUSCULAR | Status: DC

## 2016-03-31 MED ORDER — EPOETIN ALFA 20000 UNIT/ML IJ SOLN
INTRAMUSCULAR | Status: AC
  Administered 2016-03-31: 10000 [IU] via SUBCUTANEOUS
  Filled 2016-03-31: qty 1

## 2016-04-02 ENCOUNTER — Encounter: Payer: Self-pay | Admitting: Infectious Disease

## 2016-04-07 ENCOUNTER — Ambulatory Visit: Payer: PRIVATE HEALTH INSURANCE | Admitting: Infectious Disease

## 2016-04-07 ENCOUNTER — Encounter (HOSPITAL_COMMUNITY)
Admission: RE | Admit: 2016-04-07 | Discharge: 2016-04-07 | Disposition: A | Discharge status: Home / Self Care | Payer: Medicaid Other | Source: Ambulatory Visit | Attending: Nephrology | Admitting: Nephrology

## 2016-04-07 DIAGNOSIS — N189 Chronic kidney disease, unspecified: Secondary | ICD-10-CM | POA: Diagnosis not present

## 2016-04-07 DIAGNOSIS — N179 Acute kidney failure, unspecified: Secondary | ICD-10-CM

## 2016-04-07 LAB — IRON AND TIBC
IRON: 49 ug/dL (ref 45–182)
SATURATION RATIOS: 19 % (ref 17.9–39.5)
TIBC: 253 ug/dL (ref 250–450)
UIBC: 204 ug/dL

## 2016-04-07 LAB — FERRITIN: Ferritin: 487 ng/mL — ABNORMAL HIGH (ref 24–336)

## 2016-04-07 LAB — POCT HEMOGLOBIN-HEMACUE: Hemoglobin: 8.8 g/dL — ABNORMAL LOW (ref 13.0–17.0)

## 2016-04-07 MED ORDER — EPOETIN ALFA 10000 UNIT/ML IJ SOLN
INTRAMUSCULAR | Status: AC
  Filled 2016-04-07: qty 1

## 2016-04-07 MED ORDER — EPOETIN ALFA 10000 UNIT/ML IJ SOLN
10000.0000 [IU] | INTRAMUSCULAR | Status: DC
  Administered 2016-04-07: 10000 [IU] via SUBCUTANEOUS

## 2016-04-10 ENCOUNTER — Other Ambulatory Visit: Payer: Self-pay | Admitting: Internal Medicine

## 2016-04-14 ENCOUNTER — Inpatient Hospital Stay (HOSPITAL_COMMUNITY): Admission: RE | Admit: 2016-04-14 | Payer: Medicaid Other | Source: Ambulatory Visit

## 2016-04-19 ENCOUNTER — Ambulatory Visit: Payer: PRIVATE HEALTH INSURANCE | Admitting: Infectious Disease

## 2016-04-20 ENCOUNTER — Encounter: Payer: Self-pay | Admitting: *Deleted

## 2016-04-20 ENCOUNTER — Other Ambulatory Visit: Payer: Self-pay | Admitting: *Deleted

## 2016-04-20 ENCOUNTER — Telehealth: Payer: Self-pay | Admitting: *Deleted

## 2016-04-20 DIAGNOSIS — N179 Acute kidney failure, unspecified: Secondary | ICD-10-CM

## 2016-04-20 MED ORDER — AMLODIPINE BESYLATE 5 MG PO TABS: 5.0000 mg | ORAL_TABLET | Freq: Every day | ORAL | 5 refills | Status: DC

## 2016-04-20 NOTE — Telephone Encounter (Signed)
Missed appointment 04/19/16.  Left message to call [facility] to schedule a new MD appt.

## 2016-05-09 ENCOUNTER — Encounter: Payer: Self-pay | Admitting: Infectious Disease

## 2016-06-03 ENCOUNTER — Encounter (HOSPITAL_COMMUNITY)
Admission: RE | Admit: 2016-06-03 | Discharge: 2016-06-03 | Disposition: A | Discharge status: Home / Self Care | Payer: Medicaid Other | Source: Ambulatory Visit | Attending: Nephrology | Admitting: Nephrology

## 2016-06-03 DIAGNOSIS — N189 Chronic kidney disease, unspecified: Secondary | ICD-10-CM | POA: Diagnosis not present

## 2016-06-03 DIAGNOSIS — D631 Anemia in chronic kidney disease: Secondary | ICD-10-CM | POA: Diagnosis present

## 2016-06-03 DIAGNOSIS — N179 Acute kidney failure, unspecified: Secondary | ICD-10-CM

## 2016-06-03 LAB — IRON AND TIBC
Iron: 85 ug/dL (ref 45–182)
Saturation Ratios: 29 % (ref 17.9–39.5)
TIBC: 298 ug/dL (ref 250–450)
UIBC: 213 ug/dL

## 2016-06-03 LAB — FERRITIN: FERRITIN: 440 ng/mL — AB (ref 24–336)

## 2016-06-03 LAB — POCT HEMOGLOBIN-HEMACUE: HEMOGLOBIN: 13.3 g/dL (ref 13.0–17.0)

## 2016-06-03 MED ORDER — EPOETIN ALFA 10000 UNIT/ML IJ SOLN: 10000.0000 [IU] | INTRAMUSCULAR | Status: DC

## 2016-06-08 ENCOUNTER — Other Ambulatory Visit: Payer: Medicaid Other

## 2016-06-08 ENCOUNTER — Ambulatory Visit: Payer: Medicaid Other

## 2016-06-08 ENCOUNTER — Encounter: Payer: Self-pay | Admitting: Infectious Disease

## 2016-06-08 ENCOUNTER — Other Ambulatory Visit: Payer: Self-pay | Admitting: *Deleted

## 2016-06-08 DIAGNOSIS — Z119 Encounter for screening for infectious and parasitic diseases, unspecified: Secondary | ICD-10-CM

## 2016-06-08 DIAGNOSIS — B20 Human immunodeficiency virus [HIV] disease: Secondary | ICD-10-CM

## 2016-06-08 LAB — COMPLETE METABOLIC PANEL WITH GFR
ALBUMIN: 2.5 g/dL — AB (ref 3.6–5.1)
ALK PHOS: 118 U/L — AB (ref 40–115)
ALT: 30 U/L (ref 9–46)
AST: 30 U/L (ref 10–40)
BILIRUBIN TOTAL: 0.2 mg/dL (ref 0.2–1.2)
BUN: 20 mg/dL (ref 7–25)
CO2: 24 mmol/L (ref 20–31)
Calcium: 8.5 mg/dL — ABNORMAL LOW (ref 8.6–10.3)
Chloride: 102 mmol/L (ref 98–110)
Creat: 2.21 mg/dL — ABNORMAL HIGH (ref 0.60–1.35)
GFR, Est African American: 45 mL/min — ABNORMAL LOW (ref >=60)
GFR, Est Non African American: 39 mL/min — ABNORMAL LOW (ref >=60)
GLUCOSE: 58 mg/dL — AB (ref 65–99)
Potassium: 4.1 mmol/L (ref 3.5–5.3)
SODIUM: 137 mmol/L (ref 135–146)
TOTAL PROTEIN: 7.7 g/dL (ref 6.1–8.1)

## 2016-06-08 LAB — CBC WITH DIFFERENTIAL/PLATELET
BASOS ABS: 0 {cells}/uL (ref 0–200)
Basophils Relative: 0 %
EOS PCT: 8 %
Eosinophils Absolute: 376 cells/uL (ref 15–500)
HCT: 41.6 % (ref 38.5–50.0)
HEMOGLOBIN: 13.5 g/dL (ref 13.2–17.1)
Lymphocytes Relative: 39 %
Lymphs Abs: 1833 cells/uL (ref 850–3900)
MCH: 29.5 pg (ref 27.0–33.0)
MCHC: 32.5 g/dL (ref 32.0–36.0)
MCV: 90.8 fL (ref 80.0–100.0)
MONO ABS: 611 {cells}/uL (ref 200–950)
MPV: 9.4 fL (ref 7.5–12.5)
Monocytes Relative: 13 %
NEUTROS ABS: 1880 {cells}/uL (ref 1500–7800)
Neutrophils Relative %: 40 %
Platelets: 430 10*3/uL — ABNORMAL HIGH (ref 140–400)
RBC: 4.58 MIL/uL (ref 4.20–5.80)
RDW: 15.9 % — ABNORMAL HIGH (ref 11.0–15.0)
WBC: 4.7 10*3/uL (ref 3.8–10.8)

## 2016-06-09 LAB — T-HELPER CELL (CD4) - (RCID CLINIC ONLY)
CD4 T CELL HELPER: 7 % — AB (ref 33–55)
CD4 T Cell Abs: 140 /uL — ABNORMAL LOW (ref 400–2700)

## 2016-06-10 ENCOUNTER — Ambulatory Visit (INDEPENDENT_AMBULATORY_CARE_PROVIDER_SITE_OTHER): Payer: Medicaid Other

## 2016-06-10 ENCOUNTER — Encounter (HOSPITAL_COMMUNITY): Payer: Self-pay | Admitting: Emergency Medicine

## 2016-06-10 ENCOUNTER — Ambulatory Visit (HOSPITAL_COMMUNITY)
Admission: EM | Admit: 2016-06-10 | Discharge: 2016-06-10 | Disposition: A | Discharge status: Home / Self Care | Payer: Medicaid Other | Attending: Emergency Medicine | Admitting: Emergency Medicine

## 2016-06-10 DIAGNOSIS — J181 Lobar pneumonia, unspecified organism: Secondary | ICD-10-CM | POA: Diagnosis not present

## 2016-06-10 DIAGNOSIS — J189 Pneumonia, unspecified organism: Secondary | ICD-10-CM

## 2016-06-10 LAB — RPR TITER

## 2016-06-10 LAB — RPR: RPR Ser Ql: REACTIVE — AB

## 2016-06-10 MED ORDER — AZITHROMYCIN 250 MG PO TABS: ORAL_TABLET | ORAL | 0 refills | Status: DC

## 2016-06-10 MED ORDER — CEFDINIR 300 MG PO CAPS: 300.0000 mg | ORAL_CAPSULE | Freq: Two times a day (BID) | ORAL | 0 refills | Status: DC

## 2016-06-10 NOTE — Discharge Instructions (Signed)
Take your antibiotics as directed until there all gone. Drink plenty of fluids and stay well-hydrated. May take ibuprofen for discomfort. Follow-up with your primary care doctor if you are not getting better early next week. He will also need a repeat chest x-ray in 3 weeks. See your doctor to have that ordered

## 2016-06-10 NOTE — ED Provider Notes (Signed)
CSN: [id number]     Arrival date & time 06/10/16  1226 History   First MD Initiated Contact with Patient 06/10/16 1445     Chief Complaint  Patient presents with  . URI   (Consider location/radiation/quality/duration/timing/severity/associated sxs/prior Treatment) 31 year old male with HIV states he had cold symptoms a little over 2 weeks ago. This was associated with cough followed by right lateral chest wall pain. He states he got over most of the cold symptoms and currently denies fever, shortness of breath and PND. He does have an occasional cough. He saw his PCP who work chest x-ray. When he got to the clinic that was one to do the x-ray they told him that they did not have the paper. The patient did not call his PCP to have reordered instead he came to the urgent care. Currently his only complaint is occasional pain to the right lateral chest.      Past Medical History:  Diagnosis Date  . Atypical chest pain 02/24/2016  . Bronchitis   . CKD (chronic kidney disease) stage 5, GFR less than 15 ml/min (HCC) 02/11/2016  . Hemorrhoids   . HIV (human immunodeficiency virus infection) ([place])   . Lower back pain 02/24/2016   Past Surgical History:  Procedure Laterality Date  . BASCILIC VEIN TRANSPOSITION Left 01/12/2016   Procedure: LEFT ARM FIRST STAGE [place];  Surgeon: Conrad Allenhurst, MD;  Location: [facility];  Service: Vascular;  Laterality: Left;  . IR GENERIC HISTORICAL  01/09/2016   IR US GUIDE VASC ACCESS RIGHT 01/09/2016 Marybelle Killings, MD [facility]  . IR GENERIC HISTORICAL  01/09/2016   IR FLUORO GUIDE CV LINE RIGHT 01/09/2016 Marybelle Killings, MD [facility]   History reviewed. No pertinent family history. Social History  Substance Use Topics  . Smoking status: Current Every Day Smoker    Packs/day: 0.20  . Smokeless tobacco: Never Used  . Alcohol use Yes     Comment: weekly    Review of Systems  Constitutional: Negative for activity change, fatigue and fever.   HENT: Negative.   Respiratory: Negative for shortness of breath and wheezing.        Occasional cough.  Cardiovascular: Positive for chest pain. Negative for leg swelling.  Gastrointestinal: Negative.   Genitourinary: Negative.   Musculoskeletal: Negative.   Neurological: Negative.   All other systems reviewed and are negative.   Allergies  Patient has no known allergies.  Home Medications   Prior to Admission medications   Medication Sig Start Date End Date Taking? Authorizing Provider  amLODipine (NORVASC) 5 MG tablet Take 1 tablet (5 mg total) by mouth at bedtime. 04/20/16  Yes Truman Hayward, MD  dapsone 100 MG tablet Take 1 tablet (100 mg total) by mouth daily. 01/23/16  Yes Truman Hayward, MD  furosemide (LASIX) 80 MG tablet TK 1 T PO BID 01/20/16  Yes Historical Provider, MD  abacavir-dolutegravir-lamiVUDine (TRIUMEQ) 600-50-300 MG tablet Take 1 tablet by mouth daily. 02/12/16   Truman Hayward, MD  Acetaminophen (TYLENOL EXTRA STRENGTH PO) Take by mouth.    Historical Provider, MD  azithromycin (ZITHROMAX) 250 MG tablet 2 tabs po on day one, then one tablet po once daily on days 2-5. 06/10/16   Janne Napoleon, NP  cefdinir (OMNICEF) 300 MG capsule Take 1 capsule (300 mg total) by mouth 2 (two) times daily. 06/10/16   Janne Napoleon, NP  SSD 1 % cream APPLY AA BID. 01/28/16   Historical  Provider, MD   Meds Ordered and Administered this Visit  Medications - No data to display  BP 135/85 (BP Location: Left Arm)   Pulse 78   Temp 98.3 F (36.8 C) (Oral)   Resp 16   SpO2 97%  No data found.   Physical Exam  Constitutional: He is oriented to person, place, and time. He appears well-developed and well-nourished. No distress.  HENT:  Head: Normocephalic and atraumatic.  Mouth/Throat: No oropharyngeal exudate.  Oropharynx with minor erythema and moderate amount of clear PND.  Eyes: EOM are normal. Pupils are equal, round, and reactive to light.  Neck: Normal range of  motion. Neck supple.  Cardiovascular: Normal rate, regular rhythm, normal heart sounds and intact distal pulses.   Pulmonary/Chest: Effort normal and breath sounds normal. No respiratory distress. He has no wheezes. He exhibits no tenderness.  Palpation and percussion of the right posterior lateral, lateral and anterolateral chest wall does not reveal tenderness. Lungs are clear. Good expansion.  Musculoskeletal: Normal range of motion.  Lymphadenopathy:    He has no cervical adenopathy.  Neurological: He is alert and oriented to person, place, and time.  Skin: Skin is warm and dry.  Psychiatric: He has a normal mood and affect.  Nursing note and vitals reviewed.   Urgent Care Course   Clinical Course     Procedures (including critical care time)  Labs Review Labs Reviewed - No data to display  Imaging Review Dg Chest 2 View  Result Date: 06/10/2016 CLINICAL DATA:  Cough EXAM: CHEST  2 VIEW COMPARISON:  January 10, 2016 FINDINGS: There is patchy infiltrate in the left base, primarily posteriorly. Lungs elsewhere clear. Heart size and pulmonary vascularity are normal. No adenopathy. There is thoracolumbar levoscoliosis. IMPRESSION: Patchy infiltrate left base, primarily posteriorly, felt to represent pneumonia. Lungs elsewhere clear. Stable cardiac silhouette. Followup PA and lateral chest radiographs recommended in 3-4 weeks following trial of antibiotic therapy to ensure resolution and exclude underlying malignancy. These results will be called to the ordering clinician or representative by the Radiologist Assistant, and communication documented in the PACS or zVision Dashboard. Electronically Signed   By: Lowella Grip III M.D.   On: 06/10/2016 15:26     Visual Acuity Review  Right Eye Distance:   Left Eye Distance:   Bilateral Distance:    Right Eye Near:   Left Eye Near:    Bilateral Near:         MDM   1. Community acquired pneumonia of left lower lobe of lung  ([place])    Take your antibiotics as directed until there all gone. Drink plenty of fluids and stay well-hydrated. May take ibuprofen for discomfort. Follow-up with your primary care doctor if you are not getting better early next week. He will also need a repeat chest x-ray in 3 weeks. See your doctor to have that ordered Meds ordered this encounter  Medications  . cefdinir (OMNICEF) 300 MG capsule    Sig: Take 1 capsule (300 mg total) by mouth 2 (two) times daily.    Dispense:  14 capsule    Refill:  0    Order Specific Question:   Supervising Provider    Answer:   Melony Overly [id number]  . azithromycin (ZITHROMAX) 250 MG tablet    Sig: 2 tabs po on day one, then one tablet po once daily on days 2-5.    Dispense:  6 tablet    Refill:  0    Order Specific  Question:   Supervising Provider    Answer:   Melony Overly [[id number]]       Janne Napoleon, NP 06/10/16 [phone]

## 2016-06-10 NOTE — ED Triage Notes (Signed)
Here for cold persistent cold sx onset 3 weeks associated w/prod cough, right rib pain  Denies fevers  A&O x4... NAD

## 2016-06-11 LAB — FLUORESCENT TREPONEMAL AB(FTA)-IGG-BLD: Fluorescent Treponemal ABS: REACTIVE — AB

## 2016-06-14 ENCOUNTER — Encounter: Payer: Self-pay | Admitting: *Deleted

## 2016-06-14 ENCOUNTER — Telehealth: Payer: Self-pay | Admitting: *Deleted

## 2016-06-14 ENCOUNTER — Telehealth: Payer: Self-pay | Admitting: Infectious Disease

## 2016-06-14 ENCOUNTER — Other Ambulatory Visit: Payer: Self-pay | Admitting: Infectious Disease

## 2016-06-14 DIAGNOSIS — B20 Human immunodeficiency virus [HIV] disease: Secondary | ICD-10-CM

## 2016-06-14 LAB — HIV-1 RNA QUANT-NO REFLEX-BLD
HIV 1 RNA Quant: 170448 copies/mL — ABNORMAL HIGH (ref <20)
HIV-1 RNA QUANT, LOG: 5.23 {Log_copies}/mL — AB (ref <1.30)

## 2016-06-14 NOTE — Telephone Encounter (Signed)
We need to emphasize that his virus being out of control he is putting his kidneys at risk again. He was preparing for HD before when his VL was above 200K and he had HIVAN and Cr of 9

## 2016-06-14 NOTE — Telephone Encounter (Signed)
Orders relayed to Dolan Amen. Attempted to reach out to patient - no answer. He is scheduled for follow up 1/22. Landis Gandy, RN

## 2016-06-14 NOTE — Telephone Encounter (Signed)
I followed up my call with a MyChart message. I'll keep trying.

## 2016-06-14 NOTE — Telephone Encounter (Signed)
Thanks Sharyn Lull, we may need to deploy Mitch. I believe he was being followed by Nephrology as well

## 2016-06-14 NOTE — Telephone Encounter (Signed)
Thanks Michelle

## 2016-06-14 NOTE — Progress Notes (Signed)
Adding genotype

## 2016-06-14 NOTE — Telephone Encounter (Signed)
-----   Message from Truman Hayward, MD sent at 06/14/2016 12:35 PM EST ----- Patient needs a pan genotypic genotype added. This is NOT good news for him. Do we know what happened?

## 2016-06-17 ENCOUNTER — Inpatient Hospital Stay (HOSPITAL_COMMUNITY): Admission: RE | Admit: 2016-06-17 | Payer: Medicaid Other | Source: Ambulatory Visit

## 2016-06-18 NOTE — Telephone Encounter (Signed)
I am trying to reach the patient via phone but his phone does not work to let him know of clinic being cancelled.

## 2016-06-21 ENCOUNTER — Ambulatory Visit: Payer: Medicaid Other | Admitting: Infectious Disease

## 2016-06-22 ENCOUNTER — Encounter (HOSPITAL_COMMUNITY)
Admission: RE | Admit: 2016-06-22 | Discharge: 2016-06-22 | Disposition: A | Discharge status: Home / Self Care | Payer: Medicaid Other | Source: Ambulatory Visit | Attending: Nephrology | Admitting: Nephrology

## 2016-06-22 DIAGNOSIS — N179 Acute kidney failure, unspecified: Secondary | ICD-10-CM

## 2016-06-22 DIAGNOSIS — N189 Chronic kidney disease, unspecified: Secondary | ICD-10-CM | POA: Diagnosis not present

## 2016-06-22 LAB — POCT HEMOGLOBIN-HEMACUE: HEMOGLOBIN: 13.6 g/dL (ref 13.0–17.0)

## 2016-06-22 MED ORDER — EPOETIN ALFA 10000 UNIT/ML IJ SOLN: 10000.0000 [IU] | INTRAMUSCULAR | Status: DC

## 2016-06-22 NOTE — Telephone Encounter (Signed)
Attempted to call patient again - no answer, no voicemail.

## 2016-06-23 NOTE — Telephone Encounter (Signed)
No response on his email either. DId he give [place] permission to talk to his Mom? I thought he had disclosed to her and she had come with him at one of his last visits. If anywhging would just want to get message we want to bring him in WHEN the clinic is open, next Tuesday  maybe?

## 2016-06-30 ENCOUNTER — Ambulatory Visit (INDEPENDENT_AMBULATORY_CARE_PROVIDER_SITE_OTHER): Payer: Medicaid Other | Admitting: Infectious Disease

## 2016-06-30 ENCOUNTER — Encounter: Payer: Self-pay | Admitting: Infectious Disease

## 2016-06-30 VITALS — BP 141/80 | HR 98 | Temp 98.0°F | Ht 72.0 in | Wt 186.0 lb

## 2016-06-30 DIAGNOSIS — Z23 Encounter for immunization: Secondary | ICD-10-CM

## 2016-06-30 DIAGNOSIS — B20 Human immunodeficiency virus [HIV] disease: Secondary | ICD-10-CM

## 2016-06-30 DIAGNOSIS — A523 Neurosyphilis, unspecified: Secondary | ICD-10-CM | POA: Diagnosis not present

## 2016-06-30 DIAGNOSIS — N08 Glomerular disorders in diseases classified elsewhere: Secondary | ICD-10-CM | POA: Diagnosis not present

## 2016-06-30 DIAGNOSIS — N179 Acute kidney failure, unspecified: Secondary | ICD-10-CM | POA: Diagnosis not present

## 2016-06-30 MED ORDER — AMLODIPINE BESYLATE 5 MG PO TABS: 5.0000 mg | ORAL_TABLET | Freq: Every day | ORAL | 5 refills | Status: DC

## 2016-06-30 MED ORDER — DAPSONE 100 MG PO TABS: 100.0000 mg | ORAL_TABLET | Freq: Every day | ORAL | 5 refills | Status: DC

## 2016-06-30 MED ORDER — MENINGOCOCCAL A C Y&W-135 OLIG IM SOLR
0.5000 mL | Freq: Once | INTRAMUSCULAR | Status: AC
  Administered 2016-06-30: 0.5 mL via INTRAMUSCULAR

## 2016-06-30 MED ORDER — FUROSEMIDE 80 MG PO TABS: 80.0000 mg | ORAL_TABLET | Freq: Every day | ORAL | 4 refills | Status: DC | PRN

## 2016-06-30 MED ORDER — ABACAVIR-DOLUTEGRAVIR-LAMIVUD 600-50-300 MG PO TABS: 1.0000 | ORAL_TABLET | Freq: Every day | ORAL | 5 refills | Status: DC

## 2016-06-30 MED FILL — DAPSONE 100 MG TABLET: 100 | 30 days supply | Qty: 30 | Fill #0

## 2016-06-30 MED FILL — FUROSEMIDE 80 MG TABLET: 80 | 30 days supply | Qty: 30 | Fill #0

## 2016-06-30 MED FILL — TRIUMEQ 600-50-300 MG TABS: 600-50-300 | 30 days supply | Qty: 30 | Fill #0

## 2016-06-30 MED FILL — AMLODIPINE BESYLATE 5 MG TA: 5 | 30 days supply | Qty: 30 | Fill #0

## 2016-06-30 NOTE — Progress Notes (Signed)
Chief complaint: Intermittent edema  Subjective:    Patient ID: Jonathon Moore, male    DOB: 03-04-1986, 31 y.o.   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  He underwent vascular surgery for dialysis but had not yet had this initiated.   He has received 2 weeks of intravenous penicillin for his neurosyphilis.  In the hospital I started him on a regimen of TIVICAY 50 mg daily with AZT 300 mg daily and Epivir 50 mg daily.  After discharge the hospital there was a mixup apparently in his medications and he actually came to our clinic on TIVICAY 50 mg daily and Combivir 1 tablet twice daily. He has has haven't changed to 2 tablets of Epzicom for 6 mg daily along with Epivir 50 mg and continued TIVICAY at 50 mg daily.   While following him post hospital discharge he had not only nearly suppressed his virus to undetectable levels and raised his CD4 above 300 but his serum creatinine had dropped from 9 to 2.58. We changed him to [facility] one pill once daily which he has been taking. His serum creatinine nearly normalized and his CD4 count rose as he maintained proper virological suppression. He disclosed his HIV status to his mother who accompanied him to his last visit. Since then he has: Out of care and not made further follow-up visits with [place] he finally had labs done a few weeks ago which showed his viral load to have skyrocketed above 170,000 CD4 count to have dropped into the 100s. His serum creatinine has also risen above 2 now.  He was seen in the ER and treated for possible pneumonia with oral cephalosporin he has not been taking his PCP prophylaxis either.  When asked why he has not been taking his antiretrovirals given his fairly vague and unconvincing answers regarding lack of refills etc.    Lab Results  Component Value Date   HIV1RNAQUANT 170,448 (H) 06/08/2016   HIV1RNAQUANT 98 (H) 03/25/2016   HIV1RNAQUANT 156 (H) 02/24/2016    Lab Results  Component Value Date   CD4TABS 140 (L) 06/08/2016   CD4TABS 220 (L) 03/25/2016   CD4TABS 370 (L) 02/11/2016    CMP Latest Ref Rng & Units 06/08/2016 03/25/2016 02/24/2016  Glucose 65 - 99 mg/dL 58(L) 127(H) 69  BUN 7 - 25 mg/dL $Remove'20 12 17  'JydtdyL$ Creatinine 0.60 - 1.35 mg/dL 2.21(H) 1.80(H) 1.92(H)  Sodium 135 - 146 mmol/L 137 135 135  Potassium 3.5 - 5.3 mmol/L 4.1 3.6 4.8  Chloride 98 - 110 mmol/L 102 100 103  CO2 20 - 31 mmol/L $RemoveB'24 24 23  'AmuibvGx$ Calcium 8.6 - 10.3 mg/dL 8.5(L) 8.6 8.6  Total Protein 6.1 - 8.1 g/dL 7.7 8.5(H) 7.6  Total Bilirubin 0.2 - 1.2 mg/dL 0.2 0.5 0.4  Alkaline Phos 40 - 115 U/L 118(H) 92 123(H)  AST 10 - 40 U/L $Remo'30 21 22  'KMEXO$ ALT 9 - 46 U/L $Remo'30 17 21      'RpuBo$ Past Medical History:  Diagnosis Date  . Atypical chest pain 02/24/2016  . Bronchitis   . CKD (chronic kidney disease) stage 5, GFR less than 15 ml/min (HCC) 02/11/2016  . Hemorrhoids   . HIV (human immunodeficiency virus infection) ([place])   . Lower back pain 02/24/2016    Past Surgical History:  Procedure Laterality Date  . BASCILIC VEIN TRANSPOSITION Left 01/12/2016   Procedure: LEFT  ARM FIRST STAGE BASCILIC VEIN TRANSPOSITION;  Surgeon: Fransisco Hertz, MD;  Location: [facility] OR;  Service: Vascular;  Laterality: Left;  . IR GENERIC HISTORICAL  01/09/2016   IR US GUIDE VASC ACCESS RIGHT 01/09/2016 Jolaine Click, MD [facility]  . IR GENERIC HISTORICAL  01/09/2016   IR FLUORO GUIDE CV LINE RIGHT 01/09/2016 Jolaine Click, MD [facility]    No family history on file.    Social History   Social History  . Marital status: Single    Spouse name: N/A  . Number of children: N/A  . Years of education: N/A   Social History Main Topics  . Smoking status: Former Smoker    Packs/day: 0.20  . Smokeless tobacco: Never Used  . Alcohol use Yes     Comment: rarely  . Drug use: No  . Sexual activity: No      Comment: given condoms   Other Topics Concern  . None   Social History Narrative  . None    No Known Allergies   Current Outpatient Prescriptions:  .  abacavir-dolutegravir-lamiVUDine (TRIUMEQ) 600-50-300 MG tablet, Take 1 tablet by mouth daily., Disp: 30 tablet, Rfl: 11 .  Acetaminophen (TYLENOL EXTRA STRENGTH PO), Take by mouth., Disp: , Rfl:  .  amLODipine (NORVASC) 5 MG tablet, Take 1 tablet (5 mg total) by mouth at bedtime., Disp: 30 tablet, Rfl: 5 .  cefdinir (OMNICEF) 300 MG capsule, Take 1 capsule (300 mg total) by mouth 2 (two) times daily., Disp: 14 capsule, Rfl: 0 .  dapsone 100 MG tablet, Take 1 tablet (100 mg total) by mouth daily., Disp: 30 tablet, Rfl: 5 .  furosemide (LASIX) 80 MG tablet, TK 1 T PO BID, Disp: , Rfl: 12 .  SSD 1 % cream, APPLY AA BID., Disp: , Rfl: 0   Review of Systems  Constitutional: Negative for chills, diaphoresis and fever.  HENT: Negative for congestion, hearing loss, sore throat and tinnitus.   Respiratory: Negative for cough, shortness of breath and wheezing.   Cardiovascular: Positive for leg swelling. Negative for palpitations.  Gastrointestinal: Negative for abdominal pain, blood in stool, constipation, diarrhea, nausea and vomiting.  Genitourinary: Negative for dysuria, flank pain and hematuria.  Musculoskeletal: Negative for myalgias.  Skin: Negative for rash.  Neurological: Negative for dizziness, weakness and headaches.  Hematological: Does not bruise/bleed easily.  Psychiatric/Behavioral: Negative for suicidal ideas.       Objective:   Physical Exam  Constitutional: He is oriented to person, place, and time. He appears well-developed and well-nourished. No distress.  HENT:  Head: Normocephalic and atraumatic.  Mouth/Throat: No oropharyngeal exudate.  Eyes: Conjunctivae and EOM are normal. No scleral icterus.  Neck: Normal range of motion. Neck supple.  Cardiovascular: Normal rate, regular rhythm and normal heart sounds.   Exam reveals no gallop and no friction rub.   No murmur heard. Pulmonary/Chest: Effort normal and breath sounds normal. No respiratory distress. He has no wheezes. He has no rales. He exhibits no tenderness.  Abdominal: He exhibits no distension.  Musculoskeletal: He exhibits edema. He exhibits no tenderness.  Neurological: He is alert and oriented to person, place, and time. He exhibits normal muscle tone. Coordination normal.  Skin: Skin is warm and dry. No rash noted. He is not diaphoretic. No erythema. No pallor.  Psychiatric: He has a normal mood and affect. His behavior is normal. Judgment and thought content normal.    AV site is clean       Assessment & Plan:  HIV/AIDS: He is really putting himself in danger with his noncompliance with his antiretrovirals. I told him that he is putting himself at risk of recurrence of his HIV-associated nephropathy and risk of being permanently on hemodialysis. Furthermore his immune system has become significantly weakened and he now again has AIDS. We have ordered restart his TRIUMEQ along with prophylactic dapsone but have meds filled through [facility] so we can track his adherence to filling his prescriptions  Neurosyphilis: Follow RPR titers of the next 4-6 months in thereafter. So far they've only; down 2 fold but has not helped himself in terms of his poor adherence to his antiretrovirals recently  HIVAN: remarkably his serum cr came down to near normal from prior levels above 9, back up above 2   Hypertension: Continue amlodipine he also takes Lasix as needed for lower extremity edema   We spent greater than 40 minutes with the patient including greater than 50% of time in face to face counsel of the patient and his mother re his HIV/AIDS, HIVAN neurosyphilis, HIVAN,  and in coordination of his care.

## 2016-06-30 NOTE — Progress Notes (Signed)
HPI: Jonathon Moore is a 31 y.o. male who presents to the [facility] today to follow-up with Dr. Tommy Medal. I saw him back in August after he was discharged from the hospital.   Allergies: No Known Allergies  Past Medical History: Past Medical History:  Diagnosis Date  . Atypical chest pain 02/24/2016  . Bronchitis   . CKD (chronic kidney disease) stage 5, GFR less than 15 ml/min (HCC) 02/11/2016  . Hemorrhoids   . HIV (human immunodeficiency virus infection) ([place])   . Lower back pain 02/24/2016    Social History: Social History   Social History  . Marital status: Single    Spouse name: N/A  . Number of children: N/A  . Years of education: N/A   Social History Main Topics  . Smoking status: Former Smoker    Packs/day: 0.20  . Smokeless tobacco: Never Used  . Alcohol use Yes     Comment: rarely  . Drug use: No  . Sexual activity: No     Comment: given condoms   Other Topics Concern  . None   Social History Narrative  . None    Current Regimen: Triumeq  Labs: HIV 1 RNA Quant (copies/mL)  Date Value  06/08/2016 170,448 (H)  03/25/2016 98 (H)  02/24/2016 156 (H)   CD4 T Cell Abs (/uL)  Date Value  06/08/2016 140 (L)  03/25/2016 220 (L)  02/11/2016 370 (L)   Hepatitis B Surface Ag (no units)  Date Value  01/03/2016 Negative    CrCl: CrCl cannot be calculated (Patient's most recent lab result is older than the maximum 21 days allowed.).  Lipids:    Component Value Date/Time   CHOL 174 01/03/2016 1549   TRIG 159 (H) 01/03/2016 1549   HDL 21 (L) 01/03/2016 1549   CHOLHDL 8.3 01/03/2016 1549   VLDL 32 01/03/2016 1549   LDLCALC 121 (H) 01/03/2016 1549    Assessment: Jonathon Moore comes today to follow-up for his HIV.  He was admitted to the hospital back in September with HIVAN.  His SCr was ~9 at that time and he was preparing to go on HD - he had a fistula placed and everything.  After he started his HIV medications, his SCr surprisingly  decreased all the way down to 1.8.  He did not continue on HD.  His HIV VL decreased all the way down to 98 back in October but when his labs were rechecked on 06/08/16, his VL was up to 170,000.  He tells me today that he has been off of his medications for quite some time and just did not get refills.    I explained the importance of taking his medications to him.  Explained that his SCr is up to 2.21 now and if he didn't start taking his Triumeq again, he could end up needing dialysis again.  His CD4 count has decreased down to 140.  He recently took [facility] for pneumonia after being seen in the ED ~2 weeks ago.  I will send his medications to [facility] so we can keep better track of him.  He has medicaid.  I counseled him on the importance of everything and gave him 2 pillbox keychains. He will f/u with me in ~2-3 weeks and we will recheck labs at that time.   Plans: - Continue Triumeq PO once daily - Start dapsone 100 mg PO daily - Send Triumeq, dapsone, amlodipine, and lasix to [facility] - F/u with me 2/19 at 3:30pm  Cassie L. Westley Gambles, PharmD Infectious Diseases [place] [facility] 06/30/2016, 3:46 PM

## 2016-07-06 ENCOUNTER — Encounter (HOSPITAL_COMMUNITY)
Admission: RE | Admit: 2016-07-06 | Discharge: 2016-07-06 | Disposition: A | Discharge status: Home / Self Care | Payer: Medicaid Other | Source: Ambulatory Visit | Attending: Nephrology | Admitting: Nephrology

## 2016-07-06 DIAGNOSIS — N189 Chronic kidney disease, unspecified: Secondary | ICD-10-CM | POA: Diagnosis not present

## 2016-07-06 DIAGNOSIS — D631 Anemia in chronic kidney disease: Secondary | ICD-10-CM | POA: Diagnosis present

## 2016-07-06 DIAGNOSIS — N179 Acute kidney failure, unspecified: Secondary | ICD-10-CM

## 2016-07-06 LAB — FERRITIN: Ferritin: 204 ng/mL (ref 24–336)

## 2016-07-06 LAB — POCT HEMOGLOBIN-HEMACUE: Hemoglobin: 13.8 g/dL (ref 13.0–17.0)

## 2016-07-06 LAB — IRON AND TIBC
IRON: 127 ug/dL (ref 45–182)
SATURATION RATIOS: 41 % — AB (ref 17.9–39.5)
TIBC: 312 ug/dL (ref 250–450)
UIBC: 185 ug/dL

## 2016-07-06 MED ORDER — EPOETIN ALFA 10000 UNIT/ML IJ SOLN: 10000.0000 [IU] | INTRAMUSCULAR | Status: DC

## 2016-07-14 DIAGNOSIS — Z0279 Encounter for issue of other medical certificate: Secondary | ICD-10-CM | POA: Diagnosis not present

## 2016-07-19 ENCOUNTER — Ambulatory Visit: Payer: Medicaid Other

## 2016-07-20 ENCOUNTER — Other Ambulatory Visit (HOSPITAL_COMMUNITY): Payer: Self-pay | Admitting: *Deleted

## 2016-07-21 ENCOUNTER — Encounter (HOSPITAL_COMMUNITY)
Admission: RE | Admit: 2016-07-21 | Discharge: 2016-07-21 | Disposition: A | Discharge status: Home / Self Care | Payer: Medicaid Other | Source: Ambulatory Visit | Attending: Nephrology | Admitting: Nephrology

## 2016-07-21 DIAGNOSIS — N179 Acute kidney failure, unspecified: Secondary | ICD-10-CM

## 2016-07-21 DIAGNOSIS — N189 Chronic kidney disease, unspecified: Secondary | ICD-10-CM | POA: Diagnosis not present

## 2016-07-21 LAB — POCT HEMOGLOBIN-HEMACUE: Hemoglobin: 13.9 g/dL (ref 13.0–17.0)

## 2016-07-21 MED ORDER — EPOETIN ALFA 10000 UNIT/ML IJ SOLN: 10000.0000 [IU] | INTRAMUSCULAR | Status: DC

## 2016-07-27 ENCOUNTER — Ambulatory Visit (INDEPENDENT_AMBULATORY_CARE_PROVIDER_SITE_OTHER): Payer: Medicaid Other | Admitting: Pharmacist

## 2016-07-27 DIAGNOSIS — B20 Human immunodeficiency virus [HIV] disease: Secondary | ICD-10-CM | POA: Diagnosis not present

## 2016-07-27 LAB — BASIC METABOLIC PANEL
BUN: 15 mg/dL (ref 7–25)
CALCIUM: 8.7 mg/dL (ref 8.6–10.3)
CO2: 26 mmol/L (ref 20–31)
CREATININE: 2.11 mg/dL — AB (ref 0.60–1.35)
Chloride: 107 mmol/L (ref 98–110)
Glucose, Bld: 68 mg/dL (ref 65–99)
Potassium: 4.2 mmol/L (ref 3.5–5.3)
SODIUM: 139 mmol/L (ref 135–146)

## 2016-07-27 MED FILL — TRIUMEQ 600-50-300 MG TABS: 600-50-300 | 30 days supply | Qty: 30 | Fill #1

## 2016-07-27 MED FILL — DAPSONE 100 MG TABLET: 100 | 30 days supply | Qty: 30 | Fill #1

## 2016-07-27 MED FILL — AMLODIPINE BESYLATE 5 MG TA: 5 | 30 days supply | Qty: 30 | Fill #1

## 2016-07-27 MED FILL — FUROSEMIDE 80 MG TABLET: 80 | 30 days supply | Qty: 30 | Fill #1

## 2016-07-27 NOTE — Progress Notes (Signed)
HPI: Jonathon Moore is a 31 y.o. male who presents to the [facility] today for follow-up of his HIV infection.  Allergies: No Known Allergies  Past Medical History: Past Medical History:  Diagnosis Date  . Atypical chest pain 02/24/2016  . Bronchitis   . CKD (chronic kidney disease) stage 5, GFR less than 15 ml/min (HCC) 02/11/2016  . Hemorrhoids   . HIV (human immunodeficiency virus infection) ([place])   . Lower back pain 02/24/2016    Social History: Social History   Social History  . Marital status: Single    Spouse name: N/A  . Number of children: N/A  . Years of education: N/A   Social History Main Topics  . Smoking status: Former Smoker    Packs/day: 0.20  . Smokeless tobacco: Never Used  . Alcohol use Yes     Comment: rarely  . Drug use: No  . Sexual activity: No     Comment: given condoms   Other Topics Concern  . Not on file   Social History Narrative  . No narrative on file    Current Regimen: Triumeq  Labs: HIV 1 RNA Quant (copies/mL)  Date Value  06/08/2016 170,448 (H)  03/25/2016 98 (H)  02/24/2016 156 (H)   CD4 T Cell Abs (/uL)  Date Value  06/08/2016 140 (L)  03/25/2016 220 (L)  02/11/2016 370 (L)   Hepatitis B Surface Ag (no units)  Date Value  01/03/2016 Negative    CrCl: CrCl cannot be calculated (Patient's most recent lab result is older than the maximum 21 days allowed.).  Lipids:    Component Value Date/Time   CHOL 174 01/03/2016 1549   TRIG 159 (H) 01/03/2016 1549   HDL 21 (L) 01/03/2016 1549   CHOLHDL 8.3 01/03/2016 1549   VLDL 32 01/03/2016 1549   LDLCALC 121 (H) 01/03/2016 1549    Assessment: Jonathon Moore is here today for HIV follow-up.  He recently restarted his Triumeq after he randomly stopped taking it.  He picked it up ~1 month ago.  He states he uses the pillbox key chain I gave him and it is very helpful.  He states he takes it in the evening before bedtime.  He has missed 1 dose since restarting  -- stated he forgot to take it when he got home from a friend's house. I encouraged him to not miss anymore doses and congratulated him for getting back on track. He is not having any issues with the medication - no nausea, vomiting, or headaches.  He does state that he gets diarrhea sometimes but it is not bothersome.   He gets his BP checked at [facility] when he goes to get labs and his injection for anemia.  I checked his BP today and it was 131/83, HR 76. Dr. Justin Mend manages his BP meds.  He has a f/u with Dr. Tommy Medal at end of March.  I will get labs today - HIV VL, CD4, and BMET.  I continued to counsel him on how important it was to take his medications, especially for his kidney health. He had a nursing visit for his 3rd Hep B shot 2 days prior to his f/u with Dr. Tommy Medal.  I cancelled his nursing visit and just told him he could get 3rd shot at Dr. Lucianne Lei Dam's visit. I also made an appointment for [facility].  Plans: - Continue Triumeq - HIV VL, CD4, BMET today - F/u with Grayland Ormond for counseling 3/8 at  9am - F/u with Dr. Tommy Medal 3/28 at 2pm  Cassie L. Kuppelweiser, PharmD, [place] for Infectious Disease 07/27/2016, 10:05 AM

## 2016-07-29 LAB — HIV-1 RNA QUANT-NO REFLEX-BLD
HIV 1 RNA QUANT: 1010 {copies}/mL — AB
HIV-1 RNA QUANT, LOG: 3 {Log_copies}/mL — AB

## 2016-07-29 LAB — T-HELPER CELL (CD4) - (RCID CLINIC ONLY)
CD4 T CELL HELPER: 11 % — AB (ref 33–55)
CD4 T Cell Abs: 200 /uL — ABNORMAL LOW (ref 400–2700)

## 2016-08-04 ENCOUNTER — Encounter (HOSPITAL_COMMUNITY): Payer: PRIVATE HEALTH INSURANCE

## 2016-08-05 ENCOUNTER — Ambulatory Visit: Payer: Medicaid Other

## 2016-08-23 ENCOUNTER — Ambulatory Visit: Payer: PRIVATE HEALTH INSURANCE

## 2016-08-25 ENCOUNTER — Ambulatory Visit (INDEPENDENT_AMBULATORY_CARE_PROVIDER_SITE_OTHER): Payer: Medicaid Other | Admitting: Infectious Disease

## 2016-08-25 ENCOUNTER — Encounter: Payer: Self-pay | Admitting: Infectious Disease

## 2016-08-25 ENCOUNTER — Other Ambulatory Visit (HOSPITAL_COMMUNITY)
Admission: RE | Admit: 2016-08-25 | Discharge: 2016-08-25 | Disposition: A | Discharge status: Home / Self Care | Payer: Medicaid Other | Source: Ambulatory Visit | Attending: Infectious Disease | Admitting: Infectious Disease

## 2016-08-25 VITALS — BP 134/73 | HR 91 | Temp 98.3°F | Wt 186.0 lb

## 2016-08-25 DIAGNOSIS — I10 Essential (primary) hypertension: Secondary | ICD-10-CM

## 2016-08-25 DIAGNOSIS — N08 Glomerular disorders in diseases classified elsewhere: Secondary | ICD-10-CM

## 2016-08-25 DIAGNOSIS — Z23 Encounter for immunization: Secondary | ICD-10-CM | POA: Diagnosis present

## 2016-08-25 DIAGNOSIS — B20 Human immunodeficiency virus [HIV] disease: Secondary | ICD-10-CM | POA: Diagnosis present

## 2016-08-25 DIAGNOSIS — A523 Neurosyphilis, unspecified: Secondary | ICD-10-CM

## 2016-08-25 DIAGNOSIS — N185 Chronic kidney disease, stage 5: Secondary | ICD-10-CM

## 2016-08-25 LAB — CBC WITH DIFFERENTIAL/PLATELET
BASOS ABS: 128 {cells}/uL (ref 0–200)
Basophils Relative: 2 %
EOS ABS: 2240 {cells}/uL — AB (ref 15–500)
Eosinophils Relative: 35 %
HEMATOCRIT: 41.7 % (ref 38.5–50.0)
HEMOGLOBIN: 14.1 g/dL (ref 13.2–17.1)
LYMPHS ABS: 1408 {cells}/uL (ref 850–3900)
Lymphocytes Relative: 22 %
MCH: 32.1 pg (ref 27.0–33.0)
MCHC: 33.8 g/dL (ref 32.0–36.0)
MCV: 95 fL (ref 80.0–100.0)
MPV: 9.6 fL (ref 7.5–12.5)
Monocytes Absolute: 576 cells/uL (ref 200–950)
Monocytes Relative: 9 %
NEUTROS PCT: 32 %
Neutro Abs: 2048 cells/uL (ref 1500–7800)
Platelets: 371 10*3/uL (ref 140–400)
RBC: 4.39 MIL/uL (ref 4.20–5.80)
RDW: 16.8 % — ABNORMAL HIGH (ref 11.0–15.0)
WBC: 6.4 10*3/uL (ref 3.8–10.8)

## 2016-08-25 MED ORDER — BICTEGRAVIR-EMTRICITAB-TENOFOV 50-200-25 MG PO TABS: 1.0000 | ORAL_TABLET | Freq: Every day | ORAL | 11 refills | Status: DC

## 2016-08-25 MED FILL — BIKTARVY 50-200-25 MG TABS: 50-200-25 | 30 days supply | Qty: 30 | Fill #0

## 2016-08-25 NOTE — Addendum Note (Signed)
Addended by: Rodman Key A on: 08/25/2016 05:19 PM   Modules accepted: Orders

## 2016-08-25 NOTE — Progress Notes (Signed)
Chief complaint: no new complaints  Subjective:    Patient ID: Jonathon Moore, male    DOB: 11-Jun-1985, 31 y.o.   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  He underwent vascular surgery for dialysis but had not yet had this initiated.   He has received 2 weeks of intravenous penicillin for his neurosyphilis.  In the hospital I started him on a regimen of TIVICAY 50 mg daily with AZT 300 mg daily and Epivir 50 mg daily.  After discharge the hospital there was a mixup apparently in his medications and he actually came to our clinic on TIVICAY 50 mg daily and Combivir 1 tablet twice daily. He has has haven't changed to 2 tablets of Epzicom for 6 mg daily along with Epivir 50 mg and continued TIVICAY at 50 mg daily.   While following him post hospital discharge he had not only nearly suppressed his virus to undetectable levels and raised his CD4 above 300 but his serum creatinine had dropped from 9 to 2.58. We changed him to [facility] one pill once daily which he has been taking. His serum creatinine nearly normalized and his CD4 count rose as he maintained proper virological suppression. He disclosed his HIV status to his mother who accompanied him to his last visit. Since then he has: Out of care and not made further follow-up visits with [place] he finally had labs done a few weeks ago which showed his viral load to have skyrocketed above 170,000 CD4 count to have dropped into the 100s. His serum creatinine had also risen above 2 now.  We switched him over to making sure he gets his meds from [facility] and VL came down to 1k and CD4 to 200. H e admits to still missing 1-2 doses per week. EMphasized tightening this up. He is interested in [facility] which I showed him due to smaller size pill.    Lab Results  Component Value Date   HIV1RNAQUANT 1,010 (H) 07/27/2016   HIV1RNAQUANT 170,448 (H) 06/08/2016   HIV1RNAQUANT 98 (H) 03/25/2016    Lab Results  Component Value Date   CD4TABS 200 (L) 07/27/2016   CD4TABS 140 (L) 06/08/2016   CD4TABS 220 (L) 03/25/2016    CMP Latest Ref Rng & Units 07/27/2016 06/08/2016 03/25/2016  Glucose 65 - 99 mg/dL 68 58(L) 127(H)  BUN 7 - 25 mg/dL $Remove'15 20 12  'giTuPUX$ Creatinine 0.60 - 1.35 mg/dL 2.11(H) 2.21(H) 1.80(H)  Sodium 135 - 146 mmol/L 139 137 135  Potassium 3.5 - 5.3 mmol/L 4.2 4.1 3.6  Chloride 98 - 110 mmol/L 107 102 100  CO2 20 - 31 mmol/L $RemoveB'26 24 24  'RpVHYVEA$ Calcium 8.6 - 10.3 mg/dL 8.7 8.5(L) 8.6  Total Protein 6.1 - 8.1 g/dL - 7.7 8.5(H)  Total Bilirubin 0.2 - 1.2 mg/dL - 0.2 0.5  Alkaline Phos 40 - 115 U/L - 118(H) 92  AST 10 - 40 U/L - 30 21  ALT 9 - 46 U/L - 30 17      Past Medical History:  Diagnosis Date  . Atypical chest pain 02/24/2016  . Bronchitis   . CKD (chronic kidney disease) stage 5, GFR less than 15 ml/min (HCC) 02/11/2016  . Hemorrhoids   . HIV (human immunodeficiency virus infection) ([place])   . Lower back pain 02/24/2016    Past Surgical History:  Procedure Laterality Date  . BASCILIC  VEIN TRANSPOSITION Left 01/12/2016   Procedure: LEFT ARM FIRST STAGE BASCILIC VEIN TRANSPOSITION;  Surgeon: Conrad Horace, MD;  Location: [place];  Service: Vascular;  Laterality: Left;  . IR GENERIC HISTORICAL  01/09/2016   IR US GUIDE VASC ACCESS RIGHT 01/09/2016 Marybelle Killings, MD [facility]  . IR GENERIC HISTORICAL  01/09/2016   IR FLUORO GUIDE CV LINE RIGHT 01/09/2016 Marybelle Killings, MD [facility]    No family history on file.    Social History   Social History  . Marital status: Single    Spouse name: N/A  . Number of children: N/A  . Years of education: N/A   Social History Main Topics  . Smoking status: Former Smoker    Packs/day: 0.20  . Smokeless tobacco: Never Used  . Alcohol use Yes     Comment: rarely  . Drug use: No  . Sexual activity: No     Comment: given  condoms   Other Topics Concern  . None   Social History Narrative  . None    No Known Allergies   Current Outpatient Prescriptions:  .  abacavir-dolutegravir-lamiVUDine (TRIUMEQ) 600-50-300 MG tablet, Take 1 tablet by mouth daily., Disp: 30 tablet, Rfl: 5 .  Acetaminophen (TYLENOL EXTRA STRENGTH PO), Take by mouth., Disp: , Rfl:  .  amLODipine (NORVASC) 5 MG tablet, Take 1 tablet (5 mg total) by mouth at bedtime., Disp: 30 tablet, Rfl: 5 .  dapsone 100 MG tablet, Take 1 tablet (100 mg total) by mouth daily., Disp: 30 tablet, Rfl: 5 .  furosemide (LASIX) 80 MG tablet, Take 1 tablet (80 mg total) by mouth daily as needed., Disp: 30 tablet, Rfl: 4   Review of Systems  Constitutional: Negative for chills, diaphoresis and fever.  HENT: Negative for congestion, hearing loss, sore throat and tinnitus.   Respiratory: Negative for cough, shortness of breath and wheezing.   Cardiovascular: Negative for palpitations.  Gastrointestinal: Negative for abdominal pain, blood in stool, constipation, diarrhea, nausea and vomiting.  Genitourinary: Negative for dysuria, flank pain and hematuria.  Musculoskeletal: Negative for myalgias.  Skin: Negative for rash.  Neurological: Negative for dizziness, weakness and headaches.  Hematological: Does not bruise/bleed easily.  Psychiatric/Behavioral: Negative for suicidal ideas.       Objective:   Physical Exam  Constitutional: He is oriented to person, place, and time. He appears well-developed and well-nourished. No distress.  HENT:  Head: Normocephalic and atraumatic.  Mouth/Throat: No oropharyngeal exudate.  Eyes: Conjunctivae and EOM are normal. No scleral icterus.  Neck: Normal range of motion. Neck supple.  Cardiovascular: Normal rate, regular rhythm and normal heart sounds.  Exam reveals no gallop and no friction rub.   No murmur heard. Pulmonary/Chest: Effort normal and breath sounds normal. No respiratory distress. He has no wheezes. He  has no rales. He exhibits no tenderness.  Abdominal: He exhibits no distension.  Musculoskeletal: He exhibits no tenderness.  Neurological: He is alert and oriented to person, place, and time. He exhibits normal muscle tone. Coordination normal.  Skin: Skin is warm and dry. No rash noted. He is not diaphoretic. No erythema. No pallor.  Psychiatric: He has a normal mood and affect. His behavior is normal. Judgment and thought content normal. His speech is delayed.    AV site is clean       Assessment & Plan:   HIV/AIDS: emphasized need to tighten up compliance  Check labs today  Change to BIKTARVY  RTC to see [facility] pharmacy in next  2-3 weeks with new meds, recheck VL and then see me in one month  Neurosyphilis: Follow RPR titers and screen for STI  HIVAN: remarkably his serum cr came down to near normal from prior levels above 9, back up above 2, seems to have stablized   Hypertension: Continue amlodipine but he is not always aderent> Emphasized importance of this as well esp re his kidney health  CKD cr stable  We spent greater than 40 minutes with the patient including greater than 50% of time in face to face counsel of the patient re his HIV/AIDS, HIVAN, his new ARV regimen neurosyphilis, HIVAN,  and in coordination of his care.

## 2016-08-25 NOTE — Progress Notes (Signed)
HPI: Jonathon Moore is a 31 y.o. male who presents to the [facility] today to follow-up with Dr. Tommy Medal for his HIV.   Allergies: No Known Allergies  Past Medical History: Past Medical History:  Diagnosis Date  . Atypical chest pain 02/24/2016  . Bronchitis   . CKD (chronic kidney disease) stage 5, GFR less than 15 ml/min (HCC) 02/11/2016  . Hemorrhoids   . HIV (human immunodeficiency virus infection) ([place])   . Lower back pain 02/24/2016    Social History: Social History   Social History  . Marital status: Single    Spouse name: N/A  . Number of children: N/A  . Years of education: N/A   Social History Main Topics  . Smoking status: Former Smoker    Packs/day: 0.20  . Smokeless tobacco: Never Used  . Alcohol use Yes     Comment: rarely  . Drug use: No  . Sexual activity: No     Comment: given condoms   Other Topics Concern  . None   Social History Narrative  . None    Current Regimen: Triumeq  Labs: HIV 1 RNA Quant (copies/mL)  Date Value  07/27/2016 1,010 (H)  06/08/2016 170,448 (H)  03/25/2016 98 (H)   CD4 T Cell Abs (/uL)  Date Value  07/27/2016 200 (L)  06/08/2016 140 (L)  03/25/2016 220 (L)   Hepatitis B Surface Ag (no units)  Date Value  01/03/2016 Negative    CrCl: CrCl cannot be calculated (Patient's most recent lab result is older than the maximum 21 days allowed.).  Lipids:    Component Value Date/Time   CHOL 174 01/03/2016 1549   TRIG 159 (H) 01/03/2016 1549   HDL 21 (L) 01/03/2016 1549   CHOLHDL 8.3 01/03/2016 1549   VLDL 32 01/03/2016 1549   LDLCALC 121 (H) 01/03/2016 1549    Assessment: Jonathon Moore is here today for HIV follow-up.  I have seen him several times for adherence counseling and monitoring.  He has been on Triumeq. He tells me he missed 2 doses in the last month.  I encouraged him to try to not miss any doses next month. We will change him to [facility] today for a smaller pill.  He wants to start it right  away.  We will mail it to his home from [facility].  I will follow back up with him in ~1 month.   Plans: - Stop Triumeq [employer] - F/u with me 4/23 at [place]. Kuppelweiser, PharmD, [place] for Infectious Disease 08/25/2016, 2:36 PM

## 2016-08-25 NOTE — Patient Instructions (Signed)
We will check blood work today  We will switch to [facility]  I would like you to see ID pharmacy with your new medication in the next 2-3 weeks.   WE can then recheck blood work and then have you come and see me in early May

## 2016-08-26 LAB — COMPLETE METABOLIC PANEL WITH GFR
ALBUMIN: 3 g/dL — AB (ref 3.6–5.1)
ALT: 18 U/L (ref 9–46)
AST: 21 U/L (ref 10–40)
Alkaline Phosphatase: 78 U/L (ref 40–115)
BILIRUBIN TOTAL: 0.8 mg/dL (ref 0.2–1.2)
BUN: 19 mg/dL (ref 7–25)
CALCIUM: 9.3 mg/dL (ref 8.6–10.3)
CHLORIDE: 106 mmol/L (ref 98–110)
CO2: 22 mmol/L (ref 20–31)
CREATININE: 1.9 mg/dL — AB (ref 0.60–1.35)
GFR, EST AFRICAN AMERICAN: 54 mL/min — AB (ref >=60)
GFR, Est Non African American: 47 mL/min — ABNORMAL LOW (ref >=60)
GLUCOSE: 80 mg/dL (ref 65–99)
POTASSIUM: 4.8 mmol/L (ref 3.5–5.3)
Sodium: 138 mmol/L (ref 135–146)
Total Protein: 7.7 g/dL (ref 6.1–8.1)

## 2016-08-26 LAB — CYTOLOGY, (ORAL, ANAL, URETHRAL) ANCILLARY ONLY
CHLAMYDIA, DNA PROBE: NEGATIVE
CHLAMYDIA, DNA PROBE: NEGATIVE
NEISSERIA GONORRHEA: NEGATIVE
Neisseria Gonorrhea: NEGATIVE

## 2016-08-26 LAB — T-HELPER CELL (CD4) - (RCID CLINIC ONLY)
CD4 % Helper T Cell: 12 % — ABNORMAL LOW (ref 33–55)
CD4 T CELL ABS: 280 /uL — AB (ref 400–2700)

## 2016-08-26 LAB — RPR: RPR Ser Ql: REACTIVE — AB

## 2016-08-26 LAB — RPR TITER: RPR Titer: 1:2 {titer}

## 2016-08-26 LAB — FLUORESCENT TREPONEMAL AB(FTA)-IGG-BLD: FLUORESCENT TREPONEMAL ABS: REACTIVE — AB

## 2016-08-27 LAB — HIV-1 RNA QUANT-NO REFLEX-BLD
HIV 1 RNA QUANT: 202 {copies}/mL — AB
HIV-1 RNA Quant, Log: 2.31 Log copies/mL — ABNORMAL HIGH

## 2016-09-20 ENCOUNTER — Ambulatory Visit: Payer: Medicaid Other

## 2016-10-18 ENCOUNTER — Ambulatory Visit (HOSPITAL_COMMUNITY)
Admission: EM | Admit: 2016-10-18 | Discharge: 2016-10-18 | Disposition: A | Discharge status: Home / Self Care | Payer: Medicaid Other | Attending: Family Medicine | Admitting: Family Medicine

## 2016-10-18 ENCOUNTER — Ambulatory Visit: Payer: Medicaid Other | Admitting: Infectious Disease

## 2016-10-18 ENCOUNTER — Encounter (HOSPITAL_COMMUNITY): Payer: Self-pay | Admitting: *Deleted

## 2016-10-18 DIAGNOSIS — R197 Diarrhea, unspecified: Secondary | ICD-10-CM | POA: Diagnosis not present

## 2016-10-18 DIAGNOSIS — L308 Other specified dermatitis: Secondary | ICD-10-CM

## 2016-10-18 DIAGNOSIS — R112 Nausea with vomiting, unspecified: Secondary | ICD-10-CM

## 2016-10-18 MED ORDER — ONDANSETRON 8 MG PO TBDP: 8.0000 mg | ORAL_TABLET | Freq: Three times a day (TID) | ORAL | 0 refills | Status: DC | PRN

## 2016-10-18 MED ORDER — TRIAMCINOLONE ACETONIDE 0.1 % EX CREA: 1.0000 "application " | TOPICAL_CREAM | Freq: Two times a day (BID) | CUTANEOUS | 1 refills | Status: DC

## 2016-10-18 MED FILL — TRIAMCINOLONE 0.1% CREAM: 0.1 | 30 days supply | Qty: 454 | Fill #0

## 2016-10-18 MED FILL — ONDANSETRON ODT 8 MG TABLET: 8 | 4 days supply | Qty: 12 | Fill #0

## 2016-10-18 NOTE — Discharge Instructions (Signed)
Avoid products containing latex, since this makes eczema worse.

## 2016-10-18 NOTE — ED Triage Notes (Signed)
Pt  Reports    Upper  abd  Pain  That  Started    Last  Night  Nausea     No  Vomiting  Reports   intermittant  Diarrhea   Started  yest

## 2016-10-18 NOTE — ED Provider Notes (Signed)
[place]    CSN: [id number] Arrival date & time: 10/18/16  1107     History   Chief Complaint Chief Complaint  Patient presents with  . Abdominal Pain    HPI Jonathon Moore is a 31 y.o. male.   Is a 31 year old man who presents to the [facility] urgent care center with complaints of abdominal pain. He has a history of renal failure and HIV disease. He is not currently under dialysis and has been taking his medicines regularly.  His problem began last night when he had some nausea and vomiting and was followed by diarrhea this morning. He's feeling better currently.  Patient has another problem which is eczema and has been particularly bad around his waist. It also has affected his right axilla but this is improved since he changed his deodorant.      Past Medical History:  Diagnosis Date  . Atypical chest pain 02/24/2016  . Bronchitis   . CKD (chronic kidney disease) stage 5, GFR less than 15 ml/min (HCC) 02/11/2016  . Hemorrhoids   . HIV (human immunodeficiency virus infection) ([place])   . Lower back pain 02/24/2016    Patient Active Problem List   Diagnosis Date Noted  . Atypical chest pain 02/24/2016  . Lower back pain 02/24/2016  . CKD (chronic kidney disease) stage 5, GFR less than 15 ml/min (HCC) 02/11/2016  . Wheezing   . Cough   . Headache   . Neurosyphilis   . AKI (acute kidney injury) ([place])   . AIDS (acquired immune deficiency syndrome) ([place])   . 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 Depression   . Normocytic anemia 01/04/2016  . Unintentional weight loss 01/04/2016  . Poor dentition 01/04/2016  . Acute renal failure ([place]) 01/03/2016  . Metabolic acidosis 25/95/6387  . Diarrhea 01/03/2016  . DOE (dyspnea on exertion)     Past Surgical History:  Procedure Laterality Date  . BASCILIC VEIN TRANSPOSITION Left 01/12/2016   Procedure: LEFT ARM FIRST STAGE [place];  Surgeon: Conrad Ramblewood, MD;  Location: [place];  Service: Vascular;  Laterality: Left;  . IR GENERIC HISTORICAL  01/09/2016   IR US GUIDE VASC ACCESS RIGHT 01/09/2016 Marybelle Killings, MD [facility]  . IR GENERIC HISTORICAL  01/09/2016   IR FLUORO GUIDE CV LINE RIGHT 01/09/2016 Marybelle Killings, MD [facility]       Home Medications    Prior to Admission medications   Medication Sig Start Date End Date Taking? Authorizing Provider  Acetaminophen (TYLENOL EXTRA STRENGTH PO) Take by mouth.    [provider]  amLODipine (NORVASC) 5 MG tablet Take 1 tablet (5 mg total) by mouth at bedtime. 06/30/16   Truman Hayward, MD  bictegravir-emtricitabine-tenofovir AF (BIKTARVY) 50-200-25 MG TABS tablet Take 1 tablet by mouth daily. 08/25/16   Truman Hayward, MD  dapsone 100 MG tablet Take 1 tablet (100 mg total) by mouth daily. 06/30/16   Truman Hayward, MD  furosemide (LASIX) 80 MG tablet Take 1 tablet (80 mg total) by mouth daily as needed. 06/30/16   Truman Hayward, MD  ondansetron (ZOFRAN-ODT) 8 MG disintegrating tablet Take 1 tablet (8 mg total) by mouth every 8 (eight) hours as needed for nausea. 10/18/16   Robyn Haber, MD  triamcinolone cream (KENALOG) 0.1 % Apply 1 application topically 2 (two) times daily. 10/18/16   Robyn Haber, MD    Family History History  reviewed. No pertinent family history.  Social History Social History  Substance Use Topics  . Smoking status: Former Smoker    Packs/day: 0.20  . Smokeless tobacco: Never Used  . Alcohol use Yes     Comment: rarely     Allergies   Patient has no known allergies.   Review of Systems Review of Systems  Gastrointestinal: Positive for abdominal pain, diarrhea and vomiting.  Skin: Positive for rash.  All other systems reviewed and are negative.    Physical Exam Triage Vital Signs ED Triage Vitals  Enc Vitals Group     BP 10/18/16 1119 (!) 157/83     Pulse Rate 10/18/16 1119 80     Resp 10/18/16 1119 16      Temp 10/18/16 1119 98.1 F (36.7 C)     Temp Source 10/18/16 1119 Oral     SpO2 10/18/16 1119 95 %     Weight --      Height --      Head Circumference --      Peak Flow --      Pain Score 10/18/16 1120 6     Pain Loc --      Pain Edu? --      Excl. in [place]? --    No data found.   Updated Vital Signs BP (!) 157/83 (BP Location: Right Arm) Comment: notified rn  Pulse 80   Temp 98.1 F (36.7 C) (Oral)   Resp 16   SpO2 95%    Physical Exam  Constitutional: He is oriented to person, place, and time. He appears well-developed and well-nourished.  HENT:  Right Ear: External ear normal.  Left Ear: External ear normal.  Mouth/Throat: Oropharynx is clear and moist.  Eyes: Conjunctivae are normal. Pupils are equal, round, and reactive to light.  Neck: Normal range of motion. Neck supple.  Cardiovascular: Normal rate.   Pulmonary/Chest: Effort normal.  Abdominal: Soft. Bowel sounds are normal. He exhibits no distension and no mass. There is no tenderness.  Musculoskeletal: Normal range of motion.  Patient has a subcutaneous shunt in his left arm  Neurological: He is alert and oriented to person, place, and time.  Skin: Skin is warm and dry.  Nursing note and vitals reviewed.    UC Treatments / Results  Labs (all labs ordered are listed, but only abnormal results are displayed) Labs Reviewed - No data to display  EKG  EKG Interpretation None       Radiology No results found.  Procedures Procedures (including critical care time)  Medications Ordered in UC Medications - No data to display   Initial Impression / Assessment and Plan / UC Course  I have reviewed the triage vital signs and the nursing notes.  Pertinent labs & imaging results that were available during my care of the patient were reviewed by me and considered in my medical decision making (see chart for details).     Final Clinical Impressions(s) / UC Diagnoses   Final diagnoses:  Nausea  vomiting and diarrhea  Other eczema    New Prescriptions New Prescriptions   ONDANSETRON (ZOFRAN-ODT) 8 MG DISINTEGRATING TABLET    Take 1 tablet (8 mg total) by mouth every 8 (eight) hours as needed for nausea.   TRIAMCINOLONE CREAM (KENALOG) 0.1 %    Apply 1 application topically 2 (two) times daily.     Robyn Haber, MD 10/18/16 1144

## 2016-10-28 MED FILL — AMLODIPINE BESYLATE 5 MG TA: 5 | 30 days supply | Qty: 30 | Fill #2

## 2016-10-28 MED FILL — DAPSONE 100 MG TABLET: 100 | 30 days supply | Qty: 30 | Fill #2

## 2016-10-28 MED FILL — FUROSEMIDE 80 MG TABLET: 80 | 30 days supply | Qty: 30 | Fill #2

## 2016-10-28 MED FILL — BIKTARVY 50-200-25 MG TABS: 50-200-25 | 30 days supply | Qty: 30 | Fill #1

## 2016-11-25 MED FILL — DAPSONE 100 MG TABLET: 100 | 30 days supply | Qty: 30 | Fill #3

## 2016-11-25 MED FILL — BIKTARVY 50-200-25 MG TABS: 50-200-25 | 30 days supply | Qty: 30 | Fill #2

## 2016-12-24 ENCOUNTER — Other Ambulatory Visit: Payer: Self-pay | Admitting: Pharmacist

## 2016-12-24 ENCOUNTER — Telehealth: Payer: Self-pay | Admitting: Pharmacist

## 2016-12-24 MED FILL — BIKTARVY 50-200-25 MG TABS: 50-200-25 | 30 days supply | Qty: 30 | Fill #3

## 2016-12-24 MED FILL — DAPSONE 100 MG TABLET: 100 | 30 days supply | Qty: 30 | Fill #4

## 2016-12-24 MED FILL — FUROSEMIDE 80 MG TABLET: 80 | 30 days supply | Qty: 30 | Fill #3

## 2016-12-24 MED FILL — AMLODIPINE BESYLATE 5 MG TA: 5 | 30 days supply | Qty: 30 | Fill #3

## 2016-12-24 NOTE — Telephone Encounter (Signed)
I have been trying to get a hold of Jonathon Moore for several weeks now.  He keeps missing his appointments with Dr. Tommy Medal.  He says he is doing well on the [place] - no side effects.  He missed a dose last week but other than that, he tells me he has been taking it pretty much every day. I am going to bring him in next week to get labs. Hopefully he will show up (he promised me).

## 2016-12-24 NOTE — Telephone Encounter (Signed)
Sounds like good plan

## 2016-12-28 ENCOUNTER — Ambulatory Visit (INDEPENDENT_AMBULATORY_CARE_PROVIDER_SITE_OTHER): Payer: Medicaid Other | Admitting: Pharmacist

## 2016-12-28 DIAGNOSIS — B20 Human immunodeficiency virus [HIV] disease: Secondary | ICD-10-CM

## 2016-12-28 LAB — CBC
HCT: 46.5 % (ref 38.5–50.0)
HEMOGLOBIN: 15.6 g/dL (ref 13.2–17.1)
MCH: 32.1 pg (ref 27.0–33.0)
MCHC: 33.5 g/dL (ref 32.0–36.0)
MCV: 95.7 fL (ref 80.0–100.0)
MPV: 9.5 fL (ref 7.5–12.5)
Platelets: 290 10*3/uL (ref 140–400)
RBC: 4.86 MIL/uL (ref 4.20–5.80)
RDW: 14.9 % (ref 11.0–15.0)
WBC: 4.1 10*3/uL (ref 3.8–10.8)

## 2016-12-28 MED ORDER — DAPSONE 100 MG PO TABS: 100.0000 mg | ORAL_TABLET | Freq: Every day | ORAL | 5 refills | Status: DC

## 2016-12-28 NOTE — Progress Notes (Signed)
HPI: Jonathon Moore is a 31 y.o. male who presents to the [facility] for HIV follow-up.   Allergies: No Known Allergies  Past Medical History: Past Medical History:  Diagnosis Date  . Atypical chest pain 02/24/2016  . Bronchitis   . CKD (chronic kidney disease) stage 5, GFR less than 15 ml/min (HCC) 02/11/2016  . Hemorrhoids   . HIV (human immunodeficiency virus infection) ([place])   . Lower back pain 02/24/2016    Social History: Social History   Social History  . Marital status: Single    Spouse name: N/A  . Number of children: N/A  . Years of education: N/A   Social History Main Topics  . Smoking status: Former Smoker    Packs/day: 0.20  . Smokeless tobacco: Never Used  . Alcohol use Yes     Comment: rarely  . Drug use: No  . Sexual activity: No     Comment: given condoms   Other Topics Concern  . Not on file   Social History Narrative  . No narrative on file    Current Regimen: Biktarvy  Labs: HIV 1 RNA Quant (copies/mL)  Date Value  08/25/2016 202 (H)  07/27/2016 1,010 (H)  06/08/2016 170,448 (H)   CD4 T Cell Abs (/uL)  Date Value  08/25/2016 280 (L)  07/27/2016 200 (L)  06/08/2016 140 (L)   Hepatitis B Surface Ag (no units)  Date Value  01/03/2016 Negative    CrCl: CrCl cannot be calculated (Patient's most recent lab result is older than the maximum 21 days allowed.).  Lipids:    Component Value Date/Time   CHOL 174 01/03/2016 1549   TRIG 159 (H) 01/03/2016 1549   HDL 21 (L) 01/03/2016 1549   CHOLHDL 8.3 01/03/2016 1549   VLDL 32 01/03/2016 1549   LDLCALC 121 (H) 01/03/2016 1549    Assessment: Jonathon Moore is here today to see me for HIV follow-up.  I have been working closely with him trying to help him with compliance and taking his medications for HIV.  He is also working closely with Audelia Hives.  He states he has missed about 5 doses in the last month.  He states he just forgets sometimes or is out and does not have it with  him.  He does have a pill box keychain but does not use it all that often.  Manuel and I gave him a larger pill box to help him remember.    I spent some time again going over the importance of taking medications for his HIV and resistance if he takes them on and off.  He was on the verge of dialysis ~1 year ago and I reminded him that he needs to take his Biktarvy in order for that not to happen again.  Audelia Hives is making a plan with him to set a daily reminder on his phone for taking his medications.  He will follow up with him in 2 weeks to see if it is helping.  If not, then they will come up with other strategies. He does get his medications through [facility], so I am keeping a close eye on him through them as well. I will get labs today and make a f/u with Dr. Tommy Medal. He states he will try his best to make the appt. He is not having any side effects from the Biktarvy - no night sweats, fevers, weight loss, chest pain, or SOB either.  He states he is also taking the dapsone as  well.  Plans: - Continue Biktarvy and dapsone - HIV RNA with resistance, CD4, CBC, CMET - F/u with Dr. Tommy Medal 9/12 at 330pm  Cassie L. Kuppelweiser, PharmD, [facility] 12/28/2016, 3:38 PM

## 2016-12-29 LAB — COMPREHENSIVE METABOLIC PANEL
ALBUMIN: 3.3 g/dL — AB (ref 3.6–5.1)
ALT: 24 U/L (ref 9–46)
AST: 21 U/L (ref 10–40)
Alkaline Phosphatase: 87 U/L (ref 40–115)
BILIRUBIN TOTAL: 1.3 mg/dL — AB (ref 0.2–1.2)
BUN: 13 mg/dL (ref 7–25)
CHLORIDE: 107 mmol/L (ref 98–110)
CO2: 21 mmol/L (ref 20–31)
CREATININE: 1.49 mg/dL — AB (ref 0.60–1.35)
Calcium: 8.3 mg/dL — ABNORMAL LOW (ref 8.6–10.3)
Glucose, Bld: 47 mg/dL — ABNORMAL LOW (ref 65–99)
Potassium: 4.1 mmol/L (ref 3.5–5.3)
SODIUM: 139 mmol/L (ref 135–146)
TOTAL PROTEIN: 7 g/dL (ref 6.1–8.1)

## 2016-12-29 LAB — LIPID PANEL
CHOL/HDL RATIO: 5 ratio — AB (ref <5.0)
Cholesterol: 198 mg/dL (ref <200)
HDL: 40 mg/dL — AB (ref >40)
LDL CALC: 130 mg/dL — AB (ref <100)
Triglycerides: 140 mg/dL (ref <150)
VLDL: 28 mg/dL (ref <30)

## 2016-12-29 LAB — T-HELPER CELL (CD4) - (RCID CLINIC ONLY)
CD4 T CELL HELPER: 13 % — AB (ref 33–55)
CD4 T Cell Abs: 230 /uL — ABNORMAL LOW (ref 400–2700)

## 2017-01-02 LAB — HIV RNA, RTPCR W/R GT (RTI, PI,INT)
HIV-1 RNA, QN PCR: 1090 {copies}/mL — AB
HIV-1 RNA, QN PCR: 3.04 {Log_copies}/mL — AB

## 2017-01-07 ENCOUNTER — Telehealth: Payer: Self-pay | Admitting: Pharmacist

## 2017-01-07 LAB — HIV-1 GENOTYPE: HIV-1 Genotype: DETECTED — AB

## 2017-01-07 NOTE — Progress Notes (Signed)
Ahh I figured this was going to happen.  Will bring him back in!

## 2017-01-07 NOTE — Telephone Encounter (Signed)
We also have integrase strand transfer resistance pending hopefully hasn't developed that as well

## 2017-01-07 NOTE — Telephone Encounter (Signed)
Called Jonathon Moore to come in so I can change his HIV medications since he has resistance now to tenofovir.  No answer, left message to call me back ASAP.

## 2017-01-08 LAB — RFLX HIV-1 INTEGRASE GENOTYPE

## 2017-01-10 NOTE — Telephone Encounter (Signed)
PHew!

## 2017-01-10 NOTE — Telephone Encounter (Signed)
Looks like no integrase resistance detected

## 2017-01-12 ENCOUNTER — Telehealth: Payer: Self-pay | Admitting: Pharmacist

## 2017-01-12 NOTE — Telephone Encounter (Signed)
Called Felipe again and no answer. Left another message to call me back ASAP.

## 2017-01-12 NOTE — Telephone Encounter (Signed)
Thanks Cassie, maybe we should deploy Mitch?

## 2017-01-18 ENCOUNTER — Telehealth: Payer: Self-pay | Admitting: Pharmacy Technician

## 2017-01-18 NOTE — Telephone Encounter (Signed)
Left VM, his Medicaid just became inactive.  [place] has been reaching out to talk with him about his needed refill.  He will now need medication assistance.

## 2017-01-19 NOTE — Telephone Encounter (Signed)
I asked Mitch this morning to see if he can help.Everette Rank get Audelia Hives too!

## 2017-01-19 NOTE — Telephone Encounter (Signed)
thx Cassie!!

## 2017-01-19 NOTE — Telephone Encounter (Signed)
uggh lets let Jonathon Moore and Jonathon Moore know abbout him

## 2017-01-19 NOTE — Telephone Encounter (Signed)
He also lost his Medicaid... So Jonathon Moore has been trying to get in touch with him as well.

## 2017-02-09 ENCOUNTER — Ambulatory Visit (INDEPENDENT_AMBULATORY_CARE_PROVIDER_SITE_OTHER): Payer: Self-pay | Admitting: Infectious Disease

## 2017-02-09 ENCOUNTER — Encounter: Payer: Self-pay | Admitting: Infectious Disease

## 2017-02-09 VITALS — BP 136/79 | HR 87 | Temp 97.8°F | Wt 182.0 lb

## 2017-02-09 DIAGNOSIS — Z91199 Patient's noncompliance with other medical treatment and regimen due to unspecified reason: Secondary | ICD-10-CM

## 2017-02-09 DIAGNOSIS — N185 Chronic kidney disease, stage 5: Secondary | ICD-10-CM

## 2017-02-09 DIAGNOSIS — B20 Human immunodeficiency virus [HIV] disease: Secondary | ICD-10-CM

## 2017-02-09 DIAGNOSIS — Z23 Encounter for immunization: Secondary | ICD-10-CM

## 2017-02-09 DIAGNOSIS — Z9119 Patient's noncompliance with other medical treatment and regimen: Secondary | ICD-10-CM

## 2017-02-09 DIAGNOSIS — A523 Neurosyphilis, unspecified: Secondary | ICD-10-CM

## 2017-02-09 DIAGNOSIS — N08 Glomerular disorders in diseases classified elsewhere: Secondary | ICD-10-CM

## 2017-02-09 HISTORY — DX: Patient's noncompliance with other medical treatment and regimen due to unspecified reason: Z91.199

## 2017-02-09 HISTORY — DX: Patient's noncompliance with other medical treatment and regimen: Z91.19

## 2017-02-09 MED ORDER — DARUN-COBIC-EMTRICIT-TENOFAF 800-150-200-10 MG PO TABS: 1.0000 | ORAL_TABLET | Freq: Every day | ORAL | 3 refills | Status: DC

## 2017-02-09 MED ORDER — DOLUTEGRAVIR SODIUM 50 MG PO TABS: 50.0000 mg | ORAL_TABLET | Freq: Every day | ORAL | 3 refills | Status: DC

## 2017-02-09 NOTE — Progress Notes (Signed)
Chief complaint: no new complaints  Subjective:    Chief complaint: followup for HIV having now failed with resistance   Patient ID: Jonathon Moore, male    DOB: 16-Apr-1986, 31 y.o.   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  He underwent vascular surgery for dialysis.  He had received 2 weeks of intravenous penicillin for his neurosyphilis.  In the hospital I started him on a regimen of TIVICAY 50 mg daily with AZT 300 mg daily and Epivir 50 mg daily.  After discharge the hospital there was a mixup apparently in his medications and he actually came to our clinic on TIVICAY 50 mg daily and Combivir 1 tablet twice daily. He has has haven't changed to 2 tablets of Epzicom for 6 mg daily along with Epivir 50 mg and continued TIVICAY at 50 mg daily.   While following him post hospital discharge he had not only nearly suppressed his virus to undetectable levels and raised his CD4 above 300 but his serum creatinine had dropped from 9 to 2.58. We changed him to [facility] one pill once daily which he has been taking. His serum creatinine nearly normalized and his CD4 count rose as he maintained proper virological suppression.     He then fell out of care and had  not made further follow-up visits with [place] he finally had labs done a few weeks ago which showed his viral load to have skyrocketed above 170,000 CD4 count to have dropped into the 100s. His serum creatinine had also risen above 2.  We switched him over to making sure he gets his meds from [facility] and VL came down to 1k and CD4 to 200. He was admitted to still missing 1-2 doses per week. EMphasized tightening this up.   We then changed him to [facility].  Since the had had VIROLOGICAL FAILURE W RESISTANCE and K70.  He may have even further R since he has not had  further labs and we could not get in touch with him effectively to ensure that he have his regimen changed.  Audelia Hives met with him as well as Cassie.  We will change him to [place] which we will procure by doing [employer] since he lost Medicaid.     Lab Results  Component Value Date   HIV1RNAQUANT 202 (H) 08/25/2016   HIV1RNAQUANT 1,010 (H) 07/27/2016   HIV1RNAQUANT 170,448 (H) 06/08/2016    Lab Results  Component Value Date   CD4TABS 230 (L) 12/28/2016   CD4TABS 280 (L) 08/25/2016   CD4TABS 200 (L) 07/27/2016    CMP Latest Ref Rng & Units 12/28/2016 08/25/2016 07/27/2016  Glucose 65 - 99 mg/dL 47(L) 80 68  BUN 7 - 25 mg/dL 13 19 15   Creatinine 0.60 - 1.35 mg/dL 1.49(H) 1.90(H) 2.11(H)  Sodium 135 - 146 mmol/L 139 138 139  Potassium 3.5 - 5.3 mmol/L 4.1 4.8 4.2  Chloride 98 - 110 mmol/L 107 106 107  CO2 20 - 31 mmol/L 21 22 26   Calcium 8.6 - 10.3 mg/dL 8.3(L) 9.3 8.7  Total Protein 6.1 - 8.1 g/dL 7.0 7.7 -  Total Bilirubin 0.2 - 1.2 mg/dL 1.3(H) 0.8 -  Alkaline Phos 40 - 115 U/L 87 78 -  AST 10 - 40 U/L 21 21 -  ALT 9 - 46 U/L 24 18 -  Past Medical History:  Diagnosis Date  . Atypical chest pain 02/24/2016  . Bronchitis   . CKD (chronic kidney disease) stage 5, GFR less than 15 ml/min (HCC) 02/11/2016  . Hemorrhoids   . HIV (human immunodeficiency virus infection) ([place])   . Lower back pain 02/24/2016    Past Surgical History:  Procedure Laterality Date  . BASCILIC VEIN TRANSPOSITION Left 01/12/2016   Procedure: LEFT ARM FIRST STAGE [place];  Surgeon: Conrad [place], MD;  Location: [facility];  Service: Vascular;  Laterality: Left;  . IR GENERIC HISTORICAL  01/09/2016   IR US GUIDE VASC ACCESS RIGHT 01/09/2016 Marybelle Killings, MD [facility]INTER[facility]  . IR GENERIC HISTORICAL  01/09/2016   IR FLUORO GUIDE CV LINE RIGHT 01/09/2016 Marybelle Killings, MD [facility]    No family history on file.    Social History   Social History  . Marital status:  Single    Spouse name: N/A  . Number of children: N/A  . Years of education: N/A   Social History Main Topics  . Smoking status: Former Smoker    Packs/day: 0.20  . Smokeless tobacco: Never Used  . Alcohol use Yes     Comment: rarely  . Drug use: No  . Sexual activity: No     Comment: given condoms   Other Topics Concern  . None   Social History Narrative  . None    No Known Allergies   Current Outpatient Prescriptions:  .  Acetaminophen (TYLENOL EXTRA STRENGTH PO), Take by mouth., Disp: , Rfl:  .  amLODipine (NORVASC) 5 MG tablet, Take 1 tablet (5 mg total) by mouth at bedtime., Disp: 30 tablet, Rfl: 5 .  furosemide (LASIX) 80 MG tablet, Take 1 tablet (80 mg total) by mouth daily as needed., Disp: 30 tablet, Rfl: 4 .  ondansetron (ZOFRAN-ODT) 8 MG disintegrating tablet, Take 1 tablet (8 mg total) by mouth every 8 (eight) hours as needed for nausea., Disp: 12 tablet, Rfl: 0 .  triamcinolone cream (KENALOG) 0.1 %, Apply 1 application topically 2 (two) times daily., Disp: 453 g, Rfl: 1 .  Darunavir-Cobicisctat-Emtricitabine-Tenofovir Alafenamide (SYMTUZA) 800-150-200-10 MG TABS, Take 1 tablet by mouth daily with breakfast., Disp: 30 tablet, Rfl: 3 .  dolutegravir (TIVICAY) 50 MG tablet, Take 1 tablet (50 mg total) by mouth daily., Disp: 30 tablet, Rfl: 3   Review of Systems  Constitutional: Negative for chills, diaphoresis and fever.  HENT: Negative for congestion, hearing loss, sore throat and tinnitus.   Respiratory: Negative for cough, shortness of breath and wheezing.   Cardiovascular: Negative for palpitations.  Gastrointestinal: Negative for abdominal pain, blood in stool, constipation, diarrhea, nausea and vomiting.  Genitourinary: Negative for dysuria, flank pain and hematuria.  Musculoskeletal: Negative for myalgias.  Skin: Negative for rash.  Neurological: Negative for dizziness, weakness and headaches.  Hematological: Does not bruise/bleed easily.    Psychiatric/Behavioral: Positive for dysphoric mood. Negative for suicidal ideas.       Objective:   Physical Exam  Constitutional: He is oriented to person, place, and time. He appears well-developed and well-nourished. No distress.  HENT:  Head: Normocephalic and atraumatic.  Mouth/Throat: No oropharyngeal exudate.  Eyes: Conjunctivae and EOM are normal. No scleral icterus.  Neck: Normal range of motion. Neck supple.  Cardiovascular: Normal rate, regular rhythm and normal heart sounds.  Exam reveals no gallop and no friction rub.   No murmur heard. Pulmonary/Chest: Effort normal and breath sounds normal. No respiratory distress. He has no  wheezes. He has no rales. He exhibits no tenderness.  Abdominal: He exhibits no distension.  Musculoskeletal: He exhibits no tenderness.  Neurological: He is alert and oriented to person, place, and time. He exhibits normal muscle tone. Coordination normal.  Skin: Skin is warm and dry. No rash noted. He is not diaphoretic. No erythema. No pallor.  Psychiatric: His behavior is normal. His speech is delayed. He exhibits a depressed mood.    AV site is clean       Assessment & Plan:   HIV/AIDS: change to [facility] and [place]. Recheck labs in interim and check genotype for INSTI R and NRTI R    RTC to se  ID pharmacy in next 2weeks  See me in 5 weeks  Neurosyphilis: Follow RPR titers   HIVAN: with his poor adherence and virological failure now on a more complicated regimen having failed STR that were highly tolerable there is risk for further poor adherence and immunological failrue and of course HIVAN from HIV itself and comorbid poorly controlled HTN   Hypertension: assuredly not taking these medications well either    CKD: we will hope his HIVAN does not recur BMP Latest Ref Rng & Units 12/28/2016 08/25/2016 07/27/2016  Glucose 65 - 99 mg/dL 47(L) 80 68  BUN 7 - 25 mg/dL 13 19 15   Creatinine 0.60 - 1.35 mg/dL 1.49(H) 1.90(H) 2.11(H)   Sodium 135 - 146 mmol/L 139 138 139  Potassium 3.5 - 5.3 mmol/L 4.1 4.8 4.2  Chloride 98 - 110 mmol/L 107 106 107  CO2 20 - 31 mmol/L 21 22 26   Calcium 8.6 - 10.3 mg/dL 8.3(L) 9.3 8.7      I spent greater than 40 minutes with the patient including greater than 50% of time in face to face counsel of the patient re the consquences of poor adherence with virological failure and LOSS of future options for treatment requiring a more complicated regimen, going over his new regimen and in coordination of his care with ID pharmacy, case management and bridge counselor.

## 2017-02-09 NOTE — Progress Notes (Signed)
HPI: Jonathon Moore is a 31 y.o. male who presents to the [facility] clinic to follow-up with Dr. Tommy Medal for his HIV infection.   Allergies: No Known Allergies  Past Medical History: Past Medical History:  Diagnosis Date  . Atypical chest pain 02/24/2016  . Bronchitis   . CKD (chronic kidney disease) stage 5, GFR less than 15 ml/min (HCC) 02/11/2016  . Hemorrhoids   . HIV (human immunodeficiency virus infection) ([place])   . Lower back pain 02/24/2016    Social History: Social History   Social History  . Marital status: Single    Spouse name: N/A  . Number of children: N/A  . Years of education: N/A   Social History Main Topics  . Smoking status: Former Smoker    Packs/day: 0.20  . Smokeless tobacco: Never Used  . Alcohol use Yes     Comment: rarely  . Drug use: No  . Sexual activity: No     Comment: given condoms   Other Topics Concern  . None   Social History Narrative  . None    Current Regimen: Biktarvy  Labs: HIV 1 RNA Quant (copies/mL)  Date Value  08/25/2016 202 (H)  07/27/2016 1,010 (H)  06/08/2016 170,448 (H)   CD4 T Cell Abs (/uL)  Date Value  12/28/2016 230 (L)  08/25/2016 280 (L)  07/27/2016 200 (L)   Hepatitis B Surface Ag (no units)  Date Value  01/03/2016 Negative    CrCl: CrCl cannot be calculated (Patient's most recent lab result is older than the maximum 21 days allowed.).  Lipids:    Component Value Date/Time   CHOL 198 12/28/2016 1538   TRIG 140 12/28/2016 1538   HDL 40 (L) 12/28/2016 1538   CHOLHDL 5.0 (H) 12/28/2016 1538   VLDL 28 12/28/2016 1538   LDLCALC 130 (H) 12/28/2016 1538    Assessment: Jonathon Moore is here today to follow-up with Dr. Tommy Medal for his HIV infection. 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 I have been trying to get in touch with him but  have not been able to reach him.  I was also alerted from the pharmacy that his Medicaid has run out.  Luckily, he showed up for his appointment today.  Dr. Tommy Medal will change him to [place] since he is unreliable with taking his medications and has resistance now. He still tells me today that he was taking his medications but after I told him about his labs, he did state that he was spotty with adherence in the previous months. I told him that was why he developed resistance and that if he does it again, he could develop more and will really be in trouble.  I also cautioned him about his kidney function if he doesn't start taking his medications reliably.  His SCr was 2.21 back in January, 1.90 in March and down to 1.49 in July.  He states he will be better and that he actually does use the pill box I gave him.  Since his Medicaid is inactive, Caryl Pina will get him [employer] and then he will apply for [employer]. He will come back and see me in 2-3 weeks.   Plans: - Stop Biktarvy - Start Symtuza PO once daily - Start Tivicay 50 mg PO once  daily - F/u with me 10/1 at 330pm  Beverley Allender L. Ameri Cahoon, PharmD, [facility] for [facility] 02/09/2017, 4:35 PM

## 2017-02-15 LAB — HIV RNA, RTPCR W/R GT (RTI, PI,INT)
HIV 1 RNA Quant: <20 copies/mL
HIV-1 RNA QUANT, LOG: NOT DETECTED {Log_copies}/mL

## 2017-02-17 ENCOUNTER — Other Ambulatory Visit: Payer: Self-pay | Admitting: Pharmacist

## 2017-02-28 ENCOUNTER — Telehealth: Payer: Self-pay | Admitting: Pharmacist

## 2017-02-28 ENCOUNTER — Ambulatory Visit: Payer: Self-pay

## 2017-02-28 NOTE — Telephone Encounter (Signed)
Thank God!

## 2017-02-28 NOTE — Telephone Encounter (Signed)
Well [place] thanks so much Cassie!!

## 2017-02-28 NOTE — Telephone Encounter (Signed)
It is VERY strange because his VL is suppressed in mid September. I wonder if he has meds stored up? Have we tried to deploy Ambre or Karn Pickler on his case?

## 2017-02-28 NOTE — Telephone Encounter (Signed)
I've asked both of them to get involved. Jonathon Moore is involved too!

## 2017-02-28 NOTE — Telephone Encounter (Signed)
Hiroto is supposed to come and see me today but upon further investigation, he never brought in his pay stubs for [employer] or [employer]. He inquired last week about where his mediations were.  I had Audelia Hives text him this morning and tell him to bring pay stubs with him this afternoon, otherwise, we cannot help him.  He states he has been trying to get pay stubs from his employer but they will not give them to him.  I don't necessarily believe him. Not sure what else we can do to help him at this point.

## 2017-02-28 NOTE — Telephone Encounter (Signed)
He actually showed up to clinic today with his pay stubs! Coming back to see me in 2 weeks.

## 2017-03-03 ENCOUNTER — Encounter: Payer: Self-pay | Admitting: Infectious Disease

## 2017-03-14 ENCOUNTER — Ambulatory Visit: Payer: Self-pay

## 2017-03-16 ENCOUNTER — Ambulatory Visit: Payer: Self-pay | Admitting: Infectious Disease

## 2017-03-29 ENCOUNTER — Encounter: Payer: Self-pay | Admitting: Infectious Disease

## 2017-03-29 ENCOUNTER — Other Ambulatory Visit: Payer: Self-pay | Admitting: *Deleted

## 2017-03-29 DIAGNOSIS — B20 Human immunodeficiency virus [HIV] disease: Secondary | ICD-10-CM

## 2017-03-29 MED ORDER — DARUN-COBIC-EMTRICIT-TENOFAF 800-150-200-10 MG PO TABS: 1.0000 | ORAL_TABLET | Freq: Every day | ORAL | 5 refills | Status: DC

## 2017-03-29 MED ORDER — DOLUTEGRAVIR SODIUM 50 MG PO TABS
50.0000 mg | ORAL_TABLET | Freq: Every day | ORAL | 5 refills | Status: DC
Start: 2017-03-29 — End: 2017-04-05

## 2017-04-05 ENCOUNTER — Telehealth: Payer: Self-pay | Admitting: Pharmacist

## 2017-04-05 ENCOUNTER — Other Ambulatory Visit: Payer: Self-pay | Admitting: Pharmacist

## 2017-04-05 DIAGNOSIS — B20 Human immunodeficiency virus [HIV] disease: Secondary | ICD-10-CM

## 2017-04-05 MED ORDER — DOLUTEGRAVIR SODIUM 50 MG PO TABS: 50.0000 mg | ORAL_TABLET | Freq: Every day | ORAL | 5 refills | Status: DC

## 2017-04-05 MED ORDER — DARUNAVIR-COBICISTAT 800-150 MG PO TABS: 1.0000 | ORAL_TABLET | Freq: Every day | ORAL | 5 refills | Status: DC

## 2017-04-05 MED ORDER — EMTRICITABINE-TENOFOVIR AF 200-25 MG PO TABS: 1.0000 | ORAL_TABLET | Freq: Every day | ORAL | 5 refills | Status: DC

## 2017-04-05 NOTE — Telephone Encounter (Signed)
Jonathon Moore called me needing a refill of his HIV medications.  He is approved for HMAP now, so I had to change his Symtuza to Prezcobix + Descovy.  Explained to him and he agrees.  He will be on Prezcobix, Descovy, and Tivicay.  I sent them to [facility] on [address] and he will pick up today.

## 2017-04-05 NOTE — Telephone Encounter (Signed)
Excellent too bad HMAP didn't approve SYMTUZA

## 2017-05-06 ENCOUNTER — Other Ambulatory Visit: Payer: Self-pay | Admitting: Pharmacist

## 2017-05-17 ENCOUNTER — Ambulatory Visit: Payer: Self-pay

## 2017-06-07 IMAGING — CT CT HEAD W/O CM
3 of 4 series · 16 of 47 positions shown, 19 images · non-contrast
Comparison: 11/11/2009

CLINICAL DATA: Headaches

EXAM:
CT HEAD WITHOUT CONTRAST
TECHNIQUE: Contiguous axial images were obtained from the base of the skull
through the vertex without intravenous contrast.

[Series 3: head 2.0 h70h · axial · 0.44mm/px · z∈[-104,+46]mm · 10 of 85 slices shown, 13 images]
[im 5/85  brain]
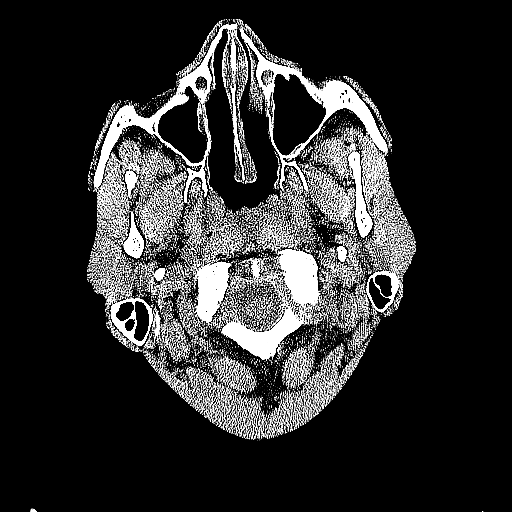
[im 5/85  bone]
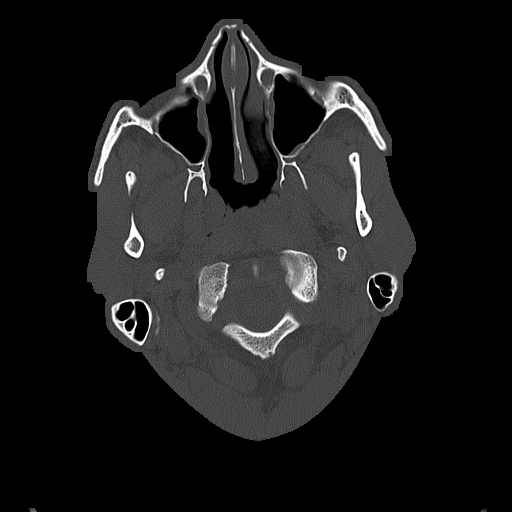
[im 13/85  brain]
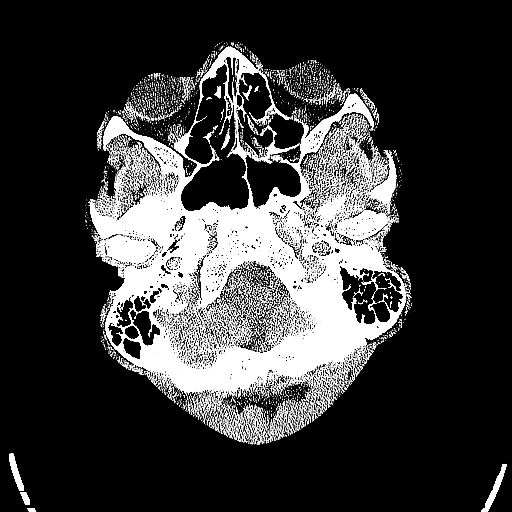
[im 22/85  brain]
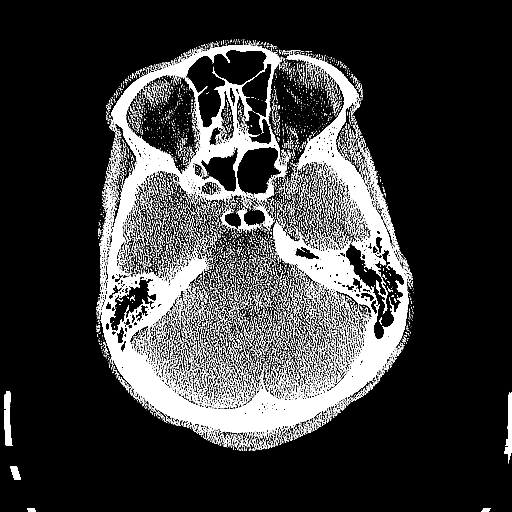
[im 30/85  brain]
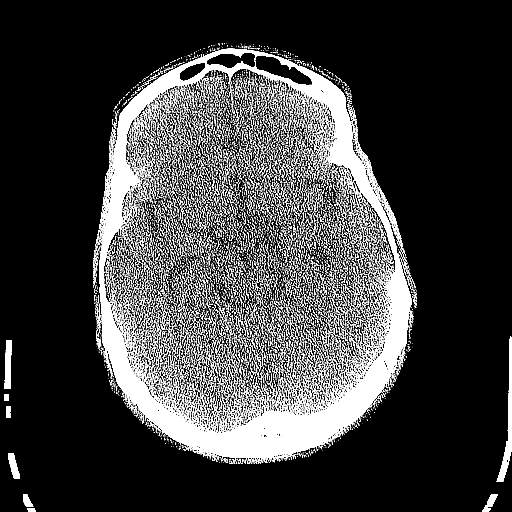
[im 38/85  brain]
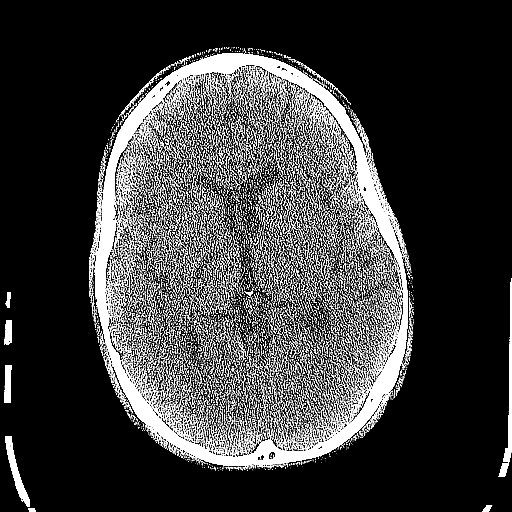
[im 38/85  bone]
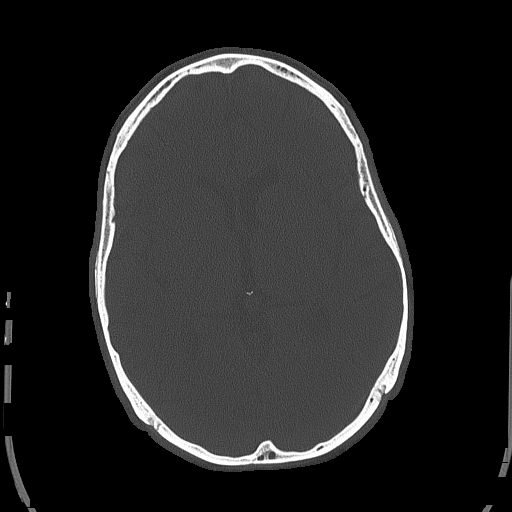
[im 47/85  brain]
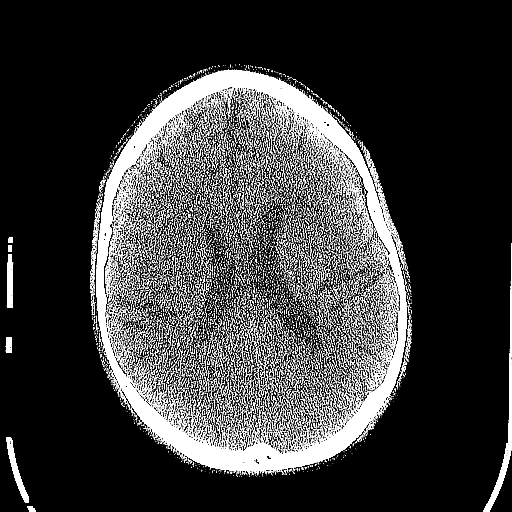
[im 55/85  brain]
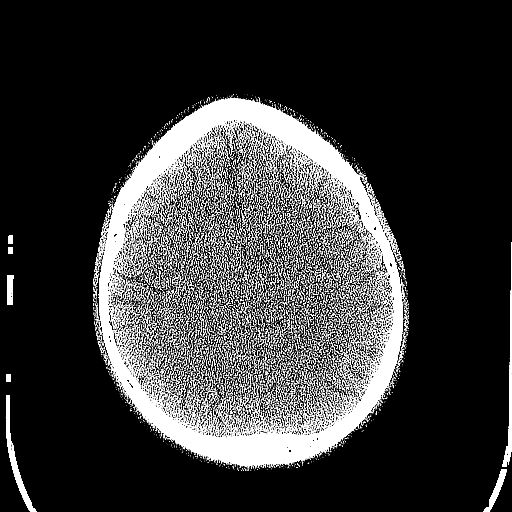
[im 64/85  brain]
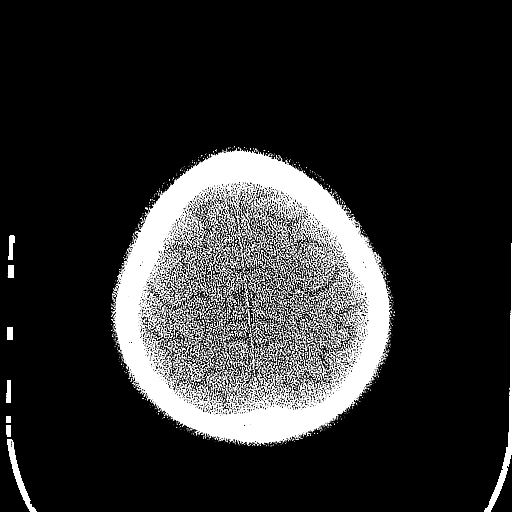
[im 72/85  brain]
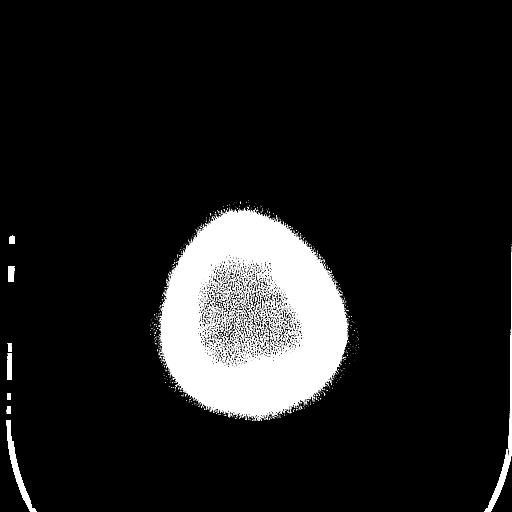
[im 72/85  bone]
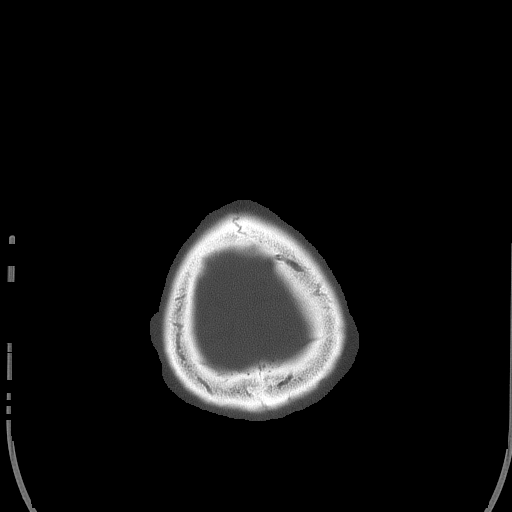
[im 80/85  brain]
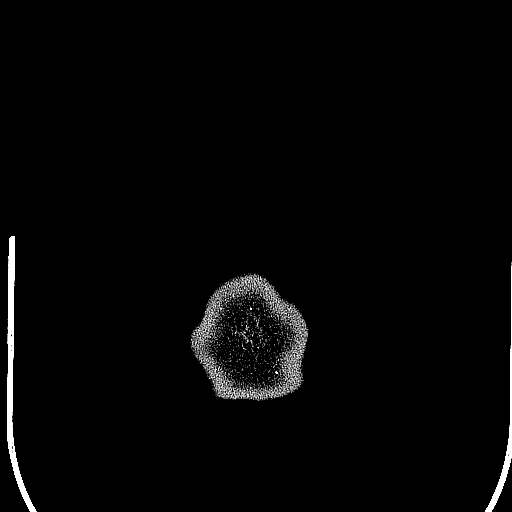

[Series 4: head 3.0 mpr cor · coronal · 0.32mm/px · 3 of 65 slices shown]
[im 22/65  brain]
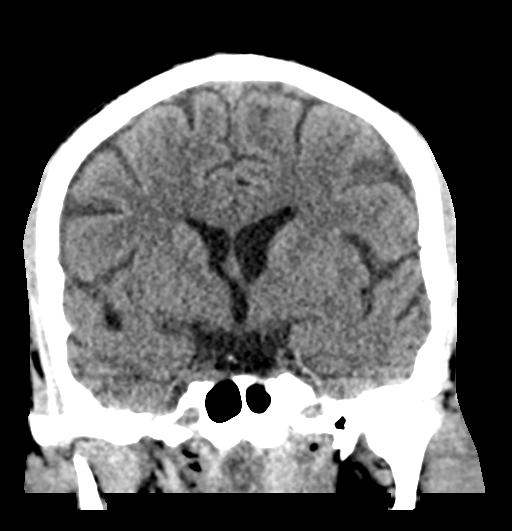
[im 29/65  brain]
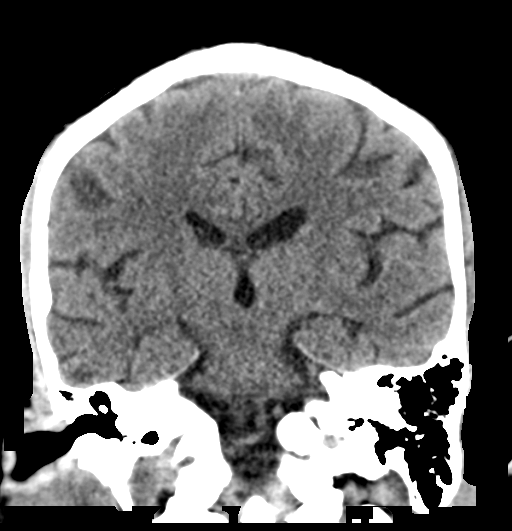
[im 36/65  brain]
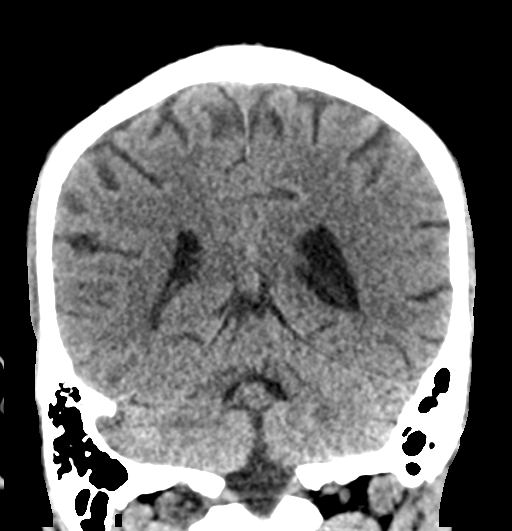

[Series 5: head 3.0 mpr · sagittal · 0.33mm/px · 3 of 66 slices shown]
[im 22/66  brain]
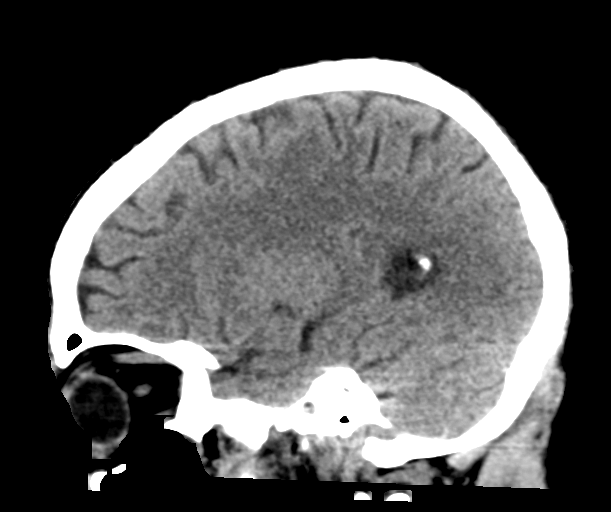
[im 33/66  brain]
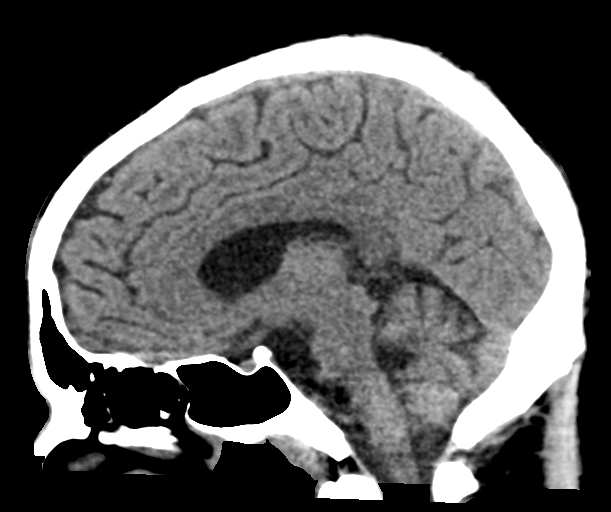
[im 44/66  brain]
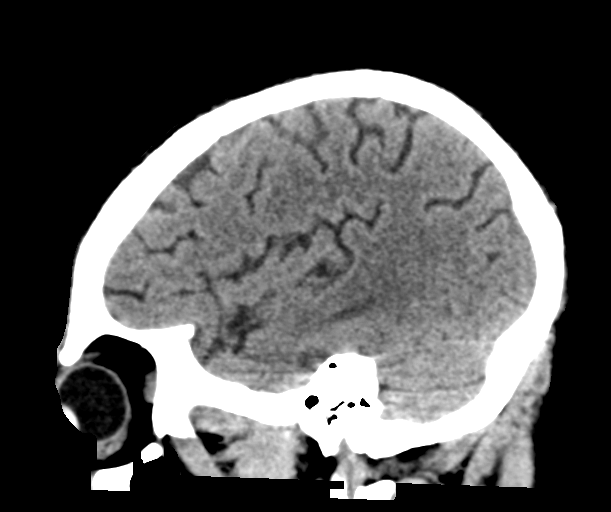

[16 of 47 positions shown; findings below may reference images not displayed]

FINDINGS: Bony calvarium is intact. No significant ventricular dilatation is
seen. No findings to suggest acute hemorrhage, acute infarction or
space-occupying mass lesion are seen.
IMPRESSION: No acute intracranial abnormality noted.

## 2017-06-08 IMAGING — RF DG FLUORO GUIDE SPINAL/SI JT INJ*R*
1 series · 1 of 1 positions shown · non-contrast
Comparison: none

CLINICAL DATA: AIDS (acquired immune deficiency syndrome) (HCC) B20
(3SE-9Z-CM)

[Series 1: cp_standard · 0.18mm/px · 1 of 1 slices shown]
[im 1/1]
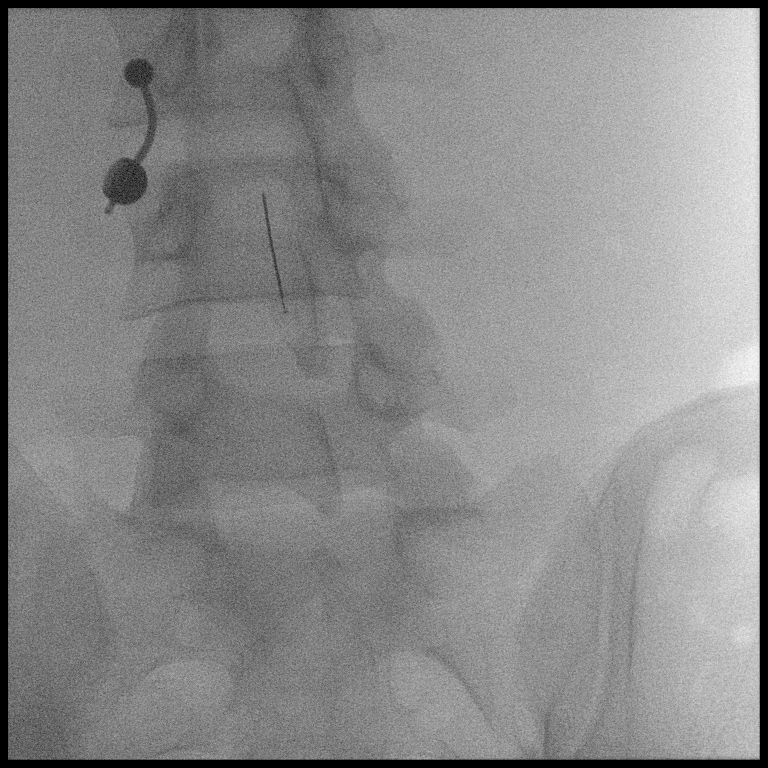

[1 of 1 positions shown; findings below may reference images not displayed]

EXAM:
DIAGNOSTIC LUMBAR PUNCTURE UNDER FLUOROSCOPIC GUIDANCE

FLUOROSCOPY TIME:  Fluoroscopy Time (in minutes and seconds): 0
minutes and 6 seconds

Number of Acquired Images:  1

PROCEDURE:
Informed consent was obtained from the patient prior to the
procedure, including potential complications of headache, allergy,
and pain. With the patient prone, the lower back was prepped with
Betadine. 1% Lidocaine was used for local anesthesia. Lumbar
puncture was performed at the L3-L4 level using a 22 gauge needle
with return of clear CSF with an opening pressure of 7 cm water. 10
ml of CSF were obtained for laboratory studies. The patient
tolerated the procedure well and there were no apparent
complications.
IMPRESSION: Successful fluoroscopically guided lumbar puncture with acquisition
of 10 mL of clear CSF.

## 2017-06-12 IMAGING — DX DG CHEST 2V
2 series · 2 of 2 positions shown · non-contrast
Comparison: Chest radiograph 01/03/2016

CLINICAL DATA: Patient with cough and wheezing.  Renal failure.

EXAM:
CHEST  2 VIEW

[chest pa]
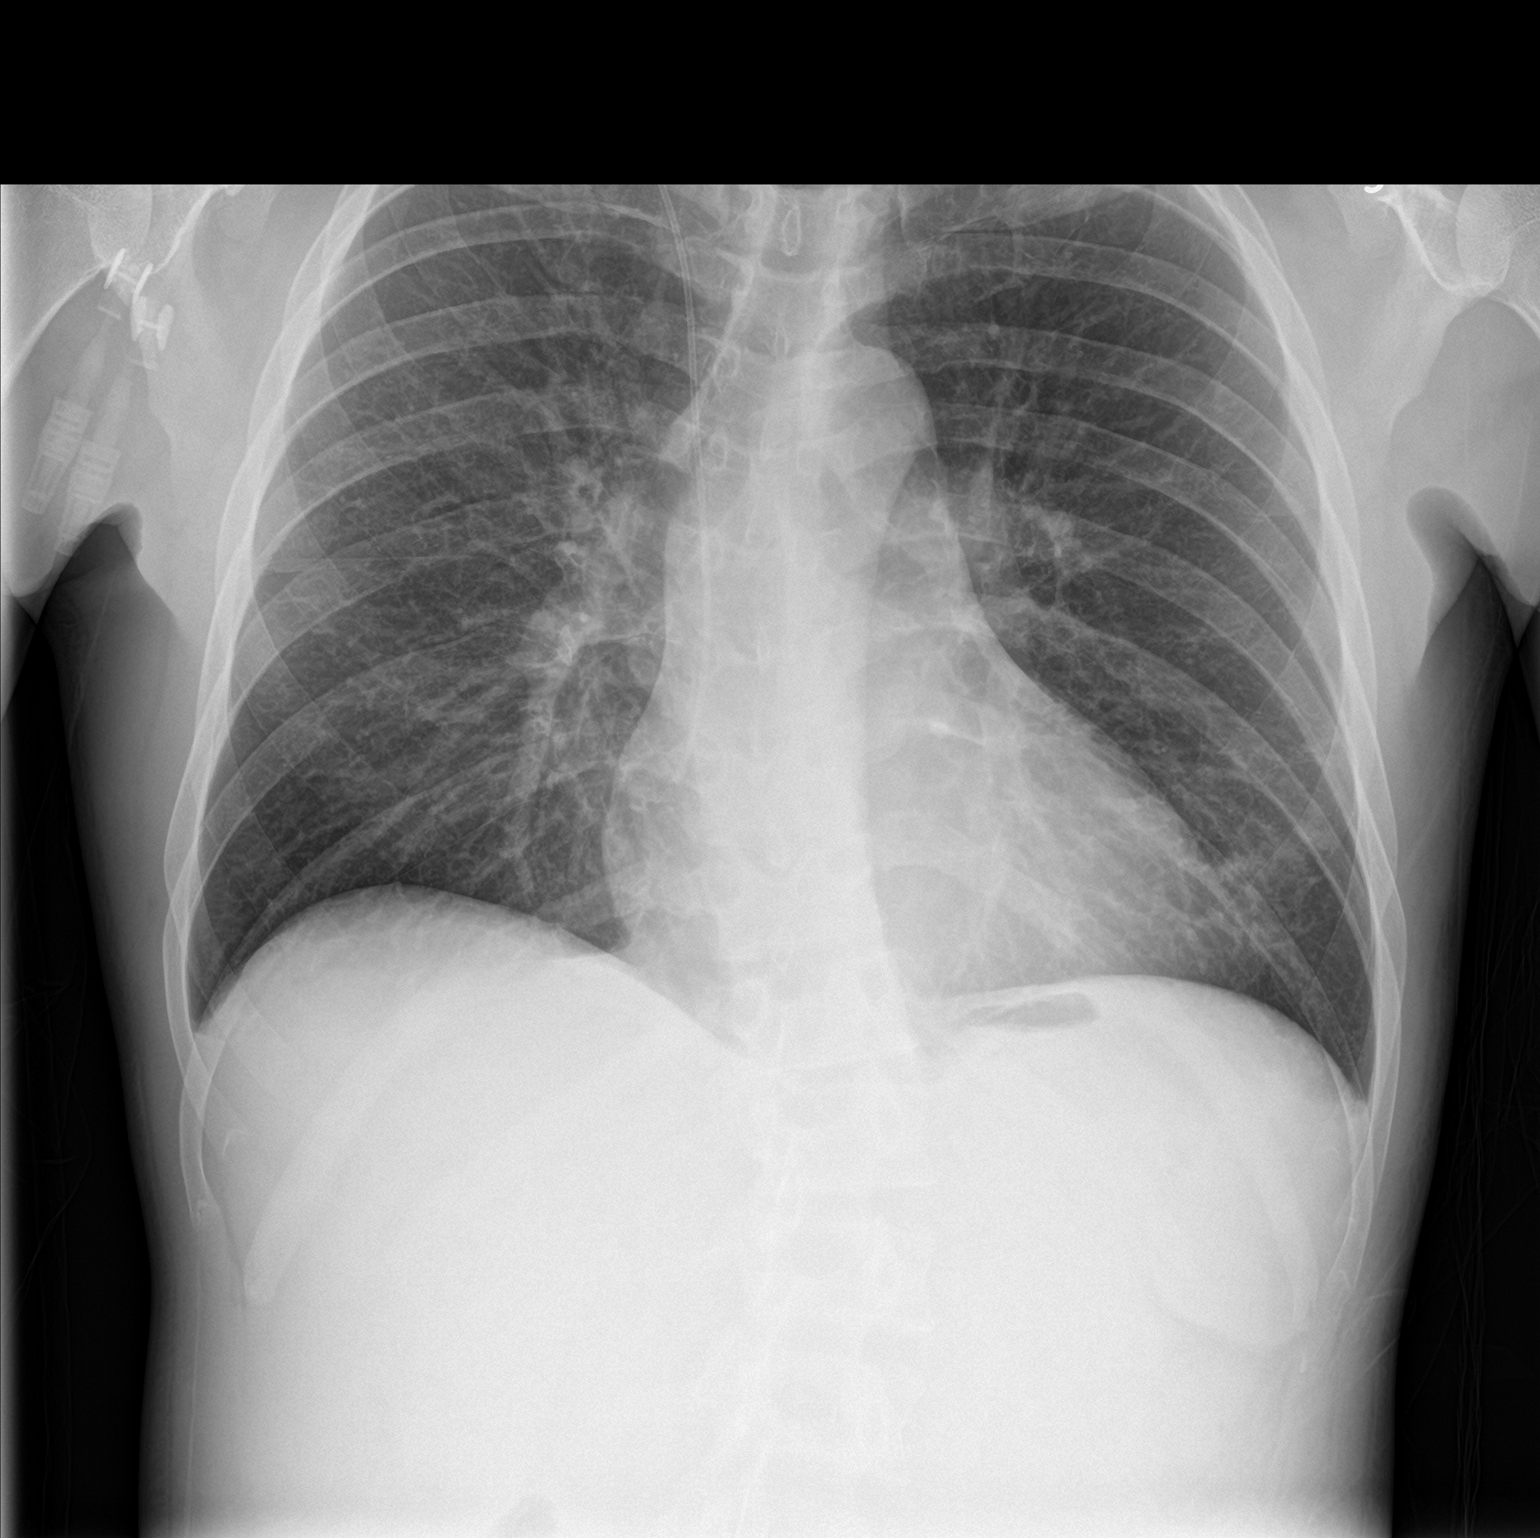

[chest lat]
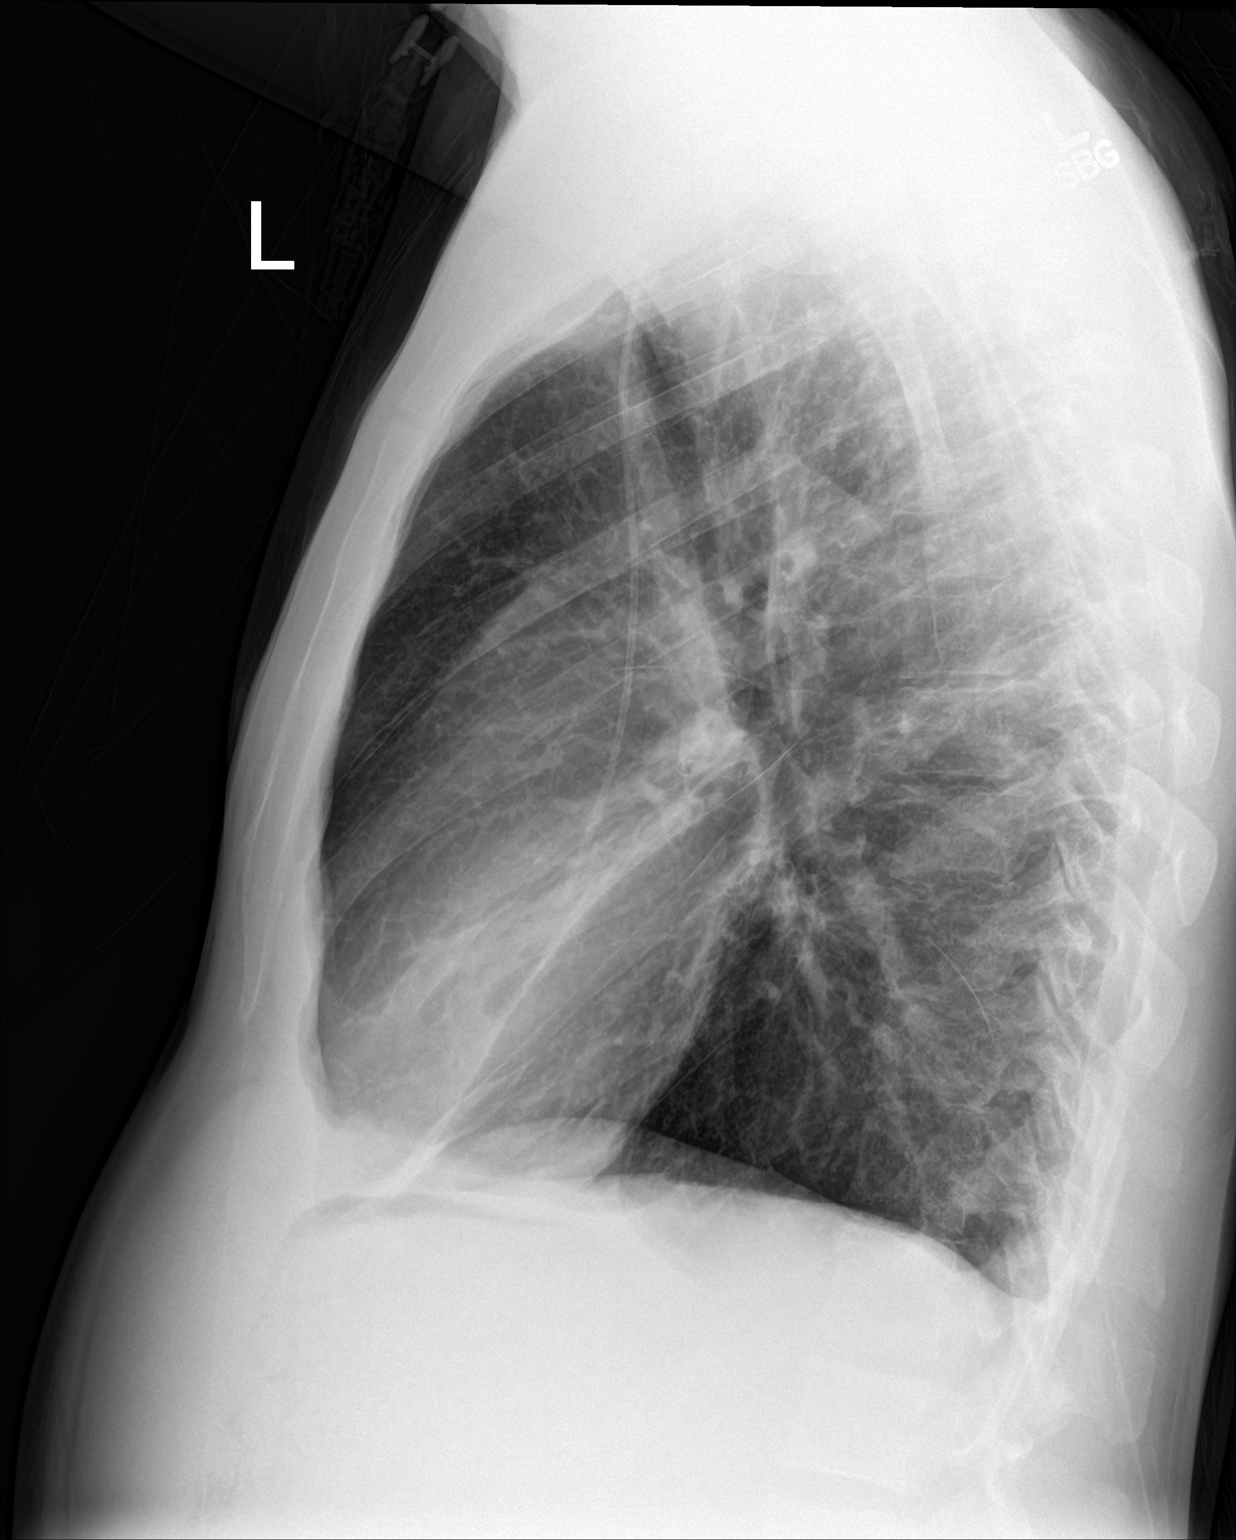

[2 of 2 positions shown; findings below may reference images not displayed]

FINDINGS: Venous catheter tip projects over the superior cavoatrial junction.
Stable cardiac and mediastinal contours. Minimal heterogeneous
opacities left lung base. No pleural effusion or pneumothorax. Mid
thoracic spine degenerative changes.
IMPRESSION: Minimal heterogeneous opacities left lung base may represent
atelectasis or infection.

## 2017-07-12 ENCOUNTER — Ambulatory Visit (INDEPENDENT_AMBULATORY_CARE_PROVIDER_SITE_OTHER): Payer: Self-pay | Admitting: Pharmacist

## 2017-07-12 DIAGNOSIS — B20 Human immunodeficiency virus [HIV] disease: Secondary | ICD-10-CM

## 2017-07-12 MED ORDER — BICTEGRAVIR-EMTRICITAB-TENOFOV 50-200-25 MG PO TABS: 1.0000 | ORAL_TABLET | Freq: Every day | ORAL | 5 refills | Status: DC

## 2017-07-12 NOTE — Progress Notes (Signed)
HPI: Jonathon Moore is a 32 y.o. male who presents to [facility] to follow-up for management of HIV infection.   Allergies: No Known Allergies   Past Medical History: Past Medical History:  Diagnosis Date  . Atypical chest pain 02/24/2016  . Bronchitis   . CKD (chronic kidney disease) stage 5, GFR less than 15 ml/min (HCC) 02/11/2016  . Hemorrhoids   . HIV (human immunodeficiency virus infection) ([place])   . Lower back pain 02/24/2016  . Poor compliance 02/09/2017    Social History: Social History   Socioeconomic History  . Marital status: Single    Spouse name: Not on file  . Number of children: Not on file  . Years of education: Not on file  . Highest education level: Not on file  Social Needs  . Financial resource strain: Not on file  . Food insecurity - worry: Not on file  . Food insecurity - inability: Not on file  . Transportation needs - medical: Not on file  . Transportation needs - non-medical: Not on file  Occupational History  . Not on file  Tobacco Use  . Smoking status: Former Smoker    Packs/day: 0.20  . Smokeless tobacco: Never Used  Substance and Sexual Activity  . Alcohol use: Yes    Comment: rarely  . Drug use: No  . Sexual activity: No    Partners: Male    Birth control/protection: Condom    Comment: given condoms  Other Topics Concern  . Not on file  Social History Narrative  . Not on file    Previous Regimen: Biktarvy  Current Regimen: Prescobix + Tivicay + Descovy  Labs: HIV 1 RNA Quant (copies/mL)  Date Value  02/09/2017 <20 NOT DETECTED  08/25/2016 202 (H)  07/27/2016 1,010 (H)   CD4 T Cell Abs (/uL)  Date Value  12/28/2016 230 (L)  08/25/2016 280 (L)  07/27/2016 200 (L)   Hepatitis B Surface Ag (no units)  Date Value  01/03/2016 Negative    CrCl: CrCl cannot be calculated (Patient's most recent lab result is older than the maximum 21 days allowed.).  Lipids:    Component Value Date/Time   CHOL 198 12/28/2016 1538    TRIG 140 12/28/2016 1538   HDL 40 (L) 12/28/2016 1538   CHOLHDL 5.0 (H) 12/28/2016 1538   VLDL 28 12/28/2016 1538   LDLCALC 130 (H) 12/28/2016 1538    Assessment: Mabry has history of non-compliance to his HIV regimen. 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  He was last seen 01/2017 when he ran out of medication due to expiration of Medicaid. At this time, his VL was undetectable. Patient was approved for ADAP and has been taking Prescobix + Tivicay + Descovy until approximately two weeks ago when he stopped taking his medication because he thought they were causing pressure sores on his anus. Pt describes approximately 2 sores that are non-purulent and mildly painful. Patient stands for work [employer]), so we explained to patient these are not pressure sores and could possibly be an STD. Pt denies any recent sexual activity. Pt will f/u with NP tomorrow (2/13) of further evaluation of sores. Pt was educated to continue taking this medications. We will switch to Prescobix + Biktarvy to simplify patient's regimen from 3 to 2 pills once daily.  Pt met with Juliann Pulse to discuss renewal of ADAP since it expires March 31. He will try to bring paystubs to tomorrow's visit.  Recommendations: -Discontinue Tivicay and [place] -Continue  Prescobix -Will obtain labs (BMET, VL, CD4) at next follow-up (approx. 1 month) -Appt. with Janene Madeira, NP 2/13 at [facility] [place] 07/12/2017, 11:44 AM

## 2017-07-12 NOTE — Patient Instructions (Signed)
Please go pick up [place] at [facility] on [address].   Stop Tivicay and Descovy.    Continue taking Prezcobix.  Start taking Biktarvy.

## 2017-07-13 ENCOUNTER — Encounter: Payer: Self-pay | Admitting: Infectious Diseases

## 2017-07-13 ENCOUNTER — Ambulatory Visit (INDEPENDENT_AMBULATORY_CARE_PROVIDER_SITE_OTHER): Payer: Self-pay | Admitting: Infectious Diseases

## 2017-07-13 VITALS — BP 129/90 | HR 67 | Temp 98.6°F | Ht 72.0 in | Wt 192.0 lb

## 2017-07-13 DIAGNOSIS — B20 Human immunodeficiency virus [HIV] disease: Secondary | ICD-10-CM

## 2017-07-13 DIAGNOSIS — B009 Herpesviral infection, unspecified: Secondary | ICD-10-CM

## 2017-07-13 DIAGNOSIS — R197 Diarrhea, unspecified: Secondary | ICD-10-CM

## 2017-07-13 MED ORDER — VALACYCLOVIR HCL 1 G PO TABS: 1000.0000 mg | ORAL_TABLET | Freq: Two times a day (BID) | ORAL | 0 refills | Status: AC

## 2017-07-13 NOTE — Progress Notes (Signed)
Name: Jonathon Moore  DOB: 02-29-1986 MRN: [id number] PCP: Patient, No Pcp Per   Chief Complaint  Patient presents with  . HIV Positive/AIDS    follow up   . Skin Ulcer    rectal ulcer     Patient Active Problem List   Diagnosis Date Noted  . Herpes infection 07/13/2017  . Poor compliance 02/09/2017  . Atypical chest pain 02/24/2016  . Lower back pain 02/24/2016  . CKD (chronic kidney disease) stage 5, GFR less than 15 ml/min (HCC) 02/11/2016  . Wheezing   . Cough   . Headache   . Neurosyphilis   . AKI (acute kidney injury) ([place])   . AIDS (acquired immune deficiency syndrome) ([place])   . 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 Depression   . Normocytic anemia 01/04/2016  . Unintentional weight loss 01/04/2016  . Poor dentition 01/04/2016  . Acute renal failure ([place]) 01/03/2016  . Metabolic acidosis 32/20/2542  . Diarrhea 01/03/2016  . DOE (dyspnea on exertion)      Brief Narrative: Sim is a 32 y.o. AA male with HIV infection.   Genotype 12/2016 - K70K/E low level resistance to TDF   Subjective:  Jonathon Moore is here today for follow up for his HIV care. He has been off and on medications since he was diagnosed with HIV. He [place] not certain as to how long he has known of his status and not certain as to what his counts are or have been. He saw Cassie in our pharmacy clinic yesterday for follow up after restarting him on Prezcobix, Tivicay and Descovy. At this visit he stopped taking his ART because he was concerned they were causing pressure ulcers on his backside. He was switched to [facility] and Prezcobix to simplify his regimen and is here to see me today   He is an employee of [employer] and is on his feet most of the day. Last sexual partner was in January. More pain at first when they were present 1.5 weeks ago but this has since improved for the most part. No history of herpes in the past from what he knows.   HIV 1 RNA Quant (copies/mL)  Date Value    02/09/2017 <20 NOT DETECTED  08/25/2016 202 (H)  07/27/2016 1,010 (H)   CD4 T Cell Abs (/uL)  Date Value  12/28/2016 230 (L)  08/25/2016 280 (L)  07/27/2016 200 (L)   Review of Systems  Constitutional: Negative for chills and fever.  Eyes: Negative for blurred vision and photophobia.  Respiratory: Negative for cough and sputum production.   Cardiovascular: Negative for chest pain.  Gastrointestinal: Positive for diarrhea. Negative for nausea and vomiting.  Genitourinary: Negative for dysuria.  Skin: Positive for rash.  Neurological: Negative for headaches.    Past Medical History:  Diagnosis Date  . Atypical chest pain 02/24/2016  . Bronchitis   . CKD (chronic kidney disease) stage 5, GFR less than 15 ml/min (HCC) 02/11/2016  . Hemorrhoids   . HIV (human immunodeficiency virus infection) ([place])   . Lower back pain 02/24/2016  . Poor compliance 02/09/2017    Outpatient Medications Prior to Visit  Medication Sig Dispense Refill  . bictegravir-emtricitabine-tenofovir AF (BIKTARVY) 50-200-25 MG TABS tablet Take 1 tablet by mouth daily. 30 tablet 5  . darunavir-cobicistat (PREZCOBIX) 800-150 MG tablet Take 1 tablet daily with breakfast by mouth. 30 tablet 5  . Acetaminophen (TYLENOL EXTRA STRENGTH PO) Take by mouth.    Marland Kitchen amLODipine (NORVASC) 5 MG  tablet Take 1 tablet (5 mg total) by mouth at bedtime. (Patient not taking: Reported on 07/13/2017) 30 tablet 5  . furosemide (LASIX) 80 MG tablet Take 1 tablet (80 mg total) by mouth daily as needed. (Patient not taking: Reported on 07/13/2017) 30 tablet 4  . ondansetron (ZOFRAN-ODT) 8 MG disintegrating tablet Take 1 tablet (8 mg total) by mouth every 8 (eight) hours as needed for nausea. (Patient not taking: Reported on 07/13/2017) 12 tablet 0  . triamcinolone cream (KENALOG) 0.1 % Apply 1 application topically 2 (two) times daily. (Patient not taking: Reported on 07/13/2017) 453 g 1   No facility-administered medications prior to visit.       No Known Allergies  Social History   Tobacco Use  . Smoking status: Former Smoker    Packs/day: 0.20  . Smokeless tobacco: Never Used  Substance Use Topics  . Alcohol use: Yes    Comment: rarely  . Drug use: No    No family history on file.  Social History   Substance and Sexual Activity  Sexual Activity No  . Partners: Male  . Birth control/protection: Condom   Comment: given condoms     Objective:   Vitals:   07/13/17 1455  BP: 129/90  Pulse: 67  Temp: 98.6 F (37 C)  TempSrc: Oral  Weight: 192 lb (87.1 kg)  Height: 6' (1.829 m)   Body mass index is 26.04 kg/m.  Physical Exam  Constitutional: He is oriented to person, place, and time and well-developed, well-nourished, and in no distress.  HENT:  Mouth/Throat: No oral lesions. Normal dentition. No dental caries.  Eyes: No scleral icterus.  Cardiovascular: Normal rate, regular rhythm and normal heart sounds.  Pulmonary/Chest: Effort normal and breath sounds normal.  Abdominal: Soft. He exhibits no distension. There is no tenderness.  Diarrhea visible in between buttocks during exam.   Genitourinary:  Genitourinary Comments: Multiple small (< 0.5cm) ulcerated and crusted lesions noted to intergluteal cleft. No purulence or drainage.   Lymphadenopathy:    He has no cervical adenopathy.  Neurological: He is alert and oriented to person, place, and time.  Skin: Skin is warm and dry. No rash noted.  Psychiatric: Mood and affect normal.  Vitals reviewed.  Lab Results Lab Results  Component Value Date   WBC 4.1 12/28/2016   HGB 15.6 12/28/2016   HCT 46.5 12/28/2016   MCV 95.7 12/28/2016   PLT 290 12/28/2016    Lab Results  Component Value Date   CREATININE 1.49 (H) 12/28/2016   BUN 13 12/28/2016   NA 139 12/28/2016   K 4.1 12/28/2016   CL 107 12/28/2016   CO2 21 12/28/2016    Lab Results  Component Value Date   ALT 24 12/28/2016   AST 21 12/28/2016   ALKPHOS 87 12/28/2016   BILITOT 1.3  (H) 12/28/2016    Lab Results  Component Value Date   CHOL 198 12/28/2016   HDL 40 (L) 12/28/2016   LDLCALC 130 (H) 12/28/2016   TRIG 140 12/28/2016   CHOLHDL 5.0 (H) 12/28/2016   HIV 1 RNA Quant (copies/mL)  Date Value  02/09/2017 <20 NOT DETECTED  08/25/2016 202 (H)  07/27/2016 1,010 (H)   CD4 T Cell Abs (/uL)  Date Value  12/28/2016 230 (L)  08/25/2016 280 (L)  07/27/2016 200 (L)   Lab Results  Component Value Date   HAV Positive (A) 01/04/2016   Lab Results  Component Value Date   HEPBSAG Negative 01/03/2016   No  results found for: HCVAB Lab Results  Component Value Date   CHLAMYDIAWP Negative 08/25/2016   CHLAMYDIAWP Negative 08/25/2016   N Negative 08/25/2016   N Negative 08/25/2016   No results found for: GCPROBEAPT No results found for: QUANTGOLD No results found for: RPR   Assessment & Plan:   Problem List Items Addressed This Visit      Other   Herpes infection    I will check HSV culture to verify however these lesions have been present almost 2 weeks now and likely it will be negative. Clinically consistent with herpes simplex. I will provide him with Valtrex 1gm BID x 10 days. Education provided to prevent spread of this and likelihood of possible recurrence in the future. No way to determine when he was exposed.       Relevant Medications   valACYclovir (VALTREX) 1000 MG tablet   Other Relevant Orders   Herpes simplex virus culture   Diarrhea    No other symptoms suggestive of infectious process. Likely r/t resuming his ART. I have advised for him to try over the counter imodium for these symptoms while he adjusts to medications. Asked him to keep his buttock ulcers as clean as possible to avoid bacterial superinfection.       AIDS (acquired immune deficiency syndrome) ([employer]) - Primary    Advised his ulcers are not related to HIV medications. I discussed how it is important to get his immune system in best possible shape to prevent severe and  recurrent outbreaks of herpes by taking his HIV medications every day. He understands he has resistance mutations and is on more pills than most people already. He will follow up with Cassie again in 4 weeks. He told me he has picked up his biktarvy.       Relevant Medications   valACYclovir (VALTREX) 1000 MG tablet     Janene Madeira, MSN, NP-C [facility] for Infect[facility] [place] Pager: [phone] Office: [phone]  07/13/17  3:57 PM

## 2017-07-13 NOTE — Assessment & Plan Note (Signed)
No other symptoms suggestive of infectious process. Likely r/t resuming his ART. I have advised for him to try over the counter imodium for these symptoms while he adjusts to medications. Asked him to keep his buttock ulcers as clean as possible to avoid bacterial superinfection.

## 2017-07-13 NOTE — Patient Instructions (Addendum)
Continue taking your Biktarvy and Prezcobix   I think your ulcers are due to herpes - this is a virus that is very common in people. I want to start you on a medication called valtrex. Please take one pill twice a day for 10 days. This is not a cure but will help your ulcers go away faster.   You can take over the counter imodium for diarrhea as the box tells you - generic brand at [facility] or [facility] [employer] works perfect well.   Please schedule an appointment see Cassie with pharmacy again in 1 month    Genital Herpes Genital herpes is a common sexually transmitted infection (STI) that is caused by a virus. The virus spreads from person to person through sexual contact. Infection can cause itching, blisters, and sores around the genitals or rectum. Symptoms may last several days and then go away This is called an outbreak. However, the virus remains in your body, so you may have more outbreaks in the future. The time between outbreaks varies and can be months or years. Genital herpes affects men and women. It is particularly concerning for pregnant women because the virus can be passed to the baby during delivery and can cause serious problems. Genital herpes is also a concern for people who have a weak disease-fighting (immune) system. What are the causes? This condition is caused by the herpes simplex virus (HSV) type 1 or type 2. The virus may spread through:  Sexual contact with an infected person, including vaginal, anal, and oral sex.  Contact with fluid from a herpes sore.  The skin. This means that you can get herpes from an infected partner even if he or she does not have a visible sore or does not know that he or she is infected.  What increases the risk? You are more likely to develop this condition if:  You have sex with many partners.  You do not use latex condoms during sex.  What are the signs or symptoms? Most people do not have symptoms (asymptomatic) or have mild symptoms  that may be mistaken for other skin problems. Symptoms may include:  Small red bumps near the genitals, rectum, or mouth. These bumps turn into blisters and then turn into sores.  Flu-like symptoms, including: ? Fever. ? Body aches. ? Swollen lymph nodes. ? Headache.  Painful urination.  Pain and itching in the genital area or rectal area.  Vaginal discharge.  Tingling or shooting pain in the legs and buttocks.  Generally, symptoms are more severe and last longer during the first (primary) outbreak. Flu-like symptoms are also more common during the primary outbreak. How is this diagnosed? Genital herpes may be diagnosed based on:  A physical exam.  Your medical history.  Blood tests.  A test of a fluid sample (culture) from an open sore.  How is this treated? There is no cure for this condition, but treatment with antiviral medicines that are taken by mouth (orally) can do the following:  Speed up healing and relieve symptoms.  Help to reduce the spread of the virus to sexual partners.  Limit the chance of future outbreaks, or make future outbreaks shorter.  Lessen symptoms of future outbreaks.  Your health care provider may also recommend pain relief medicines, such as aspirin or ibuprofen. Follow these instructions at home: Sexual activity  Do not have sexual contact during active outbreaks.  Practice safe sex. Latex condoms and male condoms may help prevent the spread of the  herpes virus. General instructions  Keep the affected areas dry and clean.  Take over-the-counter and prescription medicines only as told by your health care provider.  Avoid rubbing or touching blisters and sores. If you do touch blisters or sores: ? Wash your hands thoroughly with soap and water. ? Do not touch your eyes afterward.  To help relieve pain or itching, you may take the following actions as directed by your health care provider: ? Apply a cold, wet cloth (cold  compress) to affected areas 4-6 times a day. ? Apply a substance that protects your skin and reduces bleeding (astringent). ? Apply a gel that helps relieve pain around sores (lidocaine gel). ? Take a warm, shallow bath that cleans the genital area (sitz bath).  Keep all follow-up visits as told by your health care provider. This is important. How is this prevented?  Use condoms. Although anyone can get genital herpes during sexual contact, even with the use of a condom, a condom can provide some protection.  Avoid having multiple sexual partners.  Talk with your sexual partner about any symptoms either of you may have. Also, talk with your partner about any history of STIs.  Get tested for STIs before you have sex. Ask your partner to do the same.  Do not have sexual contact if you have symptoms of genital herpes. Contact a health care provider if:  Your symptoms are not improving with medicine.  Your symptoms return.  You have new symptoms.  You have a fever.  You have abdominal pain.  You have redness, swelling, or pain in your eye.  You notice new sores on other parts of your body.  You are a woman and experience bleeding between menstrual periods.  You have had herpes and you become pregnant or plan to become pregnant. Summary  Genital herpes is a common sexually transmitted infection (STI) that is caused by the herpes simplex virus (HSV) type 1 or type 2.  These viruses are most often spread through sexual contact with an infected person.  You are more likely to develop this condition if you have sex with many partners or you have unprotected sex.  Most people do not have symptoms (asymptomatic) or have mild symptoms that may be mistaken for other skin problems. Symptoms occur as outbreaks that may happen months or years apart.  There is no cure for this condition, but treatment with oral antiviral medicines can reduce symptoms, reduce the chance of spreading the  virus to a partner, prevent future outbreaks, or shorten future outbreaks. This information is not intended to replace advice given to you by your health care provider. Make sure you discuss any questions you have with your health care provider. Document Released: 05/14/2000 Document Revised: 04/16/2016 Document Reviewed: 04/16/2016 Elsevier Interactive Patient Education  [employer].

## 2017-07-13 NOTE — Assessment & Plan Note (Signed)
I will check HSV culture to verify however these lesions have been present almost 2 weeks now and likely it will be negative. Clinically consistent with herpes simplex. I will provide him with Valtrex 1gm BID x 10 days. Education provided to prevent spread of this and likelihood of possible recurrence in the future. No way to determine when he was exposed.

## 2017-07-13 NOTE — Assessment & Plan Note (Signed)
Advised his ulcers are not related to HIV medications. I discussed how it is important to get his immune system in best possible shape to prevent severe and recurrent outbreaks of herpes by taking his HIV medications every day. He understands he has resistance mutations and is on more pills than most people already. He will follow up with Cassie again in 4 weeks. He told me he has picked up his biktarvy.

## 2017-07-15 LAB — HERPES SIMPLEX VIRUS CULTURE
MICRO NUMBER:: 90193370
SPECIMEN QUALITY: ADEQUATE

## 2017-07-15 NOTE — Progress Notes (Signed)
Lesions were present for over 1.5 weeks and are most c/w hsv. I will continue to treat despite negative culture which may be falsely negative due to age of the lesions.

## 2017-08-10 ENCOUNTER — Ambulatory Visit: Payer: Self-pay

## 2017-08-17 ENCOUNTER — Ambulatory Visit (INDEPENDENT_AMBULATORY_CARE_PROVIDER_SITE_OTHER): Payer: Self-pay | Admitting: Pharmacist

## 2017-08-17 DIAGNOSIS — B009 Herpesviral infection, unspecified: Secondary | ICD-10-CM

## 2017-08-17 DIAGNOSIS — B20 Human immunodeficiency virus [HIV] disease: Secondary | ICD-10-CM

## 2017-08-17 MED ORDER — VALACYCLOVIR HCL 1 G PO TABS: 1000.0000 mg | ORAL_TABLET | Freq: Two times a day (BID) | ORAL | 0 refills | Status: DC

## 2017-08-17 MED ORDER — TRIAMCINOLONE ACETONIDE 0.1 % EX CREA: 1.0000 "application " | TOPICAL_CREAM | Freq: Two times a day (BID) | CUTANEOUS | 4 refills | Status: DC

## 2017-08-17 NOTE — Progress Notes (Signed)
[facility]acy Visit  HPI: Jonathon Moore is a 32 y.o. male who presents to the [facility] pharmacy clinic for HIV follow-up.  Patient Active Problem List   Diagnosis Date Noted  . Herpes infection 07/13/2017  . Poor compliance 02/09/2017  . Atypical chest pain 02/24/2016  . Lower back pain 02/24/2016  . CKD (chronic kidney disease) stage 5, GFR less than 15 ml/min (HCC) 02/11/2016  . Wheezing   . Cough   . Headache   . Neurosyphilis   . AKI (acute kidney injury) (HCC)   . AIDS (acquired immune deficiency syndrome) (HCC)   . HIVAN (HIV-associated nephropathy) (HCC)   . Depression   . Normocytic anemia 01/04/2016  . Unintentional weight loss 01/04/2016  . Poor dentition 01/04/2016  . Acute renal failure (HCC) 01/03/2016  . Metabolic acidosis 01/03/2016  . Diarrhea 01/03/2016  . DOE (dyspnea on exertion)     Patient's Medications  New Prescriptions   VALACYCLOVIR (VALTREX) 1000 MG TABLET    Take 1 tablet (1,000 mg total) by mouth 2 (two) times daily.  Previous Medications   ACETAMINOPHEN (TYLENOL EXTRA STRENGTH PO)    Take by mouth.   AMLODIPINE (NORVASC) 5 MG TABLET    Take 1 tablet (5 mg total) by mouth at bedtime.   BICTEGRAVIR-EMTRICITABINE-TENOFOVIR AF (BIKTARVY) 50-200-25 MG TABS TABLET    Take 1 tablet by mouth daily.   DARUNAVIR-COBICISTAT (PREZCOBIX) 800-150 MG TABLET    Take 1 tablet daily with breakfast by mouth.   FUROSEMIDE (LASIX) 80 MG TABLET    Take 1 tablet (80 mg total) by mouth daily as needed.   ONDANSETRON (ZOFRAN-ODT) 8 MG DISINTEGRATING TABLET    Take 1 tablet (8 mg total) by mouth every 8 (eight) hours as needed for nausea.  Modified Medications   Modified Medication Previous Medication   TRIAMCINOLONE CREAM (KENALOG) 0.1 % triamcinolone cream (KENALOG) 0.1 %      Apply 1 application topically 2 (two) times daily.    Apply 1 application topically 2 (two) times daily.  Discontinued Medications   No medications on  file    Allergies: No Known Allergies  Past Medical History: Past Medical History:  Diagnosis Date  . Atypical chest pain 02/24/2016  . Bronchitis   . CKD (chronic kidney disease) stage 5, GFR less than 15 ml/min (HCC) 02/11/2016  . Hemorrhoids   . HIV (human immunodeficiency virus infection) (HCC)   . Lower back pain 02/24/2016  . Poor compliance 02/09/2017    Social History: Social History   Socioeconomic History  . Marital status: Single    Spouse name: Not on file  . Number of children: Not on file  . Years of education: Not on file  . Highest education level: Not on file  Social Needs  . Financial resource strain: Not on file  . Food insecurity - worry: Not on file  . Food insecurity - inability: Not on file  . Transportation needs - medical: Not on file  . Transportation needs - non-medical: Not on file  Occupational History  . Not on file  Tobacco Use  . Smoking status: Former Smoker    Packs/day: 0.20  . Smokeless tobacco: Never Used  Substance and Sexual Activity  . Alcohol use: Yes    Comment: rarely  . Drug use: No  . Sexual activity: No    Partners: Male    Birth control/protection: Condom    Comment: given condoms  Other Topics Concern  . Not  on file  Social History Narrative  . Not on file    Labs: HIV 1 RNA Quant (copies/mL)  Date Value  02/09/2017 <20 NOT DETECTED  08/25/2016 202 (H)  07/27/2016 1,010 (H)   CD4 T Cell Abs (/uL)  Date Value  12/28/2016 230 (L)  08/25/2016 280 (L)  07/27/2016 200 (L)   Hepatitis B Surface Ag (no units)  Date Value  01/03/2016 Negative    Lipids:    Component Value Date/Time   CHOL 198 12/28/2016 1538   TRIG 140 12/28/2016 1538   HDL 40 (L) 12/28/2016 1538   CHOLHDL 5.0 (H) 12/28/2016 1538   VLDL 28 12/28/2016 1538   LDLCALC 130 (H) 12/28/2016 1538    Current HIV Regimen: Biktarvy + Prezcobix  Assessment: Jonathon Moore is here today for follow-up of his HIV infection.  I recently saw him ~1  month ago and changed him from Tivicay + Prezcobix + [place]covy to [place] + Prezcobix for easier pill burden.  He had stopped his 3 medications for ~2 weeks due to thinking they were causing "pressure sores".  I had him see Colletta Maryland the next day and he was diagnosed with Herpes.  Colletta Maryland prescribed 10 days of Valtrex.  He tells me today that he never picked up his Valtrex but his sores have gone away. He states he went to [facility] to pick it up and the Rx was not there.   He states he is doing well on the new regimen and has not missed any doses since I saw him last.  He did bring in his paperwork for ADAP renewal so I praised him for that. He isn't having any side effects on the Biktarvy or Prezcobix.  I will resend in the 10 day supply of Valtrex for him and I told him to take it anyway and he agreed that he should.  I also refilled his steroid cream for his eczema.  I will get labs today and have him come back and see Dr. Tommy Medal in ~3 months.  He knows to call me with any issues.  Plan: - Continue Biktarvy PO once daily - Continue Prezcobix PO once daily with food - HIV VL and CD4 today - F/u with Dr. Tommy Medal 6/12 at 1045am  Kialee Kham L. Nicholes Hibler, PharmD, [place], [place] [facility] 08/17/2017, 4:06 PM

## 2017-08-18 ENCOUNTER — Encounter: Payer: Self-pay | Admitting: Infectious Disease

## 2017-08-19 LAB — HIV-1 RNA QUANT-NO REFLEX-BLD
HIV 1 RNA QUANT: 137 {copies}/mL — AB
HIV-1 RNA Quant, Log: 2.14 Log copies/mL — ABNORMAL HIGH

## 2017-08-19 LAB — T-HELPER CELL (CD4) - (RCID CLINIC ONLY)
CD4 % Helper T Cell: 16 % — ABNORMAL LOW (ref 33–55)
CD4 T Cell Abs: 340 /uL — ABNORMAL LOW (ref 400–2700)

## 2017-10-10 ENCOUNTER — Other Ambulatory Visit: Payer: Self-pay | Admitting: Pharmacist

## 2017-10-10 DIAGNOSIS — B20 Human immunodeficiency virus [HIV] disease: Secondary | ICD-10-CM

## 2017-10-10 MED ORDER — DARUNAVIR-COBICISTAT 800-150 MG PO TABS: 1.0000 | ORAL_TABLET | Freq: Every day | ORAL | 5 refills | Status: DC

## 2017-10-10 MED ORDER — BICTEGRAVIR-EMTRICITAB-TENOFOV 50-200-25 MG PO TABS: 1.0000 | ORAL_TABLET | Freq: Every day | ORAL | 5 refills | Status: DC

## 2017-11-09 ENCOUNTER — Ambulatory Visit (INDEPENDENT_AMBULATORY_CARE_PROVIDER_SITE_OTHER): Payer: Self-pay | Admitting: Infectious Disease

## 2017-11-09 ENCOUNTER — Encounter: Payer: Self-pay | Admitting: Infectious Disease

## 2017-11-09 ENCOUNTER — Encounter: Payer: Self-pay | Admitting: Licensed Clinical Social Worker

## 2017-11-09 VITALS — BP 155/77 | HR 67 | Temp 98.2°F | Ht 72.0 in | Wt 196.0 lb

## 2017-11-09 DIAGNOSIS — A523 Neurosyphilis, unspecified: Secondary | ICD-10-CM

## 2017-11-09 DIAGNOSIS — I10 Essential (primary) hypertension: Secondary | ICD-10-CM

## 2017-11-09 DIAGNOSIS — Z23 Encounter for immunization: Secondary | ICD-10-CM

## 2017-11-09 DIAGNOSIS — N185 Chronic kidney disease, stage 5: Secondary | ICD-10-CM

## 2017-11-09 DIAGNOSIS — N08 Glomerular disorders in diseases classified elsewhere: Secondary | ICD-10-CM

## 2017-11-09 DIAGNOSIS — N179 Acute kidney failure, unspecified: Secondary | ICD-10-CM

## 2017-11-09 DIAGNOSIS — F331 Major depressive disorder, recurrent, moderate: Secondary | ICD-10-CM

## 2017-11-09 DIAGNOSIS — B20 Human immunodeficiency virus [HIV] disease: Secondary | ICD-10-CM

## 2017-11-09 HISTORY — DX: Essential (primary) hypertension: I10

## 2017-11-09 MED ORDER — BICTEGRAVIR-EMTRICITAB-TENOFOV 50-200-25 MG PO TABS: 1.0000 | ORAL_TABLET | Freq: Every day | ORAL | 5 refills | Status: DC

## 2017-11-09 MED ORDER — DARUNAVIR-COBICISTAT 800-150 MG PO TABS: 1.0000 | ORAL_TABLET | Freq: Every day | ORAL | 5 refills | Status: DC

## 2017-11-09 MED ORDER — AMLODIPINE BESYLATE 5 MG PO TABS: 5.0000 mg | ORAL_TABLET | Freq: Every day | ORAL | 11 refills | Status: AC

## 2017-11-09 MED ORDER — TRIAMCINOLONE ACETONIDE 0.1 % EX CREA: 1.0000 "application " | TOPICAL_CREAM | Freq: Two times a day (BID) | CUTANEOUS | 4 refills | Status: DC

## 2017-11-09 NOTE — Patient Instructions (Signed)
I want you to meet with Jonathon Moore today  I want you to make an appt with Jonathon Moore or Jonathon Moore to renew your HMAP and Jonathon Moore programs so you don't have ANY interruption in your meds  Restart amlodipine for your blood pressure  Make appt with ID pharmacy in 1 month to check on your blood pressure  Make appt with me in 4 months

## 2017-11-09 NOTE — Addendum Note (Signed)
Addended by: Truman Hayward on: 11/09/2017 02:13 PM   Modules accepted: Level of Service

## 2017-11-09 NOTE — BH Specialist Note (Signed)
[facility]ated [facility]al [facility] Initial Visit  MRN: [id number] Name: Jonathon Moore  Number of [place] Clinician visits:: 1/6 Session Start time: 11:37am  Session End time: 11:54am Total time: 15 minutes  Type of Service: [place] Interpretor:No. Interpretor Name and Language:     Warm Hand Off Completed.       SUBJECTIVE: Jonathon Moore is a 32 y.o. male accompanied by self Patient was referred by Dr Tommy Medal for history of depression. Patient reports the following symptoms/concerns: feeling down a lot, mood swings, and wanting to stay in his room by himself.  Severity of problem: Moderate  OBJECTIVE: Mood: Depressed and Affect: Constricted Risk of harm to self or others: No plan to harm self or others  LIFE CONTEXT: Patient lives with his best friend, who is also his main source of support. 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 Most of his worries at this time are about lack of funds due to this work situation, even though he knows that he will be going back to his regular restaurant and regular hours this weekend.   GOALS ADDRESSED: Patient will: 1. Reduce symptoms of: depression   INTERVENTIONS: Interventions utilized: Motivational Interviewing and Supportive Counseling   ASSESSMENT: Patient currently experiencing mood swings, crying spells, irritability, tendency/desire to isolate, anhedonia, lack of motivation and energy. He is not having any thoughts of death/dying or self-harm at this time, but reports he has had these thoughts in the past. 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 He states that he can sometimes be in a good mood and a  depressed episode will hit him out of nowhere and he will want to just go lie in the dark in his room alone. Counselor and patient explored what patient would like to change about how he is experiencing things. Patient indicated that he thinks a lot and would like to be able to stop troublesome thoughts. Counselor and patient explored this as a goal for therapy.   Patient may benefit from ongoing Cognitive Behavioral Therapy.  PLAN: 1. Contact therapist to schedule follow up once work schedule is out for [employer].  Lillie Fragmin, LCSW

## 2017-11-09 NOTE — Progress Notes (Addendum)
Chief complaint: no new complaints  Subjective:    Chief complaint: depression   Patient ID: Jonathon Moore, male    DOB: 11-17-1985, 32 y.o.   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  He underwent vascular surgery for dialysis.  He had received 2 weeks of intravenous penicillin for his neurosyphilis.  In the hospital I started him on a regimen of TIVICAY 50 mg daily with AZT 300 mg daily and Epivir 50 mg daily.  After discharge the hospital there was a mixup apparently in his medications and he actually came to our clinic on TIVICAY 50 mg daily and Combivir 1 tablet twice daily. He has has haven't changed to 2 tablets of Epzicom for 6 mg daily along with Epivir 50 mg and continued TIVICAY at 50 mg daily.   While following him post hospital discharge he had not only nearly suppressed his virus to undetectable levels and raised his CD4 above 300 but his serum creatinine had dropped from 9 to 2.58. We changed him to [facility] one pill once daily which he has been taking. His serum creatinine nearly normalized and his CD4 count rose as he maintained proper virological suppression.     He then fell out of care and had  not made further follow-up visits with [place] he finally had labs done a few weeks ago which showed his viral load to have skyrocketed above 170,000 CD4 count to have dropped into the 100s. His serum creatinine had also risen above 2.  We switched him over to making sure he gets his meds from [facility] and VL came down to 1k and CD4 to 200. He was admitted to still missing 1-2 doses per week. EMphasized tightening this up.   We then changed him to [facility].  Since the had had VIROLOGICAL FAILURE W RESISTANCE Knee   Changed him to  [facility] and Tivicay --> Prezcobix and BIKTARVY.   He claims he is taking  his antiretroviral medications that we have not had labs on him for several months.  He has some depression today and says he is too much into his feelings.  I asked him [place] about recent precipitants and he indicated that his PlayStation have been still on roughly a week ago.  He is currently living with his brother.  He has not been taking his antihypertensives and did not seem aware of the significance of uncontrolled and untreated hypertension.      Lab Results  Component Value Date   HIV1RNAQUANT 137 (H) 08/17/2017   HIV1RNAQUANT <20 NOT DETECTED 02/09/2017   HIV1RNAQUANT 202 (H) 08/25/2016    Lab Results  Component Value Date   CD4TABS 340 (L) 08/17/2017   CD4TABS 230 (L) 12/28/2016   CD4TABS 280 (L) 08/25/2016    CMP Latest Ref Rng & Units 12/28/2016 08/25/2016 07/27/2016  Glucose 65 - 99 mg/dL 47(L) 80 68  BUN 7 - 25 mg/dL $Remove'13 19 15  'pVHQRAJ$ Creatinine 0.60 - 1.35 mg/dL 1.49(H) 1.90(H) 2.11(H)  Sodium 135 - 146 mmol/L 139 138 139  Potassium 3.5 - 5.3 mmol/L 4.1 4.8 4.2  Chloride 98 - 110 mmol/L 107 106 107  CO2 20 - 31 mmol/L $RemoveB'21 22 26  'OLVEMZIB$ Calcium 8.6 - 10.3 mg/dL 8.3(L) 9.3 8.7  Total Protein 6.1 - 8.1 g/dL 7.0 7.7 -  Total Bilirubin 0.2 - 1.2 mg/dL 1.3(H) 0.8 -  Alkaline Phos 40 - 115 U/L 87 78 -  AST 10 - 40 U/L 21 21 -  ALT 9 - 46 U/L 24 18 -      Past Medical History:  Diagnosis Date  . Atypical chest pain 02/24/2016  . Bronchitis   . CKD (chronic kidney disease) stage 5, GFR less than 15 ml/min (HCC) 02/11/2016  . Hemorrhoids   . HIV (human immunodeficiency virus infection) ([place])   . Lower back pain 02/24/2016  . Poor compliance 02/09/2017    Past Surgical History:  Procedure Laterality Date  . BASCILIC VEIN TRANSPOSITION Left 01/12/2016   Procedure: LEFT ARM FIRST STAGE [place];  Surgeon: Conrad Melville, MD;  Location: [place];  Service: Vascular;  Laterality: Left;  . IR GENERIC HISTORICAL  01/09/2016   IR US GUIDE VASC ACCESS RIGHT 01/09/2016 Marybelle Killings, MD [facility]  . IR GENERIC HISTORICAL  01/09/2016   IR FLUORO GUIDE CV LINE RIGHT 01/09/2016 Marybelle Killings, MD [facility]    No family history on file.    Social History   Socioeconomic History  . Marital status: Single    Spouse name: Not on file  . Number of children: Not on file  . Years of education: Not on file  . Highest education level: Not on file  Occupational History  . Not on file  Social Needs  . Financial resource strain: Not on file  . Food insecurity:    Worry: Not on file    Inability: Not on file  . Transportation needs:    Medical: Not on file    Non-medical: Not on file  Tobacco Use  . Smoking status: Current Some Day Smoker    Packs/day: 0.20  . Smokeless tobacco: Never Used  Substance and Sexual Activity  . Alcohol use: Yes    Comment: rarely  . Drug use: No  . Sexual activity: Never    Partners: Male    Birth control/protection: Condom    Comment: given condoms  Lifestyle  . Physical activity:    Days per week: Not on file    Minutes per session: Not on file  . Stress: Not on file  Relationships  . Social connections:    Talks on phone: Not on file    Gets together: Not on file    Attends religious service: Not on file    Active member of club or organization: Not on file    Attends meetings of clubs or organizations: Not on file    Relationship status: Not on file  Other Topics Concern  . Not on file  Social History Narrative  . Not on file    No Known Allergies   Current Outpatient Medications:  .  Acetaminophen (TYLENOL EXTRA STRENGTH PO), Take by mouth., Disp: , Rfl:  .  bictegravir-emtricitabine-tenofovir AF (BIKTARVY) 50-200-25 MG TABS tablet, Take 1 tablet by mouth daily., Disp: 30 tablet, Rfl: 5 .  darunavir-cobicistat (PREZCOBIX) 800-150 MG tablet, Take 1 tablet by mouth daily with breakfast., Disp: 30 tablet, Rfl: 5 .  triamcinolone cream (KENALOG) 0.1 %, Apply 1 application topically 2 (two) times daily., Disp:  453 g, Rfl: 4 .  valACYclovir (VALTREX) 1000 MG tablet, Take 1 tablet (1,000 mg total) by mouth 2 (two) times daily., Disp: 20 tablet, Rfl: 0 .  amLODipine (NORVASC) 5 MG tablet, Take 1 tablet (5 mg total) by mouth at bedtime. (Patient not taking: Reported on 11/09/2017), Disp: 30 tablet, Rfl: 5 .  furosemide (  LASIX) 80 MG tablet, Take 1 tablet (80 mg total) by mouth daily as needed. (Patient not taking: Reported on 11/09/2017), Disp: 30 tablet, Rfl: 4 .  ondansetron (ZOFRAN-ODT) 8 MG disintegrating tablet, Take 1 tablet (8 mg total) by mouth every 8 (eight) hours as needed for nausea. (Patient not taking: Reported on 11/09/2017), Disp: 12 tablet, Rfl: 0   Review of Systems  Constitutional: Negative for chills, diaphoresis and fever.  HENT: Negative for congestion, hearing loss, sore throat and tinnitus.   Respiratory: Negative for cough, shortness of breath and wheezing.   Cardiovascular: Negative for palpitations.  Gastrointestinal: Negative for abdominal pain, blood in stool, constipation, diarrhea, nausea and vomiting.  Genitourinary: Negative for dysuria, flank pain and hematuria.  Musculoskeletal: Negative for myalgias.  Skin: Negative for rash.  Neurological: Negative for dizziness, weakness and headaches.  Hematological: Does not bruise/bleed easily.  Psychiatric/Behavioral: Positive for decreased concentration and dysphoric mood. Negative for suicidal ideas.       Objective:   Physical Exam  Constitutional: He is oriented to person, place, and time. He appears well-developed and well-nourished. No distress.  HENT:  Head: Normocephalic and atraumatic.  Mouth/Throat: No oropharyngeal exudate.  Eyes: Conjunctivae and EOM are normal. No scleral icterus.  Neck: Normal range of motion. Neck supple.  Cardiovascular: Normal rate, regular rhythm and normal heart sounds. Exam reveals no gallop and no friction rub.  No murmur heard. Pulmonary/Chest: Effort normal and breath sounds normal.  No respiratory distress. He has no wheezes. He has no rales. He exhibits no tenderness.  Abdominal: He exhibits no distension.  Musculoskeletal: He exhibits no tenderness.  Neurological: He is alert and oriented to person, place, and time. He exhibits normal muscle tone. Coordination normal.  Skin: Skin is warm and dry. No rash noted. He is not diaphoretic. No erythema. No pallor.  Psychiatric: His speech is delayed. He is slowed. He exhibits a depressed mood.    AV site is clean       Assessment & Plan:   HIV/AIDS: Continue BIKTARVY with PREZCOBIX and check labs today   Neurosyphilis: Follow RPR titers   HIVAN: I worry very much about this happening again and I worry about his kidney function overall given his poorly controlled hypertension  Hypertension: I will reset his Norvasc at 5 mg and asked him to see pharmacy in a few weeks to tailor his antihypertensive medication again.  Depression: I had him meet with Rollene Fare today again. Consider SSRI again.   CKD: we will hope his HIVAN does not recur BMP Latest Ref Rng & Units 12/28/2016 08/25/2016 07/27/2016  Glucose 65 - 99 mg/dL 47(L) 80 68  BUN 7 - 25 mg/dL $Remove'13 19 15  'etTGgKS$ Creatinine 0.60 - 1.35 mg/dL 1.49(H) 1.90(H) 2.11(H)  Sodium 135 - 146 mmol/L 139 138 139  Potassium 3.5 - 5.3 mmol/L 4.1 4.8 4.2  Chloride 98 - 110 mmol/L 107 106 107  CO2 20 - 31 mmol/L $RemoveB'21 22 26  'ivUbRPwI$ Calcium 8.6 - 10.3 mg/dL 8.3(L) 9.3 8.7   STI screening; check for gentle an extra genital sites for gonorrhea and chlamydia   I spent greater than 25 minutes with the patient including greater than 50% of time in face to face counsel of the patient re the need to taike his ARV and continue to maintain virological suppression to preserve his health also on the importance of managing his blood pressure so that he would not have further damage to his kidneys.  And in coordination of his  care.

## 2017-11-10 LAB — COMPLETE METABOLIC PANEL WITH GFR
AG Ratio: 1.1 (calc) (ref 1.0–2.5)
ALKALINE PHOSPHATASE (APISO): 71 U/L (ref 40–115)
ALT: 18 U/L (ref 9–46)
AST: 16 U/L (ref 10–40)
Albumin: 3.5 g/dL — ABNORMAL LOW (ref 3.6–5.1)
BUN/Creatinine Ratio: 8 (calc) (ref 6–22)
BUN: 16 mg/dL (ref 7–25)
CHLORIDE: 108 mmol/L (ref 98–110)
CO2: 28 mmol/L (ref 20–32)
CREATININE: 1.94 mg/dL — AB (ref 0.60–1.35)
Calcium: 9.2 mg/dL (ref 8.6–10.3)
GFR, EST AFRICAN AMERICAN: 52 mL/min/{1.73_m2} — AB (ref >=60)
GFR, Est Non African American: 45 mL/min/{1.73_m2} — ABNORMAL LOW (ref >=60)
GLUCOSE: 69 mg/dL (ref 65–99)
Globulin: 3.3 g/dL (calc) (ref 1.9–3.7)
Potassium: 4.7 mmol/L (ref 3.5–5.3)
Sodium: 142 mmol/L (ref 135–146)
TOTAL PROTEIN: 6.8 g/dL (ref 6.1–8.1)
Total Bilirubin: 0.4 mg/dL (ref 0.2–1.2)

## 2017-11-10 LAB — CBC WITH DIFFERENTIAL/PLATELET
Basophils Absolute: 60 cells/uL (ref 0–200)
Basophils Relative: 1.3 %
EOS PCT: 11.9 %
Eosinophils Absolute: 547 cells/uL — ABNORMAL HIGH (ref 15–500)
HEMATOCRIT: 47.4 % (ref 38.5–50.0)
HEMOGLOBIN: 16.6 g/dL (ref 13.2–17.1)
LYMPHS ABS: 1615 {cells}/uL (ref 850–3900)
MCH: 32.9 pg (ref 27.0–33.0)
MCHC: 35 g/dL (ref 32.0–36.0)
MCV: 94 fL (ref 80.0–100.0)
MPV: 10.3 fL (ref 7.5–12.5)
Monocytes Relative: 10.8 %
NEUTROS ABS: 1881 {cells}/uL (ref 1500–7800)
Neutrophils Relative %: 40.9 %
Platelets: 292 10*3/uL (ref 140–400)
RBC: 5.04 10*6/uL (ref 4.20–5.80)
RDW: 13.3 % (ref 11.0–15.0)
Total Lymphocyte: 35.1 %
WBC: 4.6 10*3/uL (ref 3.8–10.8)
WBCMIX: 497 {cells}/uL (ref 200–950)

## 2017-11-10 LAB — T-HELPER CELL (CD4) - (RCID CLINIC ONLY)
CD4 T CELL ABS: 240 /uL — AB (ref 400–2700)
CD4 T CELL HELPER: 15 % — AB (ref 33–55)

## 2017-11-10 LAB — RPR: RPR Ser Ql: REACTIVE — AB

## 2017-11-10 LAB — FLUORESCENT TREPONEMAL AB(FTA)-IGG-BLD: Fluorescent Treponemal ABS: REACTIVE — AB

## 2017-11-10 LAB — CYTOLOGY, (ORAL, ANAL, URETHRAL) ANCILLARY ONLY
Chlamydia: NEGATIVE
Chlamydia: POSITIVE — AB
NEISSERIA GONORRHEA: POSITIVE — AB
Neisseria Gonorrhea: NEGATIVE

## 2017-11-10 LAB — RPR TITER: RPR Titer: 1:256 {titer} — ABNORMAL HIGH

## 2017-11-10 NOTE — Addendum Note (Signed)
Addended by: Alberteen Sam on: 11/10/2017 10:24 AM   Modules accepted: Orders

## 2017-11-15 ENCOUNTER — Telehealth: Payer: Self-pay | Admitting: *Deleted

## 2017-11-15 NOTE — Telephone Encounter (Signed)
-----   Message from Thayer Headings, MD sent at 11/11/2017  5:31 PM EDT ----- I am unclear why he needs treatment for neurosyphilis.  If this is your patient Jac Canavan, why are you asking me?  I do not see that you are out, that is next week.   I think he needs 2.4 million units IM Bicillin.  I am not sure of your concern for neurosyphilis. I see he had it before and was treated.     Rob  ----- Message ----- From: Reggy Eye, CMA Sent: 11/11/2017   3:25 PM To: Thayer Headings, MD  Dr Linus Salmons, Per Dr Tommy Medal could you please look at his RPR results and previous treatment and advise what to do for this guy. He gave orders for STD's just not RPR. Darnelle Maffucci ----- Message ----- From: Tommy Medal, Lavell Islam, MD Sent: 11/10/2017   4:46 PM To: [place], [facility] Triage Nurse Glenetta Borg VERY likely has recurrence of Neurosyphilis. He needs to either have LP to be worked up for Neurosyphilis or he will need placement of central line and retreatment for Nsyphilis w 2 weeks of IV PCN

## 2017-11-15 NOTE — Telephone Encounter (Deleted)
-----   Message from Thayer Headings, MD sent at 11/11/2017  5:31 PM EDT ----- I am unclear why he needs treatment for neurosyphilis.  If this is your patient Jac Canavan, why are you asking me?  I do not see that you are out, that is next week.   I think he needs 2.4 million units IM Bicillin.  I am not sure of your concern for neurosyphilis. I see he had it before and was treated.     Rob  ----- Message ----- From: Reggy Eye, CMA Sent: 11/11/2017   3:25 PM To: Thayer Headings, MD  Dr Linus Salmons, Per Dr Tommy Medal could you please look at his RPR results and previous treatment and advise what to do for this guy. He gave orders for STD's just not RPR. Darnelle Maffucci ----- Message ----- From: Tommy Medal, Lavell Islam, MD Sent: 11/10/2017   4:46 PM To: [place], [facility] Triage Nurse Glenetta Borg VERY likely has recurrence of Neurosyphilis. He needs to either have LP to be worked up for Neurosyphilis or he will need placement of central line and retreatment for Nsyphilis w 2 weeks of IV PCN

## 2017-11-15 NOTE — Telephone Encounter (Signed)
Per message from Dr Linus Salmons and Tommy Medal called the patient to advise need for treatment for +STD test. Had to leave a message for him to call the office as soon as possible. He will need 2.4 Bicillin and Ceftriaxone 250 mg and 1G Azithromycin. Will advise him once he call back. SEE NOTE BELOW  Notes recorded by Tommy Medal, Lavell Islam, MD on 11/10/2017 at 11:07 PM EDT He also needs Ceftriaxone 250 mg and 1 g azithromycin and his partners need to be tested and treated for STIs and tested for Hiv and if negative referred to our PrEP clinic

## 2017-11-16 LAB — HIV RNA, RTPCR W/R GT (RTI, PI,INT)
HIV 1 RNA Quant: 25 copies/mL — ABNORMAL HIGH
HIV-1 RNA Quant, Log: 1.4 Log copies/mL — ABNORMAL HIGH

## 2017-11-21 ENCOUNTER — Telehealth: Payer: Self-pay | Admitting: Infectious Disease

## 2017-11-21 NOTE — Telephone Encounter (Signed)
Patient needs to be either evaluated for Neurosyphilis with a LP or treated for it  He has prior KNOWN Neurosyphliis and now with a VERY HIGH RPR  I am sorry that I was not clear about this  Option A  #1  he will need CT head without contrast  Followed by IR guided LP  With CSF sent for   Cell count and diff Protein and glucose CSF VDRL CSF FTA abs  If LP reassuring he could be treated with IM PCN alone  If not then he needs 2 weeks of IV PCN  #2 Have IR place central line and set up with [facility]  And give him 2 weeks of IV penicilllin  I would like patient to make a choice between the 2 and then I can put orders into the computer

## 2017-12-12 ENCOUNTER — Ambulatory Visit: Payer: Self-pay

## 2017-12-12 ENCOUNTER — Ambulatory Visit: Payer: Self-pay | Admitting: Family

## 2017-12-12 NOTE — Progress Notes (Deleted)
Subjective:    Patient ID: Jonathon Moore, male    DOB: 1985/09/09, 32 y.o.   MRN: [id number]  No chief complaint on file.    HPI:  Jonathon Moore is a 32 y.o. male who presents today for a follow up office visit.   Mr. Darroch was last seen in the office on 11/09/17 for HIV follow up and blood work drawn at the time had a positive RPR titer of 1:256. There was concern for neurosyphilis given the high titer and his previous history of neurosyphilis. Through chart review it does not appear that he received treatment.     No Known Allergies    Outpatient Medications Prior to Visit  Medication Sig Dispense Refill  . Acetaminophen (TYLENOL EXTRA STRENGTH PO) Take by mouth.    Marland Kitchen amLODipine (NORVASC) 5 MG tablet Take 1 tablet (5 mg total) by mouth at bedtime. 30 tablet 11  . bictegravir-emtricitabine-tenofovir AF (BIKTARVY) 50-200-25 MG TABS tablet Take 1 tablet by mouth daily. 30 tablet 5  . darunavir-cobicistat (PREZCOBIX) 800-150 MG tablet Take 1 tablet by mouth daily with breakfast. 30 tablet 5  . furosemide (LASIX) 80 MG tablet Take 1 tablet (80 mg total) by mouth daily as needed. (Patient not taking: Reported on 11/09/2017) 30 tablet 4  . ondansetron (ZOFRAN-ODT) 8 MG disintegrating tablet Take 1 tablet (8 mg total) by mouth every 8 (eight) hours as needed for nausea. (Patient not taking: Reported on 11/09/2017) 12 tablet 0  . triamcinolone cream (KENALOG) 0.1 % Apply 1 application topically 2 (two) times daily. 453 g 4  . valACYclovir (VALTREX) 1000 MG tablet Take 1 tablet (1,000 mg total) by mouth 2 (two) times daily. 20 tablet 0   No facility-administered medications prior to visit.      Past Medical History:  Diagnosis Date  . Atypical chest pain 02/24/2016  . Bronchitis   . CKD (chronic kidney disease) stage 5, GFR less than 15 ml/min (HCC) 02/11/2016  . Hemorrhoids   . HIV (human immunodeficiency virus infection) ([place])   . HTN (hypertension) 11/09/2017  .  Lower back pain 02/24/2016  . Poor compliance 02/09/2017     Past Surgical History:  Procedure Laterality Date  . BASCILIC VEIN TRANSPOSITION Left 01/12/2016   Procedure: LEFT ARM FIRST STAGE [place];  Surgeon: Conrad [place], MD;  Location: [place];  Service: Vascular;  Laterality: Left;  . IR GENERIC HISTORICAL  01/09/2016   IR US GUIDE VASC ACCESS RIGHT 01/09/2016 Marybelle Killings, MD [facility]  . IR GENERIC HISTORICAL  01/09/2016   IR FLUORO GUIDE CV LINE RIGHT 01/09/2016 Marybelle Killings, MD [facility]       Review of Systems    Objective:    There were no vitals taken for this visit. Nursing note and vital signs reviewed.  Physical Exam  Constitutional: He is oriented to person, place, and time. He appears well-developed and well-nourished. No distress.  Cardiovascular: Normal rate, regular rhythm, normal heart sounds and intact distal pulses.  Pulmonary/Chest: Effort normal and breath sounds normal.  Neurological: He is alert and oriented to person, place, and time.  Skin: Skin is warm and dry.  Psychiatric: He has a normal mood and affect. His behavior is normal. Judgment and thought content normal.       Assessment & Plan:   Problem List Items Addressed This Visit    None       I am having Jean Rosenthal maintain his Acetaminophen (TYLENOL EXTRA  STRENGTH PO), furosemide, ondansetron, valACYclovir, amLODipine, bictegravir-emtricitabine-tenofovir AF, darunavir-cobicistat, and triamcinolone cream.   No orders of the defined types were placed in this encounter.    Follow-up: No follow-ups on file.   Terri Piedra, MSN, [facility]

## 2018-03-14 ENCOUNTER — Ambulatory Visit: Payer: Self-pay | Admitting: Infectious Disease

## 2018-03-16 ENCOUNTER — Telehealth: Payer: Self-pay | Admitting: Pharmacist

## 2018-03-16 NOTE — Telephone Encounter (Signed)
Sounds good thank you

## 2018-03-16 NOTE — Telephone Encounter (Signed)
Received refill request from [facility] for patient's Biktarvy and Prezcobix but his HMAP has expired.  Called patient to see if he could come in ASAP to renew. He overslept the other day when he missed his appointment. Dr. Tommy Medal has no openings soon and he has seen Colletta Maryland before and is comfortable seeing her again.  Put him on her schedule for next Thursday 10/24 at 11am.  He will need to see Juliann Pulse for [facility] and probably Inez Catalina and myself for medication assistance when he is here.

## 2018-03-23 ENCOUNTER — Encounter: Payer: Self-pay | Admitting: Infectious Diseases

## 2018-03-23 ENCOUNTER — Ambulatory Visit: Payer: Self-pay

## 2018-03-23 ENCOUNTER — Ambulatory Visit (INDEPENDENT_AMBULATORY_CARE_PROVIDER_SITE_OTHER): Payer: Self-pay | Admitting: Infectious Diseases

## 2018-03-23 ENCOUNTER — Telehealth: Payer: Self-pay | Admitting: Pharmacist

## 2018-03-23 VITALS — BP 135/64 | HR 71 | Temp 98.6°F

## 2018-03-23 DIAGNOSIS — Z8619 Personal history of other infectious and parasitic diseases: Secondary | ICD-10-CM

## 2018-03-23 DIAGNOSIS — Z23 Encounter for immunization: Secondary | ICD-10-CM

## 2018-03-23 DIAGNOSIS — B20 Human immunodeficiency virus [HIV] disease: Secondary | ICD-10-CM

## 2018-03-23 NOTE — Assessment & Plan Note (Signed)
HIV 1 RNA Quant (copies/mL)  Date Value  11/09/2017 25 (H)  08/17/2017 137 (H)  02/09/2017 <20 NOT DETECTED   CD4 T Cell Abs (/uL)  Date Value  11/09/2017 240 (L)  08/17/2017 340 (L)  12/28/2016 230 (L)   He has in general demonstrated better control on his medications with his VL < 200. His inability to suppress is due to his adherence I am sure, although he has acquired resistance with poor medication practices. We talked about this again today. Will ask Cassie for help to get him medication assistance. I reminded him that he needs to be more responsible with enrolling in [facility] to avoid these lapses as these options are not endless. He will see Cassie again in January in preparation to renew RW/HMAP again for spring interval. Check VL/CD4 today.

## 2018-03-23 NOTE — Progress Notes (Signed)
Name: Jonathon Moore  DOB: March 20, 1986 MRN: [id number] PCP: Patient, No Pcp Per    Patient Active Problem List   Diagnosis Date Noted  . HTN (hypertension) 11/09/2017  . Herpes infection 07/13/2017  . Poor compliance 02/09/2017  . Atypical chest pain 02/24/2016  . Lower back pain 02/24/2016  . CKD (chronic kidney disease) stage 5, GFR less than 15 ml/min (HCC) 02/11/2016  . Cough   . Headache   . History of syphilis   . AKI (acute kidney injury) ([place])   . AIDS (acquired immune deficiency syndrome) ([place])   . 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 Depression   . Normocytic anemia 01/04/2016  . Poor dentition 01/04/2016  . Acute renal failure ([place]) 01/03/2016  . Metabolic acidosis 64/33/2951  . Diarrhea 01/03/2016  . DOE (dyspnea on exertion)      Subjective:  Jonathon Moore is a 32 y.o. male with HIV infection here today with concern for medication lapse after failing to renew his HMAP/RW again. He has also not followed up with Dr. Tommy Medal as requested.   History notable for neurosyphilis (s/p treatment 14 days IV penicillin); he also nearly required hemodialysis in 2017 however after applying ART his kidney function improved and he was able to avoid this for now. He has been in and out of care since then. He claims he takes his Biktarvy and Prezcobix every day but does not do a good job taking his norvasc consistently. He does not know the name of his blood pressure pill and actually has no recall of nearly ending up on dialysis 2.5 years ago. He is in general not able to tell me much about his history.   He is feeling well and has no complaints today about his health. He denies fevers/chills, headaches, vision changes, hearing changes/ringing in ears, balance problems, changes in memory/personaliy, diarrhea, nausea, vomiting, rashes. He does not recall anyone calling him 5 months ago for syphilis treatment and did not receive treatment or phone calls  pertaining to this.   Review of Systems  Constitutional: Negative for chills and fever.  HENT: Negative for tinnitus.   Eyes: Negative for blurred vision and photophobia.  Respiratory: Negative for cough and sputum production.   Cardiovascular: Negative for chest pain.  Gastrointestinal: Negative for diarrhea, nausea and vomiting.  Genitourinary: Negative for dysuria.  Musculoskeletal: Negative.   Skin: Negative for rash.  Neurological: Negative for dizziness, sensory change, focal weakness and headaches.     Past Medical History:  Diagnosis Date  . Atypical chest pain 02/24/2016  . Bronchitis   . CKD (chronic kidney disease) stage 5, GFR less than 15 ml/min (HCC) 02/11/2016  . Hemorrhoids   . History of syphilis   . HIV (human immunodeficiency virus infection) ([place])   . HTN (hypertension) 11/09/2017  . Lower back pain 02/24/2016  . Poor compliance 02/09/2017    Outpatient Medications Prior to Visit  Medication Sig Dispense Refill  . Acetaminophen (TYLENOL EXTRA STRENGTH PO) Take by mouth.    Marland Kitchen amLODipine (NORVASC) 5 MG tablet Take 1 tablet (5 mg total) by mouth at bedtime. 30 tablet 11  . bictegravir-emtricitabine-tenofovir AF (BIKTARVY) 50-200-25 MG TABS tablet Take 1 tablet by mouth daily. 30 tablet 5  . darunavir-cobicistat (PREZCOBIX) 800-150 MG tablet Take 1 tablet by mouth daily with breakfast. 30 tablet 5  . furosemide (LASIX) 80 MG tablet Take 1 tablet (80 mg total) by mouth daily as needed. (Patient not taking: Reported on 11/09/2017) 30  tablet 4  . ondansetron (ZOFRAN-ODT) 8 MG disintegrating tablet Take 1 tablet (8 mg total) by mouth every 8 (eight) hours as needed for nausea. (Patient not taking: Reported on 11/09/2017) 12 tablet 0  . triamcinolone cream (KENALOG) 0.1 % Apply 1 application topically 2 (two) times daily. 453 g 4  . valACYclovir (VALTREX) 1000 MG tablet Take 1 tablet (1,000 mg total) by mouth 2 (two) times daily. 20 tablet 0   No facility-administered  medications prior to visit.      No Known Allergies  Social History   Tobacco Use  . Smoking status: Current Some Day Smoker    Packs/day: 0.20  . Smokeless tobacco: Never Used  Substance Use Topics  . Alcohol use: Yes    Comment: rarely  . Drug use: No    No family history on file.  Social History   Substance and Sexual Activity  Sexual Activity Never  . Partners: Male  . Birth control/protection: Condom   Comment: given condoms     Objective:   Vitals:   03/23/18 1225  BP: 135/64  Pulse: 71  Temp: 98.6 F (37 C)  TempSrc: Oral   There is no height or weight on file to calculate BMI.  Physical Exam  Constitutional: He is oriented to person, place, and time. He appears well-developed and well-nourished.  Seated comfortably in chair during visit. Appears well today. No acute distress, non-toxic. He does not seem to make connections with medical interventions and impact on his health.   HENT:  Mouth/Throat: Oropharynx is clear and moist and mucous membranes are normal. Normal dentition. No dental abscesses.  Cardiovascular: Normal rate, regular rhythm and normal heart sounds.  Pulmonary/Chest: Breath sounds normal. No respiratory distress.  Abdominal: Soft. He exhibits no distension. There is no tenderness.  Lymphadenopathy:    He has no cervical adenopathy.  Neurological: He is alert and oriented to person, place, and time.  Skin: Skin is warm and dry. No rash noted.  Psychiatric:  Flat affect.  Vitals reviewed.   Lab Results Lab Results  Component Value Date   WBC 4.6 11/09/2017   HGB 16.6 11/09/2017   HCT 47.4 11/09/2017   MCV 94.0 11/09/2017   PLT 292 11/09/2017    Lab Results  Component Value Date   CREATININE 1.94 (H) 11/09/2017   BUN 16 11/09/2017   NA 142 11/09/2017   K 4.7 11/09/2017   CL 108 11/09/2017   CO2 28 11/09/2017    Lab Results  Component Value Date   ALT 18 11/09/2017   AST 16 11/09/2017   ALKPHOS 87 12/28/2016    BILITOT 0.4 11/09/2017    Lab Results  Component Value Date   CHOL 198 12/28/2016   HDL 40 (L) 12/28/2016   LDLCALC 130 (H) 12/28/2016   TRIG 140 12/28/2016   CHOLHDL 5.0 (H) 12/28/2016   HIV 1 RNA Quant (copies/mL)  Date Value  11/09/2017 25 (H)  08/17/2017 137 (H)  02/09/2017 <20 NOT DETECTED   CD4 T Cell Abs (/uL)  Date Value  11/09/2017 240 (L)  08/17/2017 340 (L)  12/28/2016 230 (L)     Assessment & Plan:   Problem List Items Addressed This Visit      Unprioritized   AIDS (acquired immune deficiency syndrome) ([place]) - Primary    HIV 1 RNA Quant (copies/mL)  Date Value  11/09/2017 25 (H)  08/17/2017 137 (H)  02/09/2017 <20 NOT DETECTED   CD4 T Cell Abs (/uL)  Date Value  11/09/2017 240 (L)  08/17/2017 340 (L)  12/28/2016 230 (L)   He has in general demonstrated better control on his medications with his VL < 200. His inability to suppress is due to his adherence I am sure, although he has acquired resistance with poor medication practices. We talked about this again today. Will ask Cassie for help to get him medication assistance. I reminded him that he needs to be more responsible with enrolling in [facility] to avoid these lapses as these options are not endless. He will see Cassie again in January in preparation to renew RW/HMAP again for spring interval. Check VL/CD4 today.       Relevant Orders   RPR   COMPLETE METABOLIC PANEL WITH GFR   HIV-1 RNA quant-no reflex-bld   T-helper cell (CD4)- ([facility] clinic only)   Urine cytology ancillary only   Urinalysis   History of syphilis    Apparently in June his RPR titer rose acutely to 1:256. Our office made efforts to contact him per Dr. Lucianne Lei Dam's direction to have him repeat head CT and LP to r/o neurosyphilis. He never called back. Will check RPR today. No active symptoms of primary or secondary syphilis and his neuro ROS/Exam is normal. I don't think he needs an LP today based on his lack of neurologic symptoms.  Hopefully can treat him with IM bicillin. Will need to repeat STI testing because I also presume he never received gonorrhea treatment.        Other Visit Diagnoses    Need for immunization against influenza       Relevant Orders   Flu Vaccine QUAD 36+ mos IM (Completed)     He will return in January 2020 to see pharmacy team.   Janene Madeira, MSN, NP-C [place] [facility] [place] Pager: [phone] Office: [phone]  03/24/18  12:02 AM

## 2018-03-23 NOTE — Telephone Encounter (Signed)
Patient came in to see Colletta Maryland and is out of medication as of Sunday and his ADAP is expired. He met with Juliann Pulse to renew today.  He was able to get a bottle of Prezcobix in office and I was able to get him an immediate 30 day supply of Biktarvy through [facility]. He will pick up [place] from [place].

## 2018-03-23 NOTE — Patient Instructions (Addendum)
Please set a reminder on your phone for January 1st 2020 to enroll in ADAP again. You need to do this every January and July.   We will see you back in January of 2020 again so we can get you back on schedule to keep your HIV insurance.   You need to take your amlodipine every day - this is for your blood pressure and to protect your kidneys. This is important to avoid dialysis.

## 2018-03-24 ENCOUNTER — Telehealth: Payer: Self-pay | Admitting: *Deleted

## 2018-03-24 ENCOUNTER — Encounter: Payer: Self-pay | Admitting: Infectious Disease

## 2018-03-24 LAB — URINE CYTOLOGY ANCILLARY ONLY
Chlamydia: NEGATIVE
Neisseria Gonorrhea: NEGATIVE

## 2018-03-24 LAB — T-HELPER CELL (CD4) - (RCID CLINIC ONLY)
CD4 T CELL ABS: 260 /uL — AB (ref 400–2700)
CD4 T CELL HELPER: 17 % — AB (ref 33–55)

## 2018-03-24 NOTE — Assessment & Plan Note (Signed)
Apparently in June his RPR titer rose acutely to 1:256. Our office made efforts to contact him per Dr. Lucianne Lei Dam's direction to have him repeat head CT and LP to r/o neurosyphilis. He never called back. Will check RPR today. No active symptoms of primary or secondary syphilis and his neuro ROS/Exam is normal. I don't think he needs an LP today based on his lack of neurologic symptoms. Hopefully can treat him with IM bicillin. Will need to repeat STI testing because I also presume he never received gonorrhea treatment.

## 2018-03-24 NOTE — Telephone Encounter (Signed)
No just the remains two sites. I forgot I added in the urine GC already. Thanks!

## 2018-03-24 NOTE — Progress Notes (Signed)
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 He never had treatment back in June. Would also request to have him do oral/anal/urine swabs for 3-point STI testing for gonorrhea and chlamydia.  With no neurologic symptoms will defer LP for now unless Dr. Tommy Medal would prefer to rule this out.

## 2018-03-24 NOTE — Telephone Encounter (Signed)
RN called patient, verified identity, relayed results. He will come in for the next 3 Mondays (10/28, 11/4, 11/11) for injection of bicillin 2.4 million units x 3.  He will also need oral/anal swabs for further STI testing per [facility]. Urine test 10/24 was negative for gonorrhea/chlamydia. Landis Gandy, RN

## 2018-03-24 NOTE — Telephone Encounter (Signed)
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 He never had treatment back in June. Would also request to have him do oral/anal/urine swabs for 3-point STI testing for gonorrhea and chlamydia.  With no neurologic symptoms will defer LP for now unless Dr. Tommy Medal would prefer to rule this out.

## 2018-03-27 ENCOUNTER — Ambulatory Visit: Payer: Self-pay

## 2018-03-28 LAB — COMPLETE METABOLIC PANEL WITH GFR
AG Ratio: 1.1 (calc) (ref 1.0–2.5)
ALKALINE PHOSPHATASE (APISO): 69 U/L (ref 40–115)
ALT: 21 U/L (ref 9–46)
AST: 17 U/L (ref 10–40)
Albumin: 3.6 g/dL (ref 3.6–5.1)
BILIRUBIN TOTAL: 0.6 mg/dL (ref 0.2–1.2)
BUN / CREAT RATIO: 11 (calc) (ref 6–22)
BUN: 19 mg/dL (ref 7–25)
CHLORIDE: 107 mmol/L (ref 98–110)
CO2: 24 mmol/L (ref 20–32)
CREATININE: 1.73 mg/dL — AB (ref 0.60–1.35)
Calcium: 9 mg/dL (ref 8.6–10.3)
GFR, Est African American: 60 mL/min/{1.73_m2} (ref >=60)
GFR, Est Non African American: 52 mL/min/{1.73_m2} — ABNORMAL LOW (ref >=60)
GLOBULIN: 3.4 g/dL (ref 1.9–3.7)
GLUCOSE: 62 mg/dL — AB (ref 65–99)
Potassium: 4.2 mmol/L (ref 3.5–5.3)
SODIUM: 138 mmol/L (ref 135–146)
Total Protein: 7 g/dL (ref 6.1–8.1)

## 2018-03-28 LAB — HIV-1 RNA QUANT-NO REFLEX-BLD
HIV 1 RNA Quant: 116 copies/mL — ABNORMAL HIGH
HIV-1 RNA Quant, Log: 2.06 Log copies/mL — ABNORMAL HIGH

## 2018-03-28 LAB — RPR: RPR: REACTIVE — AB

## 2018-03-28 LAB — URINALYSIS
Bilirubin Urine: NEGATIVE
GLUCOSE, UA: NEGATIVE
Hgb urine dipstick: NEGATIVE
Ketones, ur: NEGATIVE
LEUKOCYTES UA: NEGATIVE
NITRITE: NEGATIVE
Specific Gravity, Urine: 1.021 (ref 1.001–1.03)
pH: 6 (ref 5.0–8.0)

## 2018-03-28 LAB — RPR TITER: RPR Titer: 1:128 {titer} — ABNORMAL HIGH

## 2018-03-28 LAB — FLUORESCENT TREPONEMAL AB(FTA)-IGG-BLD: Fluorescent Treponemal ABS: REACTIVE — AB

## 2018-04-03 ENCOUNTER — Ambulatory Visit: Payer: Self-pay

## 2018-04-10 ENCOUNTER — Ambulatory Visit: Payer: Self-pay

## 2018-04-18 ENCOUNTER — Other Ambulatory Visit: Payer: Self-pay | Admitting: Pharmacist

## 2018-04-18 DIAGNOSIS — B20 Human immunodeficiency virus [HIV] disease: Secondary | ICD-10-CM

## 2018-04-18 MED ORDER — DARUNAVIR-COBICISTAT 800-150 MG PO TABS: 1.0000 | ORAL_TABLET | Freq: Every day | ORAL | 3 refills | Status: DC

## 2018-04-18 MED ORDER — BICTEGRAVIR-EMTRICITAB-TENOFOV 50-200-25 MG PO TABS: 1.0000 | ORAL_TABLET | Freq: Every day | ORAL | 3 refills | Status: DC

## 2018-06-05 ENCOUNTER — Ambulatory Visit: Payer: Self-pay

## 2018-08-15 ENCOUNTER — Encounter: Payer: Self-pay | Admitting: Infectious Diseases

## 2018-08-15 ENCOUNTER — Ambulatory Visit (INDEPENDENT_AMBULATORY_CARE_PROVIDER_SITE_OTHER): Payer: Self-pay

## 2018-08-15 ENCOUNTER — Other Ambulatory Visit: Payer: Self-pay

## 2018-08-15 ENCOUNTER — Other Ambulatory Visit: Payer: Self-pay | Admitting: *Deleted

## 2018-08-15 ENCOUNTER — Ambulatory Visit: Payer: Self-pay

## 2018-08-15 DIAGNOSIS — A539 Syphilis, unspecified: Secondary | ICD-10-CM

## 2018-08-15 DIAGNOSIS — Z8619 Personal history of other infectious and parasitic diseases: Secondary | ICD-10-CM

## 2018-08-15 DIAGNOSIS — B20 Human immunodeficiency virus [HIV] disease: Secondary | ICD-10-CM

## 2018-08-15 MED ORDER — PENICILLIN G BENZATHINE 1200000 UNIT/2ML IM SUSP
1.2000 10*6.[IU] | Freq: Once | INTRAMUSCULAR | Status: AC
  Administered 2018-08-15: 1.2 10*6.[IU] via INTRAMUSCULAR

## 2018-08-15 NOTE — Progress Notes (Signed)
Jonathon Moore received first dose of 3 does',   2.4 million units Bicillin IM injection today, per Janene Madeira NP. Patient verified identity, tolerated well. Offered and accepted condoms. Hilary was urged to contact recent sexual partners to seek treatment at their local health department and to abstain from all sexual activities at least 1 week after ending treatment.  Patient denied any further questions or concerns.     Lenore Cordia, [place]

## 2018-08-16 LAB — T-HELPER CELL (CD4) - (RCID CLINIC ONLY)
CD4 T CELL ABS: 210 /uL — AB (ref 400–2700)
CD4 T CELL HELPER: 16 % — AB (ref 33–55)

## 2018-08-16 LAB — URINE CYTOLOGY ANCILLARY ONLY
Chlamydia: NEGATIVE
Neisseria Gonorrhea: NEGATIVE

## 2018-08-17 LAB — COMPLETE METABOLIC PANEL WITH GFR
AG Ratio: 1.1 (calc) (ref 1.0–2.5)
ALT: 19 U/L (ref 9–46)
AST: 19 U/L (ref 10–40)
Albumin: 3.5 g/dL — ABNORMAL LOW (ref 3.6–5.1)
Alkaline phosphatase (APISO): 71 U/L (ref 36–130)
BUN/Creatinine Ratio: 10 (calc) (ref 6–22)
BUN: 22 mg/dL (ref 7–25)
CALCIUM: 9.2 mg/dL (ref 8.6–10.3)
CO2: 26 mmol/L (ref 20–32)
Chloride: 105 mmol/L (ref 98–110)
Creat: 2.1 mg/dL — ABNORMAL HIGH (ref 0.60–1.35)
GFR, EST AFRICAN AMERICAN: 48 mL/min/{1.73_m2} — AB (ref >=60)
GFR, Est Non African American: 41 mL/min/{1.73_m2} — ABNORMAL LOW (ref >=60)
Globulin: 3.2 g/dL (calc) (ref 1.9–3.7)
Glucose, Bld: 49 mg/dL — ABNORMAL LOW (ref 65–99)
Potassium: 4 mmol/L (ref 3.5–5.3)
Sodium: 139 mmol/L (ref 135–146)
Total Bilirubin: 0.4 mg/dL (ref 0.2–1.2)
Total Protein: 6.7 g/dL (ref 6.1–8.1)

## 2018-08-17 LAB — CBC WITH DIFFERENTIAL/PLATELET
Absolute Monocytes: 360 cells/uL (ref 200–950)
BASOS ABS: 59 {cells}/uL (ref 0–200)
Basophils Relative: 1.3 %
Eosinophils Absolute: 351 cells/uL (ref 15–500)
Eosinophils Relative: 7.8 %
HCT: 47.7 % (ref 38.5–50.0)
Hemoglobin: 16.1 g/dL (ref 13.2–17.1)
Lymphs Abs: 1206 cells/uL (ref 850–3900)
MCH: 32.1 pg (ref 27.0–33.0)
MCHC: 33.8 g/dL (ref 32.0–36.0)
MCV: 95.2 fL (ref 80.0–100.0)
MONOS PCT: 8 %
MPV: 10.5 fL (ref 7.5–12.5)
Neutro Abs: 2525 cells/uL (ref 1500–7800)
Neutrophils Relative %: 56.1 %
Platelets: 337 10*3/uL (ref 140–400)
RBC: 5.01 10*6/uL (ref 4.20–5.80)
RDW: 13.1 % (ref 11.0–15.0)
Total Lymphocyte: 26.8 %
WBC: 4.5 10*3/uL (ref 3.8–10.8)

## 2018-08-17 LAB — RPR TITER: RPR Titer: 1:64 {titer} — ABNORMAL HIGH

## 2018-08-17 LAB — HIV-1 RNA QUANT-NO REFLEX-BLD
HIV 1 RNA Quant: 681 copies/mL — ABNORMAL HIGH
HIV-1 RNA Quant, Log: 2.83 Log copies/mL — ABNORMAL HIGH

## 2018-08-17 LAB — FLUORESCENT TREPONEMAL AB(FTA)-IGG-BLD: Fluorescent Treponemal ABS: REACTIVE — AB

## 2018-08-17 LAB — RPR: RPR Ser Ql: REACTIVE — AB

## 2018-08-22 ENCOUNTER — Ambulatory Visit: Payer: Self-pay

## 2018-08-31 ENCOUNTER — Ambulatory Visit: Payer: Self-pay | Admitting: Infectious Diseases

## 2018-10-09 ENCOUNTER — Ambulatory Visit: Payer: Self-pay | Admitting: Pharmacist

## 2018-10-26 ENCOUNTER — Ambulatory Visit: Payer: Self-pay | Admitting: Infectious Diseases

## 2018-11-09 ENCOUNTER — Ambulatory Visit (HOSPITAL_COMMUNITY)
Admission: EM | Admit: 2018-11-09 | Discharge: 2018-11-10 | Disposition: A | Discharge status: Home / Self Care | Payer: Self-pay | Attending: Emergency Medicine | Admitting: Emergency Medicine

## 2018-11-09 ENCOUNTER — Emergency Department (HOSPITAL_COMMUNITY): Payer: Self-pay | Admitting: Anesthesiology

## 2018-11-09 ENCOUNTER — Ambulatory Visit (HOSPITAL_COMMUNITY)
Admission: EM | Admit: 2018-11-09 | Discharge: 2018-11-09 | Disposition: A | Discharge status: Home / Self Care | Payer: Self-pay | Attending: Family Medicine | Admitting: Family Medicine

## 2018-11-09 ENCOUNTER — Ambulatory Visit (INDEPENDENT_AMBULATORY_CARE_PROVIDER_SITE_OTHER): Payer: Self-pay

## 2018-11-09 ENCOUNTER — Other Ambulatory Visit: Payer: Self-pay

## 2018-11-09 ENCOUNTER — Telehealth: Payer: Self-pay

## 2018-11-09 ENCOUNTER — Encounter (HOSPITAL_COMMUNITY)
Admission: EM | Disposition: A | Discharge status: Home / Self Care | Payer: Self-pay | Source: Home / Self Care | Attending: Emergency Medicine

## 2018-11-09 ENCOUNTER — Encounter (HOSPITAL_COMMUNITY): Payer: Self-pay | Admitting: Emergency Medicine

## 2018-11-09 ENCOUNTER — Other Ambulatory Visit: Payer: Self-pay | Admitting: Infectious Disease

## 2018-11-09 DIAGNOSIS — K644 Residual hemorrhoidal skin tags: Secondary | ICD-10-CM | POA: Insufficient documentation

## 2018-11-09 DIAGNOSIS — B20 Human immunodeficiency virus [HIV] disease: Secondary | ICD-10-CM

## 2018-11-09 DIAGNOSIS — I120 Hypertensive chronic kidney disease with stage 5 chronic kidney disease or end stage renal disease: Secondary | ICD-10-CM | POA: Insufficient documentation

## 2018-11-09 DIAGNOSIS — N185 Chronic kidney disease, stage 5: Secondary | ICD-10-CM | POA: Insufficient documentation

## 2018-11-09 DIAGNOSIS — T185XXA Foreign body in anus and rectum, initial encounter: Secondary | ICD-10-CM

## 2018-11-09 DIAGNOSIS — X58XXXA Exposure to other specified factors, initial encounter: Secondary | ICD-10-CM | POA: Insufficient documentation

## 2018-11-09 DIAGNOSIS — Z21 Asymptomatic human immunodeficiency virus [HIV] infection status: Secondary | ICD-10-CM | POA: Insufficient documentation

## 2018-11-09 DIAGNOSIS — A630 Anogenital (venereal) warts: Secondary | ICD-10-CM | POA: Insufficient documentation

## 2018-11-09 DIAGNOSIS — Z79899 Other long term (current) drug therapy: Secondary | ICD-10-CM | POA: Insufficient documentation

## 2018-11-09 DIAGNOSIS — N179 Acute kidney failure, unspecified: Secondary | ICD-10-CM

## 2018-11-09 DIAGNOSIS — Z1159 Encounter for screening for other viral diseases: Secondary | ICD-10-CM | POA: Insufficient documentation

## 2018-11-09 DIAGNOSIS — F17210 Nicotine dependence, cigarettes, uncomplicated: Secondary | ICD-10-CM | POA: Insufficient documentation

## 2018-11-09 LAB — CBC WITH DIFFERENTIAL/PLATELET
Abs Immature Granulocytes: 0.04 10*3/uL (ref 0.00–0.07)
Basophils Absolute: 0.1 10*3/uL (ref 0.0–0.1)
Basophils Relative: 1 %
Eosinophils Absolute: 0.2 10*3/uL (ref 0.0–0.5)
Eosinophils Relative: 3 %
HCT: 53.1 % — ABNORMAL HIGH (ref 39.0–52.0)
Hemoglobin: 17.9 g/dL — ABNORMAL HIGH (ref 13.0–17.0)
Immature Granulocytes: 1 %
Lymphocytes Relative: 29 %
Lymphs Abs: 1.8 10*3/uL (ref 0.7–4.0)
MCH: 32.1 pg (ref 26.0–34.0)
MCHC: 33.7 g/dL (ref 30.0–36.0)
MCV: 95.2 fL (ref 80.0–100.0)
Monocytes Absolute: 0.7 10*3/uL (ref 0.1–1.0)
Monocytes Relative: 12 %
Neutro Abs: 3.3 10*3/uL (ref 1.7–7.7)
Neutrophils Relative %: 54 %
Platelets: 321 10*3/uL (ref 150–400)
RBC: 5.58 MIL/uL (ref 4.22–5.81)
RDW: 13.2 % (ref 11.5–15.5)
WBC: 6.1 10*3/uL (ref 4.0–10.5)
nRBC: 0 % (ref 0.0–0.2)

## 2018-11-09 LAB — BASIC METABOLIC PANEL
Anion gap: 10 (ref 5–15)
BUN: 14 mg/dL (ref 6–20)
CO2: 24 mmol/L (ref 22–32)
Calcium: 9 mg/dL (ref 8.9–10.3)
Chloride: 106 mmol/L (ref 98–111)
Creatinine, Ser: 1.93 mg/dL — ABNORMAL HIGH (ref 0.61–1.24)
GFR calc Af Amer: 52 mL/min — ABNORMAL LOW (ref >60)
GFR calc non Af Amer: 45 mL/min — ABNORMAL LOW (ref >60)
Glucose, Bld: 79 mg/dL (ref 70–99)
Potassium: 3.8 mmol/L (ref 3.5–5.1)
Sodium: 140 mmol/L (ref 135–145)

## 2018-11-09 LAB — SARS CORONAVIRUS 2: SARS Coronavirus 2: NOT DETECTED

## 2018-11-09 SURGERY — EXAM UNDER ANESTHESIA
Anesthesia: General | Site: Rectum

## 2018-11-09 MED ORDER — 0.9 % SODIUM CHLORIDE (POUR BTL) OPTIME
TOPICAL | Status: DC | PRN
  Administered 2018-11-09: 22:00:00 1000 mL

## 2018-11-09 MED ORDER — OXYCODONE HCL 5 MG/5ML PO SOLN: 5.0000 mg | Freq: Once | ORAL | Status: DC | PRN

## 2018-11-09 MED ORDER — FENTANYL CITRATE (PF) 250 MCG/5ML IJ SOLN
INTRAMUSCULAR | Status: DC | PRN
  Administered 2018-11-09: 100 ug via INTRAVENOUS

## 2018-11-09 MED ORDER — BUPIVACAINE-EPINEPHRINE (PF) 0.5% -1:200000 IJ SOLN
INTRAMUSCULAR | Status: AC
  Filled 2018-11-09: qty 30

## 2018-11-09 MED ORDER — OXYCODONE HCL 5 MG PO TABS: 5.0000 mg | ORAL_TABLET | Freq: Once | ORAL | Status: DC | PRN

## 2018-11-09 MED ORDER — MIDAZOLAM HCL 5 MG/5ML IJ SOLN
INTRAMUSCULAR | Status: DC | PRN
  Administered 2018-11-09: 2 mg via INTRAVENOUS

## 2018-11-09 MED ORDER — LIDOCAINE 2% (20 MG/ML) 5 ML SYRINGE
INTRAMUSCULAR | Status: DC | PRN
  Administered 2018-11-09: 60 mg via INTRAVENOUS

## 2018-11-09 MED ORDER — CEFAZOLIN SODIUM-DEXTROSE 2-4 GM/100ML-% IV SOLN
2.0000 g | INTRAVENOUS | Status: AC
  Administered 2018-11-09: 23:00:00 2 g via INTRAVENOUS
  Filled 2018-11-09: qty 100

## 2018-11-09 MED ORDER — DEXAMETHASONE SODIUM PHOSPHATE 10 MG/ML IJ SOLN
INTRAMUSCULAR | Status: DC | PRN
  Administered 2018-11-09: 10 mg via INTRAVENOUS

## 2018-11-09 MED ORDER — GLYCOPYRROLATE 0.2 MG/ML IJ SOLN
INTRAMUSCULAR | Status: DC | PRN
  Administered 2018-11-09: 0.2 mg via INTRAVENOUS

## 2018-11-09 MED ORDER — HYDROMORPHONE HCL 1 MG/ML IJ SOLN: 0.2500 mg | INTRAMUSCULAR | Status: DC | PRN

## 2018-11-09 MED ORDER — PROPOFOL 10 MG/ML IV BOLUS
INTRAVENOUS | Status: AC
  Filled 2018-11-09: qty 20

## 2018-11-09 MED ORDER — ONDANSETRON HCL 4 MG/2ML IJ SOLN
INTRAMUSCULAR | Status: DC | PRN
  Administered 2018-11-09: 4 mg via INTRAVENOUS

## 2018-11-09 MED ORDER — PROPOFOL 10 MG/ML IV BOLUS
INTRAVENOUS | Status: DC | PRN
  Administered 2018-11-09: 200 mg via INTRAVENOUS

## 2018-11-09 MED ORDER — SODIUM CHLORIDE 0.9 % IV BOLUS
1000.0000 mL | Freq: Once | INTRAVENOUS | Status: AC
  Administered 2018-11-09: 22:00:00 1000 mL via INTRAVENOUS

## 2018-11-09 MED ORDER — PROMETHAZINE HCL 25 MG/ML IJ SOLN: 6.2500 mg | INTRAMUSCULAR | Status: DC | PRN

## 2018-11-09 MED ORDER — LACTATED RINGERS IV SOLN
INTRAVENOUS | Status: DC | PRN
  Administered 2018-11-09: 22:00:00 via INTRAVENOUS

## 2018-11-09 MED ORDER — MIDAZOLAM HCL 2 MG/2ML IJ SOLN
INTRAMUSCULAR | Status: AC
  Filled 2018-11-09: qty 2

## 2018-11-09 MED ORDER — FENTANYL CITRATE (PF) 250 MCG/5ML IJ SOLN
INTRAMUSCULAR | Status: AC
  Filled 2018-11-09: qty 5

## 2018-11-09 SURGICAL SUPPLY — 32 items
CANISTER SUCT 3000ML PPV (MISCELLANEOUS) ×1 IMPLANT
COVER SURGICAL LIGHT HANDLE (MISCELLANEOUS) ×3 IMPLANT
COVER WAND RF STERILE (DRAPES) ×3 IMPLANT
DECANTER SPIKE VIAL GLASS SM (MISCELLANEOUS) ×1 IMPLANT
DRAPE LAPAROTOMY 100X72 PEDS (DRAPES) ×2 IMPLANT
DRSG PAD ABDOMINAL 8X10 ST (GAUZE/BANDAGES/DRESSINGS) ×1 IMPLANT
ELECT REM PT RETURN 9FT ADLT (ELECTROSURGICAL) ×3
ELECTRODE REM PT RTRN 9FT ADLT (ELECTROSURGICAL) ×1 IMPLANT
GAUZE 4X4 16PLY RFD (DISPOSABLE) ×1 IMPLANT
GAUZE SPONGE 4X4 12PLY STRL (GAUZE/BANDAGES/DRESSINGS) ×3 IMPLANT
GLOVE BIOGEL M STRL SZ7.5 (GLOVE) ×3 IMPLANT
GLOVE INDICATOR 8.0 STRL GRN (GLOVE) ×9 IMPLANT
GOWN STRL REUS W/ TWL LRG LVL3 (GOWN DISPOSABLE) ×1 IMPLANT
GOWN STRL REUS W/TWL 2XL LVL3 (GOWN DISPOSABLE) ×3 IMPLANT
GOWN STRL REUS W/TWL LRG LVL3 (GOWN DISPOSABLE) ×3
KIT BASIN OR (CUSTOM PROCEDURE TRAY) ×3 IMPLANT
KIT TURNOVER KIT B (KITS) ×3 IMPLANT
NDL HYPO 25GX1X1/2 BEV (NEEDLE) ×1 IMPLANT
NEEDLE HYPO 25GX1X1/2 BEV (NEEDLE) IMPLANT
NS IRRIG 1000ML POUR BTL (IV SOLUTION) ×3 IMPLANT
PACK GENERAL/GYN (CUSTOM PROCEDURE TRAY) ×2 IMPLANT
PACK LITHOTOMY IV (CUSTOM PROCEDURE TRAY) ×2 IMPLANT
PAD ARMBOARD 7.5X6 YLW CONV (MISCELLANEOUS) ×6 IMPLANT
PENCIL SMOKE EVACUATOR (MISCELLANEOUS) ×3 IMPLANT
SPONGE SURGIFOAM ABS GEL 12-7 (HEMOSTASIS) IMPLANT
SURGILUBE 2OZ TUBE FLIPTOP (MISCELLANEOUS) ×3 IMPLANT
SUT CHROMIC 2 0 SH (SUTURE) IMPLANT
SUT CHROMIC 3 0 SH 27 (SUTURE) IMPLANT
SYR CONTROL 10ML LL (SYRINGE) ×1 IMPLANT
TOWEL OR 17X24 6PK STRL BLUE (TOWEL DISPOSABLE) ×3 IMPLANT
TOWEL OR 17X26 10 PK STRL BLUE (TOWEL DISPOSABLE) ×3 IMPLANT
TRAY PROCTOSCOPIC FIBER OPTIC (SET/KITS/TRAYS/PACK) ×3 IMPLANT

## 2018-11-09 NOTE — H&P (Signed)
Jonathon Moore is an 33 y.o. male.   Chief Complaint: rectal foreign body HPI: 33 yo male with HIV, HTN, CKD went to UC earlier c/o vibrator stuck in rectum.  Placed early this am. Unable to remove. Tried OTC laxative without passing vibrator. Has had flatus and a little mucous. No fever, chills, n/v/abd/rectal pain.   Take BP pill daily Has missed some doses of HIV No tob/drugs.  Past Medical History:  Diagnosis Date  . Atypical chest pain 02/24/2016  . Bronchitis   . CKD (chronic kidney disease) stage 5, GFR less than 15 ml/min (HCC) 02/11/2016  . Hemorrhoids   . History of syphilis   . HIV (human immunodeficiency virus infection) ([place])   . HTN (hypertension) 11/09/2017  . Lower back pain 02/24/2016  . Poor compliance 02/09/2017    Past Surgical History:  Procedure Laterality Date  . BASCILIC VEIN TRANSPOSITION Left 01/12/2016   Procedure: LEFT ARM FIRST STAGE [place];  Surgeon: Conrad Potala Pastillo, MD;  Location: [place];  Service: Vascular;  Laterality: Left;  . IR GENERIC HISTORICAL  01/09/2016   IR US GUIDE VASC ACCESS RIGHT 01/09/2016 Marybelle Killings, MD [facility]  . IR GENERIC HISTORICAL  01/09/2016   IR FLUORO GUIDE CV LINE RIGHT 01/09/2016 Marybelle Killings, MD [facility]    Family History  Problem Relation Age of Onset  . Healthy Mother    Social History:  reports that he has been smoking. He has been smoking about 0.20 packs per day. He has never used smokeless tobacco. He reports current alcohol use. He reports current drug use. Drug: Marijuana.  Allergies: No Known Allergies  (Not in a hospital admission)   Results for orders placed or performed during the hospital encounter of 11/09/18 (from the past 48 hour(s))  CBC with Differential/Platelet     Status: Abnormal   Collection Time: 11/09/18  7:34 PM  Result Value Ref Range   WBC 6.1 4.0 - 10.5 K/uL   RBC 5.58 4.22 - 5.81 MIL/uL   Hemoglobin 17.9 (H) 13.0 - 17.0 g/dL   HCT 53.1 (H) 39.0 - 52.0 %    MCV 95.2 80.0 - 100.0 fL   MCH 32.1 26.0 - 34.0 pg   MCHC 33.7 30.0 - 36.0 g/dL   RDW 13.2 11.5 - 15.5 %   Platelets 321 150 - 400 K/uL   nRBC 0.0 0.0 - 0.2 %   Neutrophils Relative % 54 %   Neutro Abs 3.3 1.7 - 7.7 K/uL   Lymphocytes Relative 29 %   Lymphs Abs 1.8 0.7 - 4.0 K/uL   Monocytes Relative 12 %   Monocytes Absolute 0.7 0.1 - 1.0 K/uL   Eosinophils Relative 3 %   Eosinophils Absolute 0.2 0.0 - 0.5 K/uL   Basophils Relative 1 %   Basophils Absolute 0.1 0.0 - 0.1 K/uL   Immature Granulocytes 1 %   Abs Immature Granulocytes 0.04 0.00 - 0.07 K/uL    Comment: Performed at [facility], [address], [place], [place] [address]  Basic metabolic panel     Status: Abnormal   Collection Time: 11/09/18  7:34 PM  Result Value Ref Range   Sodium 140 135 - 145 mmol/L   Potassium 3.8 3.5 - 5.1 mmol/L   Chloride 106 98 - 111 mmol/L   CO2 24 22 - 32 mmol/L   Glucose, Bld 79 70 - 99 mg/dL   BUN 14 6 - 20 mg/dL   Creatinine, Ser 1.93 (  H) 0.61 - 1.24 mg/dL   Calcium 9.0 8.9 - 10.3 mg/dL   GFR calc non Af Amer 45 (L) >60 mL/min   GFR calc Af Amer 52 (L) >60 mL/min   Anion gap 10 5 - 15    Comment: Performed at [facility] [address], [place], [place] [address]  SARS Coronavirus 2     Status: None   Collection Time: 11/09/18  7:34 PM  Result Value Ref Range   SARS Coronavirus 2 NOT DETECTED NOT DETECTED    Comment: (NOTE) SARS-CoV-2 target nucleic acids are NOT DETECTED. The SARS-CoV-2 RNA is generally detectable in upper and lower respiratory specimens during the acute phase of infection.  Negative  results do not preclude SARS-CoV-2 infection, do not rule out co-infections with other pathogens, and should not be used as the sole basis for treatment or other patient management decisions.  Negative results must be combined with clinical observations, patient history, and epidemiological information. The expected result is Not Detected. Fact Sheet for  Patients: http://www.biofiredefense.com/wp-content/uploads/2020/03/BIOFIRE-COVID -19-patients.pdf Fact Sheet for Healthcare Providers: http://www.biofiredefense.com/wp-content/uploads/2020/03/BIOFIRE-COVID -19-hcp.pdf This test is not yet approved or cleared by the [place] and  has been authorized for detection and/or diagnosis of SARS-CoV-2 by FDA under an Emergency Use Authorization (EUA).  This EUA will remain in effec t (meaning this test can be used) for the duration of  the COVID-19 declaration under Section 564(b)(1) of the Act, 21 U.S.C. section 360bbb-3(b)(1), unless the authorization is terminated or revoked sooner. Performed at [facility], [place] [address], [place], [place] [address]    Dg Abdomen 1 View  Result Date: 11/09/2018 CLINICAL DATA:  Foreign body in rectum. EXAM: ABDOMEN - 1 VIEW COMPARISON:  None. FINDINGS: The bowel gas pattern is normal. Large electrical device is seen in the rectum. Phleboliths are noted in the pelvis. IMPRESSION: Large electrical device consistent with vibrator is noted in the expected position of the rectum. Electronically Signed   By: Marijo Conception M.D.   On: 11/09/2018 18:36    Review of Systems  All other systems reviewed and are negative.   Blood pressure (!) 165/112, pulse 78, temperature 99.3 F (37.4 C), temperature source Oral, resp. rate 14, SpO2 98 %. Physical Exam  Vitals reviewed. Constitutional: He is oriented to person, place, and time. He appears well-developed and well-nourished. No distress.  HENT:  Head: Normocephalic and atraumatic.  Right Ear: External ear normal.  Left Ear: External ear normal.  Eyes: Conjunctivae are normal. No scleral icterus.  Neck: Normal range of motion. Neck supple. No tracheal deviation present. No thyromegaly present.  Cardiovascular: Normal rate and normal heart sounds.  Respiratory: Effort normal and breath sounds normal. No stridor. No respiratory distress. He has no  wheezes.  GI: Soft. He exhibits no distension. There is no abdominal tenderness. There is no rebound.  Genitourinary:    Genitourinary Comments: Chaperone present; anal skin tag- possible wart. DRE reveals foreign body probably about 8 cm. Unable to extract.   Musculoskeletal:        General: No tenderness or edema.  Lymphadenopathy:    He has no cervical adenopathy.  Neurological: He is alert and oriented to person, place, and time. He exhibits normal muscle tone.  Skin: Skin is warm and dry. No rash noted. He is not diaphoretic. No erythema. No pallor.  Psychiatric: He has a normal mood and affect. His behavior is normal. Judgment and thought content normal.     Assessment/Plan  Rectal foreign body HIV HTN CKD  No signs of perforation or toxicity. No abd pain. No fever. No wbc.   To OR for EUA, removal foreign body; possible rigid proctoscopy Discussed risk/benefits - bleeding, infection, injury to surrounding structures, perforation, perioperative cardiac/pulm events, possible diagnostic lap (very unlikely)  Likely dc from pacu Has someone to stay with  Doristine Johns - [phone]  Greer Pickerel, MD 11/09/2018, 9:15 PM

## 2018-11-09 NOTE — Anesthesia Procedure Notes (Signed)
Procedure Name: LMA Insertion Date/Time: 11/09/2018 10:31 PM Performed by: Shirlyn Goltz, CRNA Pre-anesthesia Checklist: Patient identified, Emergency Drugs available, Suction available and Patient being monitored Patient Re-evaluated:Patient Re-evaluated prior to induction Oxygen Delivery Method: Circle system utilized Preoxygenation: Pre-oxygenation with 100% oxygen Induction Type: IV induction Ventilation: Mask ventilation without difficulty LMA: LMA inserted LMA Size: 5.0 Number of attempts: 1 Placement Confirmation: positive ETCO2 and breath sounds checked- equal and bilateral Tube secured with: Tape Dental Injury: Teeth and Oropharynx as per pre-operative assessment

## 2018-11-09 NOTE — Op Note (Signed)
11/09/2018  11:11 PM  PATIENT:  Jonathon Moore  33 y.o. male  PRE-OPERATIVE DIAGNOSIS:  rectal foreign body  POST-OPERATIVE DIAGNOSIS:  rectal foreign body, anal condyloma  PROCEDURE:  Procedure(s): EXAM UNDER ANESTHESIA, ANOSCOPY  AND REMOVAL OF RECTAL FOREIGN BODY/ ANOSCOPY  SURGEON:  Surgeon(s): Greer Pickerel, MD  ASSISTANTS: none   ANESTHESIA:   general  DRAINS: none   LOCAL MEDICATIONS USED:  NONE  SPECIMEN:  Source of Specimen:  rectal foreign body - vibrator  DISPOSITION OF SPECIMEN:  given back to patient on discharge  COUNTS:  YES  INDICATION FOR PROCEDURE: 33 year old gentleman with HIV who inserted a vibrator early this morning.  He was not able to remove it.  He tried over-the-counter remedies without success.  He went to urgent care where radiograph was confirmed to show a rectal foreign body consistent with a vibrator.  He came to the emergency room for further evaluation.  I was unable to remove it on digital rectal exam in the emergency room so I recommended exam under anesthesia  PROCEDURE: After obtaining informed consent the patient was brought to operating room [facility] at [facility].  General LMA anesthesia was established.  Sequential compression devices were placed.  He was placed in lithotomy position with the appropriate padding.  Perineum was prepped and draped in the usual standard surgical fashion with Betadine.  Surgical timeout was performed.  He did receive IV Ancef before beginning of the procedure mainly because of his immunocompromised status and a foreign body.  Visual inspection of the anus revealed an external hemorrhoidal tag with a small wart on it.  Digital rectal exam revealed a palpable foreign body within the rectal vault.  Anoscopy was performed which demonstrated no significant trauma to the anal canal mucosa.  There were signs of internal anal warts on the left side of the anal canal.  He did have some engorged hemorrhoidal  tissue.  I was then able to manually extract and remove the foreign body in its entirety.  There was a condom covering the vibrator.  No bleeding.  All counts correct.  Patient was extubated and taken the recovery in stable condition no immediate complications.  I discussed his discharge instructions with him preoperatively because I felt that he would be able to be discharged from recovery room.  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  PLAN OF CARE: Discharge to home after PACU  PATIENT DISPOSITION:  PACU - hemodynamically stable.   Delay start of Pharmacological VTE agent (>24hrs) due to surgical blood loss or risk of bleeding:  not applicable  Leighton Ruff. Redmond Pulling, MD, FACS General, Bariatric, & Minimally Invasive Surgery [facility], [place]

## 2018-11-09 NOTE — ED Provider Notes (Signed)
[facility] EMERGENCY DEPARTMENT Provider Note   CSN: [id number] Arrival date & time: 11/09/18  1845     History   Chief Complaint No chief complaint on file.   HPI Jonathon Moore is a 33 y.o. male.     The history is provided by the patient. No language interpreter was used.     33 year old male with history of HIV, syphilis, herpes, presenting for evaluation of foreign body in rectum.  Patient report he inserted a vibrating dildo sex toy in rectum at [facility] today and have not been able to retrieve it. Sts the vibrator has stopped. He tried laxative without relief. He denies any fever, nausea, vomiting, abd pain, or rectal bleeding.  This has not happened in the past.  His last meal was 2 hrs ago.  He is unsure how well control is his HIV status.  No other complaint. No COVID sxs.   Past Medical History:  Diagnosis Date  . Atypical chest pain 02/24/2016  . Bronchitis   . CKD (chronic kidney disease) stage 5, GFR less than 15 ml/min (HCC) 02/11/2016  . Hemorrhoids   . History of syphilis   . HIV (human immunodeficiency virus infection) ([place])   . HTN (hypertension) 11/09/2017  . Lower back pain 02/24/2016  . Poor compliance 02/09/2017    Patient Active Problem List   Diagnosis Date Noted  . HTN (hypertension) 11/09/2017  . Herpes infection 07/13/2017  . Poor compliance 02/09/2017  . Atypical chest pain 02/24/2016  . Lower back pain 02/24/2016  . CKD (chronic kidney disease) stage 5, GFR less than 15 ml/min (HCC) 02/11/2016  . Cough   . Headache   . History of syphilis   . AKI (acute kidney injury) ([place])   . AIDS (acquired immune deficiency syndrome) ([place])   . 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 Depression   . Normocytic anemia 01/04/2016  . Poor dentition 01/04/2016  . Acute renal failure ([place]) 01/03/2016  . Metabolic acidosis 40/97/3532  . Diarrhea 01/03/2016  . DOE (dyspnea on exertion)     Past Surgical History:  Procedure  Laterality Date  . BASCILIC VEIN TRANSPOSITION Left 01/12/2016   Procedure: LEFT ARM FIRST STAGE [place];  Surgeon: Conrad Culbertson, MD;  Location: [facility];  Service: Vascular;  Laterality: Left;  . IR GENERIC HISTORICAL  01/09/2016   IR US GUIDE VASC ACCESS RIGHT 01/09/2016 Marybelle Killings, MD [facility]  . IR GENERIC HISTORICAL  01/09/2016   IR FLUORO GUIDE CV LINE RIGHT 01/09/2016 Marybelle Killings, MD [facility]        Home Medications    Prior to Admission medications   Medication Sig Start Date End Date Taking? Authorizing Provider  Acetaminophen (TYLENOL EXTRA STRENGTH PO) Take by mouth.    [provider]  amLODipine (NORVASC) 5 MG tablet Take 1 tablet (5 mg total) by mouth at bedtime. 11/09/17   Truman Hayward, MD  bictegravir-emtricitabine-tenofovir AF (BIKTARVY) 50-200-25 MG TABS tablet Take 1 tablet by mouth daily. 04/18/18   Glen Raven Callas, NP  darunavir-cobicistat (PREZCOBIX) 800-150 MG tablet Take 1 tablet by mouth daily with breakfast. 04/18/18   Shevlin Callas, NP  furosemide (LASIX) 80 MG tablet Take 1 tablet (80 mg total) by mouth daily as needed. Patient not taking: Reported on 11/09/2017 06/30/16   Tommy Medal, Lavell Islam, MD  ondansetron (ZOFRAN-ODT) 8 MG disintegrating tablet Take 1 tablet (8 mg total) by mouth every 8 (eight) hours as  needed for nausea. Patient not taking: Reported on 11/09/2017 10/18/16   Robyn Haber, MD  triamcinolone cream (KENALOG) 0.1 % Apply 1 application topically 2 (two) times daily. 11/09/17   Truman Hayward, MD  valACYclovir (VALTREX) 1000 MG tablet Take 1 tablet (1,000 mg total) by mouth 2 (two) times daily. 08/17/17   Kuppelweiser, Cassie L, RPH-CPP    Family History Family History  Problem Relation Age of Onset  . Healthy Mother     Social History Social History   Tobacco Use  . Smoking status: Current Some Day Smoker    Packs/day: 0.20  . Smokeless tobacco: Never Used  Substance Use Topics   . Alcohol use: Yes    Comment: rarely  . Drug use: Yes    Types: Marijuana     Allergies   Patient has no known allergies.   Review of Systems Review of Systems  All other systems reviewed and are negative.    Physical Exam Updated Vital Signs BP (!) 165/112 (BP Location: Left Arm)   Pulse 78   Temp 99.3 F (37.4 C) (Oral)   Resp 14   SpO2 98%   Physical Exam Vitals signs and nursing note reviewed.  Constitutional:      General: He is not in acute distress.    Appearance: He is well-developed.  HENT:     Head: Atraumatic.  Eyes:     Conjunctiva/sclera: Conjunctivae normal.  Neck:     Musculoskeletal: Neck supple.  Cardiovascular:     Rate and Rhythm: Normal rate and regular rhythm.     Pulses: Normal pulses.     Heart sounds: Normal heart sounds.  Pulmonary:     Effort: Pulmonary effort is normal.     Breath sounds: Normal breath sounds.  Abdominal:     General: Abdomen is flat.     Palpations: Abdomen is soft.     Tenderness: There is no abdominal tenderness.  Genitourinary:    Comments: Chaperone present during exam.  Normal rectal tone, evidence of hemorrhoids and skin tag.  Normal color stool on glove, no frank blood, no foreign body palpated. Skin:    Findings: No rash.  Neurological:     Mental Status: He is alert.      ED Treatments / Results  Labs (all labs ordered are listed, but only abnormal results are displayed) Labs Reviewed  CBC WITH DIFFERENTIAL/PLATELET - Abnormal; Notable for the following components:      Result Value   Hemoglobin 17.9 (*)    HCT 53.1 (*)    All other components within normal limits  BASIC METABOLIC PANEL - Abnormal; Notable for the following components:   Creatinine, Ser 1.93 (*)    GFR calc non Af Amer 45 (*)    GFR calc Af Amer 52 (*)    All other components within normal limits  SARS CORONAVIRUS 2  T-HELPER CELLS (CD4) COUNT (NOT AT [facility])    EKG    Radiology Dg Abdomen 1 View  Result Date:  11/09/2018 CLINICAL DATA:  Foreign body in rectum. EXAM: ABDOMEN - 1 VIEW COMPARISON:  None. FINDINGS: The bowel gas pattern is normal. Large electrical device is seen in the rectum. Phleboliths are noted in the pelvis. IMPRESSION: Large electrical device consistent with vibrator is noted in the expected position of the rectum. Electronically Signed   By: Marijo Conception M.D.   On: 11/09/2018 18:36    Procedures Procedures (including critical care time)  Medications Ordered in  ED Medications  oxyCODONE (Oxy IR/ROXICODONE) immediate release tablet 5 mg (has no administration in time range)    Or  oxyCODONE (ROXICODONE) 5 MG/5ML solution 5 mg (has no administration in time range)  HYDROmorphone (DILAUDID) injection 0.25-0.5 mg (has no administration in time range)  promethazine (PHENERGAN) injection 6.25-12.5 mg (has no administration in time range)  sodium chloride 0.9 % bolus 1,000 mL (1,000 mLs Intravenous New Bag/Given 11/09/18 2147)  ceFAZolin (ANCEF) IVPB 2g/100 mL premix (2 g Intravenous Given 11/09/18 2237)  bupivacaine-epinephrine (MARCAINE W/ EPI) 0.5% -1:200000 injection (has no administration in time range)  fentaNYL (SUBLIMAZE) 250 MCG/5ML injection (has no administration in time range)  midazolam (VERSED) 2 MG/2ML injection (has no administration in time range)  propofol (DIPRIVAN) 10 mg/mL bolus/IV push (has no administration in time range)     Initial Impression / Assessment and Plan / ED Course  I have reviewed the triage vital signs and the nursing notes.  Pertinent labs & imaging results that were available during my care of the patient were reviewed by me and considered in my medical decision making (see chart for details).        BP 125/81 (BP Location: Right Arm)   Pulse 85   Temp 97.9 F (36.6 C)   Resp 20   SpO2 94%    Final Clinical Impressions(s) / ED Diagnoses   Final diagnoses:  Foreign body of rectum, initial encounter    ED Discharge Orders          Ordered    Discharge instructions    Comments: See [facility] discharge instructions   11/09/18 2320    Increase activity slowly     11/09/18 2320    Call MD for:    Comments: Temperature >101   11/09/18 2320    Call MD for:  persistant nausea and vomiting     11/09/18 2320    Call MD for:  severe uncontrolled pain     11/09/18 2320    Call MD for:  redness, tenderness, or signs of infection (pain, swelling, redness, odor or green/yellow discharge around incision site)     11/09/18 2320    Call MD for:  hives     11/09/18 2320    Call MD for:  persistant dizziness or light-headedness     11/09/18 2320    Diet general     11/09/18 2320         7:39 PM Patient here for foreign body in rectum.  He inserted a vibrator earlier this morning and unable to retrieve it.  At this time he does not complain of any significant pain concerning for perforation.  X-ray did confirm evidence of foreign body per rectum.  I am unable to retrieve it on digital rectal exam.  I did consult on-call surgeon, Dr. Redmond Pulling, who recommend obtaining basic labs and anticipate patient will have his procedure around midnight or tomorrow.  At this time patient is resting comfortably and in no acute discomfort. Will also order COVID test.   Ernst Bowler was evaluated in Emergency Department on 11/09/2018 for the symptoms described in the history of present illness. He was evaluated in the context of the global COVID-19 pandemic, which necessitated consideration that the patient might be at risk for infection with the SARS-CoV-2 virus that causes COVID-19. Institutional protocols and algorithms that pertain to the evaluation of patients at risk for COVID-19 are in a state of rapid change based on information released by regulatory bodies including  the [employer] and federal and state organizations. These policies and algorithms were followed during the patient's care in the ED.    Domenic Moras, PA-C 11/09/18 2340     Little, Wenda Overland, MD 11/10/18 0010

## 2018-11-09 NOTE — OR Nursing (Signed)
Item retrieved from rectal area  wrapped in towel, placed in biohazard bag, placed in patients personal belongings and sent to recovery room with patient.

## 2018-11-09 NOTE — Discharge Instructions (Signed)
[facility] _______[facility]   POST OP INSTRUCTIONS  Always review your discharge instruction sheet given to you by the facility where your surgery was performed. IF YOU HAVE DISABILITY OR FAMILY LEAVE FORMS, YOU MUST BRING THEM TO THE OFFICE FOR PROCESSING.   DO NOT GIVE THEM TO YOUR DOCTOR.  1. you may take acetaminophen (Tylenol) or ibuprofen (Advil) as needed. 2. Take your usually prescribed medications unless otherwise directed. 3. If you need a refill on your pain medication, please contact your pharmacy.  They will contact our office to request authorization. Prescriptions will not be filled after 5 pm or on week-ends. 4. You should follow a light diet the first 48 hours after arrival home, such as soup and crackers, etc.  Be sure to include lots of fluids daily.  Resume your normal diet 2-3 days after surgery.. 5. Most patients will experience some swelling and discomfort in the rectal area. Ice packs, reclining and warm tub soaks will help.  Swelling and discomfort can take several days to resolve.  6. It is common to experience some constipation if taking pain medication after surgery.  Increasing fluid intake and taking a stool softener (such as Colace) will usually help or prevent this problem from occurring.  A mild laxative (Milk of Magnesia or Miralax) should be taken according to package directions if there are no bowel movements after 48 hours. 7. Unless discharge instructions indicate otherwise, leave your bandage dry and in place for 24 hours, or remove the bandage if you have a bowel movement. You may notice a small amount of bleeding with bowel movements for the first few days. You may have some packing in the rectum which will come out over the first day or two. You will need to wear an absorbent pad or soft cotton gauze in your underwear until the drainage stops.it. 8. ACTIVITIES:  You may resume regular (light) daily activities beginning the next day--such as daily  self-care, walking, climbing stairs--gradually increasing activities as tolerated.  You may have sexual intercourse when it is comfortable.  Refrain from any heavy lifting or straining until approved by your doctor. a. You may drive when you are no longer taking prescription pain medication, you can comfortably wear a seatbelt, and you can safely maneuver your car and apply brakes. b. RETURN TO WORK: : ____________________ c.  9. You should see your doctor in the office for a follow-up appointment approximately 3-4 weeks after your surgery.  Make sure that you call for this appointment within a day or two after you arrive home to insure a convenient appointment time. 10. OTHER INSTRUCTIONS:  __________________________________________________________________________________________________________________________________________________________________________________________  WHEN TO CALL YOUR DOCTOR: 1. Fever over 101.0 2. Inability to urinate 3. Nausea and/or vomiting 4. Extreme swelling or bruising 5. Continued bleeding from rectum. 6. Increased pain, redness, or drainage from the incision 7. Constipation  The clinic staff is available to answer your questions during regular business hours.  Please dont hesitate to call and ask to speak to one of the nurses for clinical concerns.  If you have a medical emergency, go to the nearest emergency room or call 911.  A surgeon from [facility] is always on call at the hospital   [address], [place], [place], [place]  [address] ?  [address], [place], [place]   [address] [phone] ? [phone] ? FAX [phone] Web site: [email or web address]   Genital Warts Genital warts are a common STD (sexually transmitted disease). They  may appear as small bumps on the skin of the genital and anal areas. They sometimes become irritated and cause pain. Genital warts are easily passed to other people through sexual  contact. Many people do not know that they are infected. They may be infected for years without symptoms. However, even if they do not have symptoms, they can pass the infection to their sexual partners. Getting treatment is important because genital warts can lead to other problems. In females, the virus that causes genital warts may increase the risk for cervical cancer. What are the causes? This condition is caused by a virus that is called human papillomavirus (HPV). HPV is spread by having unprotected sex with an infected person. It can be spread through vaginal, anal, and oral sex. What increases the risk? You are more likely to develop this condition if:  You have unprotected sex.  You have multiple sexual partners.  You are sexually active before age 60.  You are a man who isnot circumcised.  You have a male sexual partner who is not circumcised.  You have a weakened body defense system (immune system) due to disease or medicine.  You smoke. What are the signs or symptoms? Symptoms of this condition include:  Small growths in the genital area or anal area. These warts often grow in clusters.  Itching and irritation in the genital area or anal area.  Bleeding from the warts.  Pain during sex. How is this diagnosed? This condition is diagnosed based on:  Your symptoms.  A physical exam. You may also have other tests, including:  Biopsy. A tissue sample is removed so it can be checked under a microscope.  Colposcopy. In females, a magnifying tool is used to examine the vagina and cervix. Certain solutions may be used to make the HPV cells change color so they can be seen more easily.  A Pap test in females.  Tests for other STDs. How is this treated? This condition may be treated with:  Medicines, such as: ? Solutions or creams applied to your skin (topical).  Procedures, such as: ? Freezing the warts with liquid nitrogen (cryotherapy). ? Burning the warts  with a laser or electric probe (electrocautery). ? Surgery to remove the warts. Follow these instructions at home: Medicines   Apply over-the-counter and prescription medicines only as told by your health care provider.  Do not treat genital warts with medicines that are used for treating hand warts.  Talk with your health care provider about using over-the-counter anti-itch creams. Instructions for women  Get screened regularly for cervical cancer. Women who have genital warts are at an increased risk for this cancer. This type of cancer is slow growing and can be cured if it is found early.  If you become pregnant, tell your health care provider that you have had an HPV infection. Your health care provider will monitor you closely during pregnancy to be sure that your baby is safe. General instructions  Do not touch or scratch the warts.  Do not have sex until your treatment has been completed.  Tell your current and past sexual partners about your condition because they may also need treatment.  After treatment, use condoms during sex to prevent future infections.  Keep all follow-up visits as told by your health care provider. This is important. How is this prevented? Talk with your health care provider about getting the HPV vaccine. The vaccine:  Can prevent some HPV infections and cancers.  Is recommended for  males and females who are 79-62 years old.  Is not recommended for pregnant women.  Will not work if you already have HPV. Contact a health care provider if you:  Have redness, swelling, or pain in the area of the treated skin.  Have a fever.  Feel generally ill.  Feel lumps in and around your genital or anal area.  Have bleeding in your genital or anal area.  Have pain during sex. Summary  Genital warts are a common STD (sexually transmitted disease). It may appear as small bumps on the genital and anal areas.  This condition is caused by a virus that  is called human papillomavirus (HPV). HPV is spread by having unprotected sex with an infected person. It can be spread through vaginal, anal, and oral sex.  Treatment is important because genital warts can lead to other problems. In females, the virus that causes genital warts may increase the risk for cervical cancer.  This condition may be treated with medicine that is applied to the skin, or procedures to remove the warts.  The HPV vaccine can prevent some HPV infections and cancers. It is recommended that the vaccine be given to males and females who are 42-31 years old. This information is not intended to replace advice given to you by your health care provider. Make sure you discuss any questions you have with your health care provider. Document Released: 05/14/2000 Document Revised: 06/21/2017 Document Reviewed: 06/21/2017 Elsevier Interactive Patient Education  2019 [employer].

## 2018-11-09 NOTE — Telephone Encounter (Signed)
Attempted to contact patient to schedule an appointment with our office. Also to see when he received his last refill on medication.  Unable to reach patient at this time, left vm requesting a call back from patient. [place]

## 2018-11-09 NOTE — ED Notes (Signed)
Surgery Provider at bedside.

## 2018-11-09 NOTE — Anesthesia Preprocedure Evaluation (Addendum)
Anesthesia Evaluation  Patient identified by MRN, date of birth, ID band Patient awake    Reviewed: Allergy & Precautions, NPO status , Patient's Chart, lab work & pertinent test results  Airway Mallampati: II  TM Distance: >3 FB Neck ROM: Full    Dental  (+) Chipped,    Pulmonary Current Smoker,    Pulmonary exam normal breath sounds clear to auscultation       Cardiovascular hypertension, Pt. on medications Normal cardiovascular exam Rhythm:Regular Rate:Normal     Neuro/Psych  Headaches, PSYCHIATRIC DISORDERS Depression    GI/Hepatic negative GI ROS, (+)     substance abuse  ,   Endo/Other  negative endocrine ROS  Renal/GU Renal InsufficiencyRenal disease     Musculoskeletal Lower back pain   Abdominal   Peds  Hematology  (+) HIV,   Anesthesia Other Findings rectal foreign body  Reproductive/Obstetrics                            Anesthesia Physical Anesthesia Plan  ASA: III  Anesthesia Plan: General   Post-op Pain Management:    Induction: Intravenous  PONV Risk Score and Plan: 1 and Ondansetron, Dexamethasone and Treatment may vary due to age or medical condition  Airway Management Planned: LMA  Additional Equipment:   Intra-op Plan:   Post-operative Plan: Extubation in OR  Informed Consent: I have reviewed the patients History and Physical, chart, labs and discussed the procedure including the risks, benefits and alternatives for the proposed anesthesia with the patient or authorized representative who has indicated his/her understanding and acceptance.     Dental advisory given  Plan Discussed with: CRNA and Surgeon  Anesthesia Plan Comments:       Anesthesia Quick Evaluation

## 2018-11-09 NOTE — ED Triage Notes (Signed)
Sometime this morning, foreign body in rectum.  Says he can feel it move, but has not come out of rectum, yet.  Has tried laxative in the last hour or so. No pain, but intermittently has a weird sensation.  Patient will need a work note.    Patient reports the object is a sex toy

## 2018-11-09 NOTE — ED Provider Notes (Signed)
[place]   [id number] 11/09/18 Arrival Time: 3419  ASSESSMENT & PLAN:  1. Rectal foreign body, initial encounter    Visible on KUB. Without peritoneal signs.  To ED for evaluation and removal. Stable upon discharge.  Reviewed expectations re: course of current medical issues. Questions answered. Outlined signs and symptoms indicating need for more acute intervention. Patient verbalized understanding. After Visit Summary given.   SUBJECTIVE: History from: patient. Jonathon Moore is a 33 y.o. male with HIV who reports a foreign body in his rectum; sex toy; inserted this morning; vibrating dildo; initially with continuous vibration that has now ceased. No reported abdominal or rectal pain. No rectal bleeding. Normal PO intake without n/v. Ambulatory without difficulty. Has tried taking OTC laxative; reports occasional watery leakage from rectum. Normal urination.  Past Surgical History:  Procedure Laterality Date  . BASCILIC VEIN TRANSPOSITION Left 01/12/2016   Procedure: LEFT ARM FIRST STAGE [place];  Surgeon: [employer], MD;  Location: [facility];  Service: Vascular;  Laterality: Left;  . IR GENERIC HISTORICAL  01/09/2016   IR US GUIDE VASC ACCESS RIGHT 01/09/2016 Marybelle Killings, MD [facility]  . IR GENERIC HISTORICAL  01/09/2016   IR FLUORO GUIDE CV LINE RIGHT 01/09/2016 Marybelle Killings, MD [facility]   ROS: As per HPI. All other systems negative.  OBJECTIVE:  Vitals:   11/09/18 1748 11/09/18 1751  BP: (!) 166/117 (!) 163/112  Pulse: 98   Resp: 18   Temp: 99.4 F (37.4 C)   TempSrc: Oral   SpO2: 98%     General appearance: alert, oriented, no acute distress Lungs: clear to auscultation bilaterally; unlabored respirations Heart: regular rate and rhythm Abdomen: soft; without distention; no tenderness; normal bowel sounds; without masses or organomegaly; without guarding or rebound tenderness Rectal: deferred as I am not going to  attempt removal here Back: without CVA tenderness; FROM at waist Extremities: without LE edema; symmetrical; without gross deformities Skin: warm and dry Neurologic: normal gait Psychological: alert and cooperative; normal mood and affect  No Known Allergies                                             Past Medical History:  Diagnosis Date  . Atypical chest pain 02/24/2016  . Bronchitis   . CKD (chronic kidney disease) stage 5, GFR less than 15 ml/min (HCC) 02/11/2016  . Hemorrhoids   . History of syphilis   . HIV (human immunodeficiency virus infection) ([place])   . HTN (hypertension) 11/09/2017  . Lower back pain 02/24/2016  . Poor compliance 02/09/2017   Social History   Socioeconomic History  . Marital status: Single    Spouse name: Not on file  . Number of children: Not on file  . Years of education: Not on file  . Highest education level: Not on file  Occupational History  . Not on file  Social Needs  . Financial resource strain: Not on file  . Food insecurity    Worry: Not on file    Inability: Not on file  . Transportation needs    Medical: Not on file    Non-medical: Not on file  Tobacco Use  . Smoking status: Current Some Day Smoker    Packs/day: 0.20  . Smokeless tobacco: Never Used  Substance and Sexual Activity  . Alcohol use: Yes  Comment: rarely  . Drug use: Yes    Types: Marijuana  . Sexual activity: Never    Partners: Male    Birth control/protection: Condom    Comment: given condoms  Lifestyle  . Physical activity    Days per week: Not on file    Minutes per session: Not on file  . Stress: Not on file  Relationships  . Social Herbalist on phone: Not on file    Gets together: Not on file    Attends religious service: Not on file    Active member of club or organization: Not on file    Attends meetings of clubs or organizations: Not on file    Relationship status: Not on file  . Intimate partner violence    Fear of current or ex  partner: Not on file    Emotionally abused: Not on file    Physically abused: Not on file    Forced sexual activity: Not on file  Other Topics Concern  . Not on file  Social History Narrative  . Not on file   Family History  Problem Relation Age of Onset  . Healthy Mother      Vanessa Kick, MD 11/09/18 [phone]

## 2018-11-09 NOTE — Transfer of Care (Signed)
Immediate Anesthesia Transfer of Care Note  Patient: Jonathon Moore  Procedure(s) Performed: EXAM UNDER ANESTHESIA  AND REMOVAL OF RECTAL FOREIGN BODY/ ANOSCOPY (N/A Rectum)  Patient Location: PACU  Anesthesia Type:General  Level of Consciousness: oriented, drowsy, patient cooperative and responds to stimulation  Airway & Oxygen Therapy: Patient Spontanous Breathing  Post-op Assessment: Report given to RN, Post -op Vital signs reviewed and stable and Patient moving all extremities X 4  Post vital signs: Reviewed and stable  Last Vitals:  Vitals Value Taken Time  BP 125/81 11/09/18 2320  Temp    Pulse 73 11/09/18 2323  Resp 16 11/09/18 2323  SpO2 92 % 11/09/18 2323  Vitals shown include unvalidated device data.  Last Pain:  Vitals:   11/09/18 2149  TempSrc: Oral  PainSc:          Complications: No apparent anesthesia complications

## 2018-11-10 LAB — T-HELPER CELLS (CD4) COUNT (NOT AT ARMC)
CD4 % Helper T Cell: 21 % — ABNORMAL LOW (ref 33–65)
CD4 T Cell Abs: 320 /uL — ABNORMAL LOW (ref 400–1790)

## 2018-11-10 NOTE — Anesthesia Postprocedure Evaluation (Signed)
Anesthesia Post Note  Patient: Jonathon Moore  Procedure(s) Performed: EXAM UNDER ANESTHESIA  AND REMOVAL OF RECTAL FOREIGN BODY/ ANOSCOPY (N/A Rectum)     Patient location during evaluation: PACU Anesthesia Type: General Level of consciousness: awake and alert Pain management: pain level controlled Vital Signs Assessment: post-procedure vital signs reviewed and stable Respiratory status: spontaneous breathing, nonlabored ventilation, respiratory function stable and patient connected to nasal cannula oxygen Cardiovascular status: blood pressure returned to baseline and stable Postop Assessment: no apparent nausea or vomiting Anesthetic complications: no    Last Vitals:  Vitals:   11/10/18 0004 11/10/18 0047  BP: (!) 145/95 (!) 137/101  Pulse: 83 71  Resp: 18 (!) 21  Temp: 36.7 C 36.8 C  SpO2: 98% 99%    Last Pain:  Vitals:   11/09/18 2350  TempSrc:   PainSc: 0-No pain                 Ryan P Ellender

## 2018-11-20 ENCOUNTER — Emergency Department (HOSPITAL_COMMUNITY)
Admission: EM | Admit: 2018-11-20 | Discharge: 2018-11-20 | Disposition: A | Discharge status: Home / Self Care | Payer: Self-pay | Attending: Emergency Medicine | Admitting: Emergency Medicine

## 2018-11-20 ENCOUNTER — Encounter (HOSPITAL_COMMUNITY): Payer: Self-pay | Admitting: Emergency Medicine

## 2018-11-20 ENCOUNTER — Other Ambulatory Visit: Payer: Self-pay

## 2018-11-20 ENCOUNTER — Emergency Department (HOSPITAL_COMMUNITY)
Admission: EM | Admit: 2018-11-20 | Discharge: 2018-11-21 | Disposition: A | Discharge status: Home / Self Care | Payer: Self-pay | Attending: Emergency Medicine | Admitting: Emergency Medicine

## 2018-11-20 ENCOUNTER — Emergency Department (HOSPITAL_COMMUNITY): Payer: Self-pay

## 2018-11-20 ENCOUNTER — Ambulatory Visit (HOSPITAL_COMMUNITY)
Admission: EM | Admit: 2018-11-20 | Discharge: 2018-11-20 | Disposition: A | Discharge status: Other Acute Inpatient Hospital | Payer: Self-pay

## 2018-11-20 ENCOUNTER — Ambulatory Visit: Payer: Self-pay | Admitting: Pharmacist

## 2018-11-20 DIAGNOSIS — N185 Chronic kidney disease, stage 5: Secondary | ICD-10-CM | POA: Insufficient documentation

## 2018-11-20 DIAGNOSIS — B20 Human immunodeficiency virus [HIV] disease: Secondary | ICD-10-CM | POA: Insufficient documentation

## 2018-11-20 DIAGNOSIS — R002 Palpitations: Secondary | ICD-10-CM | POA: Insufficient documentation

## 2018-11-20 DIAGNOSIS — R0789 Other chest pain: Secondary | ICD-10-CM | POA: Insufficient documentation

## 2018-11-20 DIAGNOSIS — I120 Hypertensive chronic kidney disease with stage 5 chronic kidney disease or end stage renal disease: Secondary | ICD-10-CM | POA: Insufficient documentation

## 2018-11-20 DIAGNOSIS — F17210 Nicotine dependence, cigarettes, uncomplicated: Secondary | ICD-10-CM | POA: Insufficient documentation

## 2018-11-20 DIAGNOSIS — Z79899 Other long term (current) drug therapy: Secondary | ICD-10-CM | POA: Insufficient documentation

## 2018-11-20 DIAGNOSIS — Z5321 Procedure and treatment not carried out due to patient leaving prior to being seen by health care provider: Secondary | ICD-10-CM | POA: Insufficient documentation

## 2018-11-20 LAB — BASIC METABOLIC PANEL
Anion gap: 10 (ref 5–15)
BUN: 17 mg/dL (ref 6–20)
CO2: 25 mmol/L (ref 22–32)
Calcium: 9.1 mg/dL (ref 8.9–10.3)
Chloride: 106 mmol/L (ref 98–111)
Creatinine, Ser: 2.44 mg/dL — ABNORMAL HIGH (ref 0.61–1.24)
GFR calc Af Amer: 39 mL/min — ABNORMAL LOW (ref >60)
GFR calc non Af Amer: 34 mL/min — ABNORMAL LOW (ref >60)
Glucose, Bld: 55 mg/dL — ABNORMAL LOW (ref 70–99)
Potassium: 2.9 mmol/L — ABNORMAL LOW (ref 3.5–5.1)
Sodium: 141 mmol/L (ref 135–145)

## 2018-11-20 LAB — CBC
HCT: 51.7 % (ref 39.0–52.0)
Hemoglobin: 17.5 g/dL — ABNORMAL HIGH (ref 13.0–17.0)
MCH: 32.3 pg (ref 26.0–34.0)
MCHC: 33.8 g/dL (ref 30.0–36.0)
MCV: 95.4 fL (ref 80.0–100.0)
Platelets: 416 10*3/uL — ABNORMAL HIGH (ref 150–400)
RBC: 5.42 MIL/uL (ref 4.22–5.81)
RDW: 13.2 % (ref 11.5–15.5)
WBC: 7.8 10*3/uL (ref 4.0–10.5)
nRBC: 0 % (ref 0.0–0.2)

## 2018-11-20 LAB — TROPONIN I: Troponin I: <0.03 ng/mL (ref <0.03)

## 2018-11-20 MED ORDER — SODIUM CHLORIDE 0.9% FLUSH: 3.0000 mL | Freq: Once | INTRAVENOUS | Status: DC

## 2018-11-20 MED ORDER — SODIUM CHLORIDE 0.9% FLUSH
3.0000 mL | Freq: Once | INTRAVENOUS | Status: AC
  Administered 2018-11-21: 3 mL via INTRAVENOUS

## 2018-11-20 MED ORDER — SODIUM CHLORIDE 0.9 % IV BOLUS
500.0000 mL | Freq: Once | INTRAVENOUS | Status: AC
  Administered 2018-11-21: 500 mL via INTRAVENOUS

## 2018-11-20 MED ORDER — ACETAMINOPHEN 325 MG PO TABS
650.0000 mg | ORAL_TABLET | Freq: Once | ORAL | Status: AC
  Administered 2018-11-21: 650 mg via ORAL
  Filled 2018-11-20: qty 2

## 2018-11-20 NOTE — ED Notes (Signed)
Pt states he is going to urgent care, encouraged pt to stay, pt declined.

## 2018-11-20 NOTE — ED Triage Notes (Signed)
Pt to return to ED for eval; pt ambulated out of [facility]

## 2018-11-20 NOTE — ED Triage Notes (Signed)
Pt reports that at approx 1:30 pm today he was outside and felt like his "heart was beating really fast" states that since then his left arm feels "tight" and that he has a pressure under his left chest.

## 2018-11-20 NOTE — ED Triage Notes (Signed)
Patient reports feeling like his heart is racing, symptoms began a few mins after smoking marijuana which he does on a regular basis. Pt denies any other substance use. Pt diaphoretic and very anxious in triage.

## 2018-11-20 NOTE — ED Provider Notes (Signed)
[facility] EMERGENCY DEPARTMENT Provider Note   CSN: [id number] Arrival date & time: 11/20/18  1540     History   Chief Complaint Chief Complaint  Patient presents with  . Palpitations    HPI Jonathon Moore is a 33 y.o. male with a hx of tobacco abuse, HTN, CKD, syphilis, & HIV who presents to the ED with complaints of palpitations which began just PTA and are resolved at present. Patient states that this afternoon he was @ rest in a vehicle smoking marijuana with friends feeling well when he developed palpitations described as sensation of heart racing associated with lightheadedness, chest tightness to L chest that radiated to L shoulder, & anxiety. He states sxs seemed to be getting better, he tried to calm himself, then he walked up to his bedroom to get changed and while getting dressed sxs seem to get worse prompting ER visit. He notes that while sitting in the waiting room all sxs have seemed to resolve with the exception of some mild residual L chest/shoulder tightness that is a 2-3/10 in severity. No specific alleviating/aggravating factors. No hx of similar. Denies dyspnea, fever, chills, cough, syncope, leg pain/swelling, hemoptysis, recent surgery/trauma, hormone use, personal hx of cancer, or hx of DVT/PE. He did have travel w/ 5-6 hour bus ride from [place] within past few months. He has had poor PO intake today did not eat or drink very much which he feels may have been the problem. Not consistently compliant w/ his BP meds.   Per chart review: last CD4 T cell abs: 320, % helper T cells 21.      HPI  Past Medical History:  Diagnosis Date  . Atypical chest pain 02/24/2016  . Bronchitis   . CKD (chronic kidney disease) stage 5, GFR less than 15 ml/min (HCC) 02/11/2016  . Hemorrhoids   . History of syphilis   . HIV (human immunodeficiency virus infection) ([place])   . HTN (hypertension) 11/09/2017  . Lower back pain 02/24/2016  . Poor compliance  02/09/2017    Patient Active Problem List   Diagnosis Date Noted  . HTN (hypertension) 11/09/2017  . Herpes infection 07/13/2017  . Poor compliance 02/09/2017  . Atypical chest pain 02/24/2016  . Lower back pain 02/24/2016  . CKD (chronic kidney disease) stage 5, GFR less than 15 ml/min (HCC) 02/11/2016  . Cough   . Headache   . History of syphilis   . AKI (acute kidney injury) ([place])   . AIDS (acquired immune deficiency syndrome) ([place])   . 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 Depression   . Normocytic anemia 01/04/2016  . Poor dentition 01/04/2016  . Acute renal failure ([place]) 01/03/2016  . Metabolic acidosis 28/41/3244  . Diarrhea 01/03/2016  . DOE (dyspnea on exertion)     Past Surgical History:  Procedure Laterality Date  . BASCILIC VEIN TRANSPOSITION Left 01/12/2016   Procedure: LEFT ARM FIRST STAGE [place];  Surgeon: Conrad Green, MD;  Location: [facility];  Service: Vascular;  Laterality: Left;  . IR GENERIC HISTORICAL  01/09/2016   IR US GUIDE VASC ACCESS RIGHT 01/09/2016 Marybelle Killings, MD [facility]  . IR GENERIC HISTORICAL  01/09/2016   IR FLUORO GUIDE CV LINE RIGHT 01/09/2016 Marybelle Killings, MD [facility]        Home Medications    Prior to Admission medications   Medication Sig Start Date End Date Taking? Authorizing Provider  Acetaminophen (TYLENOL EXTRA STRENGTH PO) Take by mouth.  [provider]  amLODipine (NORVASC) 5 MG tablet Take 1 tablet (5 mg total) by mouth at bedtime. 11/09/17   Truman Hayward, MD  bictegravir-emtricitabine-tenofovir AF (BIKTARVY) 50-200-25 MG TABS tablet Take 1 tablet by mouth daily. 04/18/18   Dennis Callas, NP  darunavir-cobicistat (PREZCOBIX) 800-150 MG tablet Take 1 tablet by mouth daily with breakfast. 04/18/18   Nelson Callas, NP  triamcinolone cream (KENALOG) 0.1 % Apply 1 application topically 2 (two) times daily. 11/09/17   Truman Hayward, MD  valACYclovir (VALTREX) 1000  MG tablet Take 1 tablet (1,000 mg total) by mouth 2 (two) times daily. 08/17/17   Kuppelweiser, Cassie L, RPH-CPP    Family History Family History  Problem Relation Age of Onset  . Healthy Mother     Social History Social History   Tobacco Use  . Smoking status: Current Some Day Smoker    Packs/day: 0.20  . Smokeless tobacco: Never Used  Substance Use Topics  . Alcohol use: Yes    Comment: rarely  . Drug use: Yes    Types: Marijuana     Allergies   Patient has no known allergies.   Review of Systems Review of Systems  Constitutional: Negative for chills, diaphoresis and fever.  Respiratory: Positive for chest tightness. Negative for cough and shortness of breath.   Cardiovascular: Positive for palpitations. Negative for leg swelling.  Gastrointestinal: Negative for abdominal pain, nausea and vomiting.  Neurological: Positive for light-headedness. Negative for dizziness, syncope, weakness and numbness.  All other systems reviewed and are negative.    Physical Exam Updated Vital Signs BP (!) 145/104 (BP Location: Left Arm)   Pulse 66   Temp 98.5 F (36.9 C) (Oral)   Resp 18   SpO2 99%   Physical Exam Vitals signs and nursing note reviewed.  Constitutional:      General: He is not in acute distress.    Appearance: He is well-developed. He is not toxic-appearing.  HENT:     Head: Normocephalic and atraumatic.  Eyes:     General:        Right eye: No discharge.        Left eye: No discharge.     Conjunctiva/sclera: Conjunctivae normal.  Neck:     Musculoskeletal: Neck supple.  Cardiovascular:     Rate and Rhythm: Normal rate and regular rhythm.     Pulses:          Radial pulses are 2+ on the right side and 2+ on the left side.       Posterior tibial pulses are 2+ on the right side and 2+ on the left side.     Heart sounds: No murmur. No friction rub. No gallop.   Pulmonary:     Effort: Pulmonary effort is normal. No respiratory distress.     Breath  sounds: Normal breath sounds. No wheezing, rhonchi or rales.  Abdominal:     General: There is no distension.     Palpations: Abdomen is soft.     Tenderness: There is no abdominal tenderness.  Musculoskeletal:        General: No tenderness.     Right lower leg: No edema.     Left lower leg: No edema.  Skin:    General: Skin is warm and dry.     Findings: No rash.  Neurological:     General: No focal deficit present.     Mental Status: He is alert.  Comments: Clear speech.  CN III through XII grossly intact.  Sensation grossly intact bilateral upper and lower extremities.  5 out of 5 symmetric grip strength.  5/5 strength with plantar dorsiflexion bilaterally.  Negative pronator drift.  Ambulatory.  Psychiatric:        Behavior: Behavior normal.    ED Treatments / Results  Labs (all labs ordered are listed, but only abnormal results are displayed) Labs Reviewed  BASIC METABOLIC PANEL - Abnormal; Notable for the following components:      Result Value   Glucose, Bld 56 (*)    Creatinine, Ser 2.01 (*)    Calcium 8.8 (*)    GFR calc non Af Amer 43 (*)    GFR calc Af Amer 50 (*)    All other components within normal limits  CBG MONITORING, ED - Abnormal; Notable for the following components:   Glucose-Capillary 107 (*)    All other components within normal limits  TROPONIN I  CBC  TROPONIN I   EKG   EKG Interpretation  Date/Time:  Monday November 20 2018 16:27:23 EDT Ventricular Rate:  63 PR Interval:  146 QRS Duration: 92 QT Interval:  414 QTC Calculation: 423 R Axis:   -67 Text Interpretation:  Normal sinus rhythm Left anterior fascicular block T wave abnormality Abnormal ECG Confirmed by Gerhard Munch [phone]) on 11/21/2018 3:16:40 AM   Radiology Dg Chest 2 View  Result Date: 11/20/2018 CLINICAL DATA:  Left-sided chest pain with palpitations. EXAM: CHEST - 2 VIEW COMPARISON:  Chest x-ray dated June 10, 2016. FINDINGS: The heart size and mediastinal contours are  within normal limits. Both lungs are clear. The visualized skeletal structures are unremarkable. IMPRESSION: No active cardiopulmonary disease. Electronically Signed   By: Obie Dredge M.D.   On: 11/20/2018 14:52    Procedures Procedures (including critical care time)  Medications Ordered in ED Medications  sodium chloride flush (NS) 0.9 % injection 3 mL (3 mLs Intravenous Given 11/21/18 0050)  sodium chloride 0.9 % bolus 500 mL (0 mLs Intravenous Stopped 11/21/18 0222)  acetaminophen (TYLENOL) tablet 650 mg (650 mg Oral Given 11/21/18 0038)    Initial Impression / Assessment and Plan / ED Course  I have reviewed the triage vital signs and the nursing notes.  Pertinent labs & imaging results that were available during my care of the patient were reviewed by me and considered in my medical decision making (see chart for details).    Patient presents to the emergency department with an episode of palpitations, chest tightness, anxiety, &  Lightheadedness that began while at rest smoking marijuana, overall sxs resolved remains w/ minimal chest tightness. Has had poor PO intake today. Patient nontoxic appearing, in no apparent distress, vitals without significant abnormality- BP elevated, noncompliant w/ antihypertensives, doubt HTN emergency. Fairly benign physical exam. DDX: ACS, pulmonary embolism, dissection, pneumothorax, effusion, infiltrate, arrhythmia, anemia, electrolyte derangement, MSK, GERD, anxiety, substance induced.  Work-up in the ER reviewed:  CBC: No anemia/leukocytosis.  BMP: kidney function similar to prior. No significant electrolyte derangement, mild hypocalcemia @ 8.8. Hypoglycemia @ 56 Troponin: delta negative.  EKG: No STEMI CXR: Negative, without infiltrate, effusion, pneumothorax, or fracture/dislocation.   Hypoglycemia @ 56, patient has had minimal PO intake today which I suspect is cause of this, given food/fluids w/ improvement to 107 on repeat CBG.   Low risk  heart score, EKG no STEMI delta troponin negative, doubt ACS. Patient is low risk wells, doubt pulmonary embolism. Pain is not a tearing sensation,  symmetric pulses, no widening of mediastinum on CXR, doubt dissection. Cardiac monitor reviewed, no notable arrhythmias or tachycardia. Patient has appeared hemodynamically stable throughout ER visit and appears safe for discharge with close PCP/cardiology follow up. I discussed results, treatment plan, need for PCP follow-up, and return precautions with the patient. Provided opportunity for questions, patient confirmed understanding and is in agreement with plan.     Final Clinical Impressions(s) / ED Diagnoses   Final diagnoses:  Palpitations  Chest tightness    ED Discharge Orders    None       Amaryllis Dyke, PA-C 11/21/18 0401    Carmin Muskrat, MD 11/21/18 [phone]

## 2018-11-21 LAB — CBC
HCT: 48.9 % (ref 39.0–52.0)
Hemoglobin: 16.5 g/dL (ref 13.0–17.0)
MCH: 32.3 pg (ref 26.0–34.0)
MCHC: 33.7 g/dL (ref 30.0–36.0)
MCV: 95.7 fL (ref 80.0–100.0)
Platelets: 341 10*3/uL (ref 150–400)
RBC: 5.11 MIL/uL (ref 4.22–5.81)
RDW: 13.2 % (ref 11.5–15.5)
WBC: 6.4 10*3/uL (ref 4.0–10.5)
nRBC: 0 % (ref 0.0–0.2)

## 2018-11-21 LAB — BASIC METABOLIC PANEL
Anion gap: 10 (ref 5–15)
BUN: 17 mg/dL (ref 6–20)
CO2: 24 mmol/L (ref 22–32)
Calcium: 8.8 mg/dL — ABNORMAL LOW (ref 8.9–10.3)
Chloride: 105 mmol/L (ref 98–111)
Creatinine, Ser: 2.01 mg/dL — ABNORMAL HIGH (ref 0.61–1.24)
GFR calc Af Amer: 50 mL/min — ABNORMAL LOW (ref >60)
GFR calc non Af Amer: 43 mL/min — ABNORMAL LOW (ref >60)
Glucose, Bld: 56 mg/dL — ABNORMAL LOW (ref 70–99)
Potassium: 3.9 mmol/L (ref 3.5–5.1)
Sodium: 139 mmol/L (ref 135–145)

## 2018-11-21 LAB — CBG MONITORING, ED: Glucose-Capillary: 107 mg/dL — ABNORMAL HIGH (ref 70–99)

## 2018-11-21 LAB — TROPONIN I: Troponin I: <0.03 ng/mL (ref <0.03)

## 2018-11-21 NOTE — Discharge Instructions (Signed)
You were seen in the emergency department today for chest pain. Your work-up in the emergency department has been overall reassuring. Your labs have been fairly normal and or similar to previous blood work you have had done. Your blood sugar was somewhat low and improved after having something to eat/drink. Your EKG and the enzyme we use to check your heart did not show an acute heart attack at this time. Your chest x-ray was normal.   We would like you to follow up closely with your primary care provider and/or the cardiologist provided in your discharge instructions within 1-3 days. Return to the ER immediately should you experience any new or worsening symptoms including but not limited to return of pain, worsened pain, vomiting, shortness of breath, dizziness, lightheadedness, passing out, or any other concerns that you may have.

## 2018-11-22 ENCOUNTER — Encounter (HOSPITAL_COMMUNITY): Payer: Self-pay | Admitting: General Surgery

## 2018-12-04 ENCOUNTER — Telehealth: Payer: Self-pay | Admitting: Infectious Disease

## 2018-12-04 NOTE — Telephone Encounter (Signed)
COVID-19 Pre-Screening Questions:12/04/18 ° ° °Do you currently have a fever (>100 °F), chills or unexplained body aches? NO  ° °Are you currently experiencing new cough, shortness of breath, sore throat, runny nose?NO °•  °Have you recently travelled outside the state of [place] in the last 14 days? NO °•  °Have you been in contact with someone that is currently pending confirmation of Covid19 testing or has been confirmed to have the Covid19 virus?  NO ° °**If the patient answers NO to ALL questions -  advise the patient to please call the clinic before coming to the office should any symptoms develop.  ° ° °

## 2018-12-05 ENCOUNTER — Other Ambulatory Visit: Payer: Self-pay

## 2018-12-05 ENCOUNTER — Ambulatory Visit (INDEPENDENT_AMBULATORY_CARE_PROVIDER_SITE_OTHER): Payer: Self-pay | Admitting: Pharmacist

## 2018-12-05 DIAGNOSIS — B20 Human immunodeficiency virus [HIV] disease: Secondary | ICD-10-CM

## 2018-12-05 DIAGNOSIS — N185 Chronic kidney disease, stage 5: Secondary | ICD-10-CM

## 2018-12-05 DIAGNOSIS — Z9119 Patient's noncompliance with other medical treatment and regimen: Secondary | ICD-10-CM

## 2018-12-05 DIAGNOSIS — Z113 Encounter for screening for infections with a predominantly sexual mode of transmission: Secondary | ICD-10-CM

## 2018-12-05 DIAGNOSIS — N08 Glomerular disorders in diseases classified elsewhere: Secondary | ICD-10-CM

## 2018-12-05 DIAGNOSIS — Z91199 Patient's noncompliance with other medical treatment and regimen due to unspecified reason: Secondary | ICD-10-CM

## 2018-12-05 MED ORDER — TRIAMCINOLONE ACETONIDE 0.1 % EX CREA: 1.0000 "application " | TOPICAL_CREAM | Freq: Two times a day (BID) | CUTANEOUS | 4 refills | Status: AC

## 2018-12-05 MED ORDER — PREZCOBIX 800-150 MG PO TABS: 1.0000 | ORAL_TABLET | Freq: Every day | ORAL | 3 refills | Status: DC

## 2018-12-05 MED ORDER — BIKTARVY 50-200-25 MG PO TABS: 1.0000 | ORAL_TABLET | Freq: Every day | ORAL | 3 refills | Status: DC

## 2018-12-05 NOTE — Progress Notes (Signed)
HPI: Jonathon Moore is a 33 y.o. male who presents to the [facility] for HIV follow-up.  Patient Active Problem List   Diagnosis Date Noted  . HTN (hypertension) 11/09/2017  . Herpes infection 07/13/2017  . Poor compliance 02/09/2017  . Atypical chest pain 02/24/2016  . Lower back pain 02/24/2016  . CKD (chronic kidney disease) stage 5, GFR less than 15 ml/min (HCC) 02/11/2016  . Cough   . Headache   . History of syphilis   . AKI (acute kidney injury) ([place])   . AIDS (acquired immune deficiency syndrome) ([place])   . 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 Depression   . Normocytic anemia 01/04/2016  . Poor dentition 01/04/2016  . Acute renal failure ([place]) 01/03/2016  . Metabolic acidosis 80/99/8338  . Diarrhea 01/03/2016  . DOE (dyspnea on exertion)     Patient's Medications  New Prescriptions   No medications on file  Previous Medications   ACETAMINOPHEN (TYLENOL EXTRA STRENGTH PO)    Take by mouth.   AMLODIPINE (NORVASC) 5 MG TABLET    Take 1 tablet (5 mg total) by mouth at bedtime.   VALACYCLOVIR (VALTREX) 1000 MG TABLET    Take 1 tablet (1,000 mg total) by mouth 2 (two) times daily.  Modified Medications   Modified Medication Previous Medication   BICTEGRAVIR-EMTRICITABINE-TENOFOVIR AF (BIKTARVY) 50-200-25 MG TABS TABLET bictegravir-emtricitabine-tenofovir AF (BIKTARVY) 50-200-25 MG TABS tablet      Take 1 tablet by mouth daily.    Take 1 tablet by mouth daily.   DARUNAVIR-COBICISTAT (PREZCOBIX) 800-150 MG TABLET darunavir-cobicistat (PREZCOBIX) 800-150 MG tablet      Take 1 tablet by mouth daily with breakfast.    Take 1 tablet by mouth daily with breakfast.   TRIAMCINOLONE CREAM (KENALOG) 0.1 % triamcinolone cream (KENALOG) 0.1 %      Apply 1 application topically 2 (two) times daily.    Apply 1 application topically 2 (two) times daily.  Discontinued Medications   No medications on file    Allergies: No Known Allergies  Past Medical  History: Past Medical History:  Diagnosis Date  . Atypical chest pain 02/24/2016  . Bronchitis   . CKD (chronic kidney disease) stage 5, GFR less than 15 ml/min (HCC) 02/11/2016  . Hemorrhoids   . History of syphilis   . HIV (human immunodeficiency virus infection) ([place])   . HTN (hypertension) 11/09/2017  . Lower back pain 02/24/2016  . Poor compliance 02/09/2017    Social History: Social History   Socioeconomic History  . Marital status: Single    Spouse name: Not on file  . Number of children: Not on file  . Years of education: Not on file  . Highest education level: Not on file  Occupational History  . Not on file  Social Needs  . Financial resource strain: Not on file  . Food insecurity    Worry: Not on file    Inability: Not on file  . Transportation needs    Medical: Not on file    Non-medical: Not on file  Tobacco Use  . Smoking status: Current Some Day Smoker    Packs/day: 0.20  . Smokeless tobacco: Never Used  Substance and Sexual Activity  . Alcohol use: Yes    Comment: rarely  . Drug use: Yes    Types: Marijuana  . Sexual activity: Never    Partners: Male    Birth control/protection: Condom    Comment: given condoms  Lifestyle  . Physical  activity    Days per week: Not on file    Minutes per session: Not on file  . Stress: Not on file  Relationships  . Social Herbalist on phone: Not on file    Gets together: Not on file    Attends religious service: Not on file    Active member of club or organization: Not on file    Attends meetings of clubs or organizations: Not on file    Relationship status: Not on file  Other Topics Concern  . Not on file  Social History Narrative  . Not on file    Labs: Lab Results  Component Value Date   HIV1RNAQUANT 681 (H) 08/15/2018   HIV1RNAQUANT 116 (H) 03/23/2018   HIV1RNAQUANT 25 (H) 11/09/2017   CD4TABS 320 (L) 11/09/2018   CD4TABS 210 (L) 08/15/2018   CD4TABS 260 (L) 03/23/2018    RPR and STI  Lab Results  Component Value Date   LABRPR REACTIVE (A) 08/15/2018   LABRPR REACTIVE (A) 03/23/2018   LABRPR REACTIVE (A) 11/09/2017   LABRPR REACTIVE (A) 08/25/2016   LABRPR REACTIVE (A) 06/08/2016   RPRTITER 1:64 (H) 08/15/2018   RPRTITER 1:128 (H) 03/23/2018   RPRTITER 1:256 (H) 11/09/2017   RPRTITER 1:2 08/25/2016   RPRTITER 1:64 (A) 06/08/2016    STI Results GC CT  08/15/2018 Negative Negative  03/23/2018 Negative Negative  11/09/2017 **POSITIVE**(A) **POSITIVE**(A)  11/09/2017 Negative Negative  08/25/2016 Negative Negative  08/25/2016 Negative Negative  02/11/2016 Negative Negative    Hepatitis B Lab Results  Component Value Date   HEPBSAG Negative 01/03/2016   HEPBCAB Negative 01/03/2016   Hepatitis C No results found for: HEPCAB, HCVRNAPCRQN Hepatitis A Lab Results  Component Value Date   HAV Positive (A) 01/04/2016   Lipids: Lab Results  Component Value Date   CHOL 198 12/28/2016   TRIG 140 12/28/2016   HDL 40 (L) 12/28/2016   CHOLHDL 5.0 (H) 12/28/2016   VLDL 28 12/28/2016   LDLCALC 130 (H) 12/28/2016    Current HIV Regimen: Biktarvy + Prezcobix  Assessment: Jonathon Moore is here today to follow-up for his HIV infection after several no shows with the providers. He has a long history of non-adherence with his ART regimen and is off and on medications quite frequently. He has no showed Stephanie and Dr. Tommy Medal 8 times since he was last seen on 03/23/18.   He comes in today and states that he is not taking his BIktarvy and Prezcobix, because he feels it "isn't working for him anymore".  I asked why he thought that and he said his kidney function was up when it was last checked in the ED in June.  He states that he stopped the HIV medications awhile back ("several weeks") because of this and he wanted a new regimen. I explained that his kidney function was likely up because of his uncontrolled hypertension as he stopped his BP medications several years ago and his  BP was 145/104 when checked in the ED on 6/22. I also explained that being off of his ART could cause his SCr to go up as well because he has a history of HIVAN. He was on the verge of being placed on dialysis back in 2017 but luckily his kidney function recovered once ART was started.  I have had this conversation with Chao many, many, many times and he doesn't seem to really understand the need to take medications consistently.  I told him that I  would not change his regimen as I believe that this is the best regimen for him as his adherence is not good at all and he will likely develop resistance if on a single tablet regimen. He did tell me that he takes both when he does take it and never one without the other.  His prescriptions were sent to [facility] back in November with only 3 refills, so I highly doubt he has only been off his ART for "a few weeks".   Again, I went over the importance of consistently taking his medications and restarting his BP medication or he is going to end up with a ton of resistance and possibly trending toward needing dialysis.  I've honestly had this conversation with Orson at least 15 times, and I'm not sure what else I can say to portray the importance.  He will restart his ART today and states that he will continue taking them.  He also has a history of neurosyphilis and was previously treated with IV PCN. His RPR sky rocketed back in June of 2019 (1:256) after previously being 1:2 in March of 2018.  He was initially supposed to have a repeat LP but this was never set up.  He then saw Colletta [place] back on 03/23/18 and was set up to get 3 weekly Bicillin shots but no showed all three appointments but randomly showed up in March 2020 and received one out of three shots and then no showed the rest. His RPR has been trending down 1:256 >> 1:128 >> 1:64 on 08/15/2018.  Will recheck RPR today.  I will check all other labs today and schedule him a follow-up in 4 weeks with  Dr. Tommy Medal. He met with Juliann Pulse to renew his HMAP but knows that he must bring in pay stubs to finish the application.  I had him put the appointment in his phone calendar with a note to remember to bring his pay stubs with him. He doesn't have a good excuse for missing all of his appointments as he walks here from his house. He just states that he just doesn't come.  Plan: - Restart Biktarvy + Prezcobix with food daily (refills sent to [facility]) - HIV RNA with reflex, CD4, RPR, CMET, CBC w diff, urine cytology, rectal cytology today - Renew HMAP - F/u with Dr. Tommy Medal 8/27 at [facility]. , PharmD, BCIDP, AAHIVP, [facility] 12/05/2018, 2:04 PM

## 2018-12-06 LAB — URINE CYTOLOGY ANCILLARY ONLY
Chlamydia: NEGATIVE
Neisseria Gonorrhea: NEGATIVE

## 2018-12-06 LAB — T-HELPER CELL (CD4) - (RCID CLINIC ONLY)
CD4 % Helper T Cell: 20 % — ABNORMAL LOW (ref 33–65)
CD4 T Cell Abs: 335 /uL — ABNORMAL LOW (ref 400–1790)

## 2018-12-11 ENCOUNTER — Telehealth: Payer: Self-pay | Admitting: Pharmacist

## 2018-12-11 NOTE — Telephone Encounter (Signed)
Dr. Tommy Medal - I am sure you remember this guy but he was previously treated for neurosyphilis back in 2017. Also had significant HIVAN and was almost on full-time dialysis.  His RPR had come down nicely to 1:2 back in March 2018 after his neurosyphilis treatment. He saw you on 11/09/17 and his RPR was back up to 1:256. You recommended a LP.  Patient was called on 11/15/17 but never returned our calls for appointments.  He saw Stephanie on 03/23/18 and RPR was still elevated at 1:128.  Sharyn Lull scheduled him for 3 weekly Bicillin shots but he no showed all of them. He came in on 08/15/2018 for labs and we gave him one shot of Bicillin but he no showed the rest. His RPR at this time was coming down and was 1:64.  I saw him last week and his RPR is back up to 1:128. He didn't specify any neurologic symptoms. As you know, his adherence is terrible.  He was off medications again because he felt they "weren't working" because his SCr was going back up. He was never given any doxycycline Rx or anything. Highly doubt he would take it anyway. What should the next steps be for him? If he is willing to do anything!

## 2018-12-11 NOTE — Telephone Encounter (Signed)
Cumulative HIV Genotype Data  Genotype Dates: 12/28/2016  RT Mutations  K70E  PI Mutations   Integrase Mutations    Interpretation of Genotype Data per [employer] HIV Drug Resistance Database:  Nucleoside RTIs  Abacavir - low level resistance Zidovudine - susceptible Emtricitabine - potential low level resistance Lamivudine - potential low level resistance Tenofovir - low level resistance   Non-Nucleoside RTIs  Doravirine - susceptible Efavirenz - susceptible Etravirine - susceptible Nevirapine - susceptible Rilpivirine - susceptible   Protease Inhibitors  Atazanavir - susceptible Darunavir - susceptible Lopinavir - susceptible   Integrase Inhibitors  Bictegravir - susceptible Dolutegravir - susceptible Elvitegravir - susceptible Raltegravir - susceptible   Genotype is currently pending to assess further development of resistance.  Cassie L. Kuppelweiser, PharmD, BCIDP, AAHIVP, [facility] 12/11/2018, 2:03 PM

## 2018-12-12 NOTE — Telephone Encounter (Signed)
I think given how hard it is to get a hold of him I would try to give him doxycycline for a month maybe at some point we can talk him into a lumbar puncture or presumptive 2 weeks of IV penicillin but I doubt that will happen now

## 2018-12-13 LAB — CBC WITH DIFFERENTIAL/PLATELET
Absolute Monocytes: 707 cells/uL (ref 200–950)
Basophils Absolute: 68 cells/uL (ref 0–200)
Basophils Relative: 1.2 %
Eosinophils Absolute: 479 cells/uL (ref 15–500)
Eosinophils Relative: 8.4 %
HCT: 46.7 % (ref 38.5–50.0)
Hemoglobin: 16.5 g/dL (ref 13.2–17.1)
Lymphs Abs: 1932 cells/uL (ref 850–3900)
MCH: 32.9 pg (ref 27.0–33.0)
MCHC: 35.3 g/dL (ref 32.0–36.0)
MCV: 93 fL (ref 80.0–100.0)
MPV: 10.4 fL (ref 7.5–12.5)
Monocytes Relative: 12.4 %
Neutro Abs: 2514 cells/uL (ref 1500–7800)
Neutrophils Relative %: 44.1 %
Platelets: 302 10*3/uL (ref 140–400)
RBC: 5.02 10*6/uL (ref 4.20–5.80)
RDW: 13.2 % (ref 11.0–15.0)
Total Lymphocyte: 33.9 %
WBC: 5.7 10*3/uL (ref 3.8–10.8)

## 2018-12-13 LAB — COMPREHENSIVE METABOLIC PANEL
AG Ratio: 1 (calc) (ref 1.0–2.5)
ALT: 20 U/L (ref 9–46)
AST: 18 U/L (ref 10–40)
Albumin: 3.6 g/dL (ref 3.6–5.1)
Alkaline phosphatase (APISO): 71 U/L (ref 36–130)
BUN/Creatinine Ratio: 11 (calc) (ref 6–22)
BUN: 22 mg/dL (ref 7–25)
CO2: 27 mmol/L (ref 20–32)
Calcium: 9.2 mg/dL (ref 8.6–10.3)
Chloride: 105 mmol/L (ref 98–110)
Creat: 1.94 mg/dL — ABNORMAL HIGH (ref 0.60–1.35)
Globulin: 3.5 g/dL (calc) (ref 1.9–3.7)
Glucose, Bld: 71 mg/dL (ref 65–99)
Potassium: 4.4 mmol/L (ref 3.5–5.3)
Sodium: 141 mmol/L (ref 135–146)
Total Bilirubin: 0.6 mg/dL (ref 0.2–1.2)
Total Protein: 7.1 g/dL (ref 6.1–8.1)

## 2018-12-13 LAB — RPR: RPR Ser Ql: REACTIVE — AB

## 2018-12-13 LAB — FLUORESCENT TREPONEMAL AB(FTA)-IGG-BLD: Fluorescent Treponemal ABS: REACTIVE — AB

## 2018-12-13 LAB — RPR TITER: RPR Titer: 1:128 {titer} — ABNORMAL HIGH

## 2018-12-13 LAB — HIV RNA, RTPCR W/R GT (RTI, PI,INT)

## 2018-12-13 NOTE — Telephone Encounter (Signed)
OK will do.  I called him to discuss and of course there was no answer. I left a VM. Shouldn't the health department be tracking him down for this as well?

## 2018-12-13 NOTE — Telephone Encounter (Signed)
Yes they should be

## 2018-12-14 ENCOUNTER — Telehealth: Payer: Self-pay | Admitting: *Deleted

## 2018-12-14 NOTE — Telephone Encounter (Signed)
Received message that patient recent viral load sample was not tested due to they are unable to locate sample (its lost). He will need to come to office for a new draw so we can resubmit. Had to leave a message for him to call the office as he did not answer the line.

## 2019-01-23 ENCOUNTER — Emergency Department (HOSPITAL_COMMUNITY)
Admission: EM | Admit: 2019-01-23 | Discharge: 2019-01-24 | Disposition: A | Discharge status: Home / Self Care | Payer: Self-pay | Attending: Emergency Medicine | Admitting: Emergency Medicine

## 2019-01-23 ENCOUNTER — Ambulatory Visit (HOSPITAL_COMMUNITY)
Admission: EM | Admit: 2019-01-23 | Discharge: 2019-01-23 | Disposition: A | Discharge status: Home / Self Care | Payer: Self-pay

## 2019-01-23 ENCOUNTER — Encounter (HOSPITAL_COMMUNITY): Payer: Self-pay | Admitting: Emergency Medicine

## 2019-01-23 ENCOUNTER — Other Ambulatory Visit: Payer: Self-pay

## 2019-01-23 DIAGNOSIS — N185 Chronic kidney disease, stage 5: Secondary | ICD-10-CM | POA: Insufficient documentation

## 2019-01-23 DIAGNOSIS — F1290 Cannabis use, unspecified, uncomplicated: Secondary | ICD-10-CM | POA: Insufficient documentation

## 2019-01-23 DIAGNOSIS — M71022 Abscess of bursa, left elbow: Secondary | ICD-10-CM | POA: Insufficient documentation

## 2019-01-23 DIAGNOSIS — L03114 Cellulitis of left upper limb: Secondary | ICD-10-CM | POA: Insufficient documentation

## 2019-01-23 DIAGNOSIS — Z79899 Other long term (current) drug therapy: Secondary | ICD-10-CM | POA: Insufficient documentation

## 2019-01-23 DIAGNOSIS — F17210 Nicotine dependence, cigarettes, uncomplicated: Secondary | ICD-10-CM | POA: Insufficient documentation

## 2019-01-23 DIAGNOSIS — I120 Hypertensive chronic kidney disease with stage 5 chronic kidney disease or end stage renal disease: Secondary | ICD-10-CM | POA: Insufficient documentation

## 2019-01-23 DIAGNOSIS — B20 Human immunodeficiency virus [HIV] disease: Secondary | ICD-10-CM | POA: Insufficient documentation

## 2019-01-23 LAB — COMPREHENSIVE METABOLIC PANEL
ALT: 16 U/L (ref 0–44)
AST: 13 U/L — ABNORMAL LOW (ref 15–41)
Albumin: 3.2 g/dL — ABNORMAL LOW (ref 3.5–5.0)
Alkaline Phosphatase: 81 U/L (ref 38–126)
Anion gap: 11 (ref 5–15)
BUN: 9 mg/dL (ref 6–20)
CO2: 24 mmol/L (ref 22–32)
Calcium: 8.8 mg/dL — ABNORMAL LOW (ref 8.9–10.3)
Chloride: 104 mmol/L (ref 98–111)
Creatinine, Ser: 1.98 mg/dL — ABNORMAL HIGH (ref 0.61–1.24)
GFR calc Af Amer: 51 mL/min — ABNORMAL LOW (ref >60)
GFR calc non Af Amer: 44 mL/min — ABNORMAL LOW (ref >60)
Glucose, Bld: 85 mg/dL (ref 70–99)
Potassium: 4.4 mmol/L (ref 3.5–5.1)
Sodium: 139 mmol/L (ref 135–145)
Total Bilirubin: 0.9 mg/dL (ref 0.3–1.2)
Total Protein: 6.5 g/dL (ref 6.5–8.1)

## 2019-01-23 LAB — CBC WITH DIFFERENTIAL/PLATELET
Abs Immature Granulocytes: 0.04 10*3/uL (ref 0.00–0.07)
Basophils Absolute: 0.1 10*3/uL (ref 0.0–0.1)
Basophils Relative: 1 %
Eosinophils Absolute: 0.2 10*3/uL (ref 0.0–0.5)
Eosinophils Relative: 2 %
HCT: 50.7 % (ref 39.0–52.0)
Hemoglobin: 17.4 g/dL — ABNORMAL HIGH (ref 13.0–17.0)
Immature Granulocytes: 0 %
Lymphocytes Relative: 18 %
Lymphs Abs: 1.7 10*3/uL (ref 0.7–4.0)
MCH: 32.8 pg (ref 26.0–34.0)
MCHC: 34.3 g/dL (ref 30.0–36.0)
MCV: 95.7 fL (ref 80.0–100.0)
Monocytes Absolute: 1 10*3/uL (ref 0.1–1.0)
Monocytes Relative: 11 %
Neutro Abs: 6.5 10*3/uL (ref 1.7–7.7)
Neutrophils Relative %: 68 %
Platelets: 352 10*3/uL (ref 150–400)
RBC: 5.3 MIL/uL (ref 4.22–5.81)
RDW: 12.4 % (ref 11.5–15.5)
WBC: 9.5 10*3/uL (ref 4.0–10.5)
nRBC: 0 % (ref 0.0–0.2)

## 2019-01-23 MED ORDER — SODIUM CHLORIDE 0.9 % IV BOLUS
1000.0000 mL | Freq: Once | INTRAVENOUS | Status: AC
  Administered 2019-01-23: 23:00:00 1000 mL via INTRAVENOUS

## 2019-01-23 MED ORDER — VANCOMYCIN HCL 10 G IV SOLR
2000.0000 mg | Freq: Once | INTRAVENOUS | Status: AC
  Administered 2019-01-24: 2000 mg via INTRAVENOUS
  Filled 2019-01-23: qty 2000

## 2019-01-23 MED ORDER — VANCOMYCIN HCL IN DEXTROSE 1-5 GM/200ML-% IV SOLN: 1000.0000 mg | Freq: Once | INTRAVENOUS | Status: DC

## 2019-01-23 MED ORDER — VANCOMYCIN HCL 10 G IV SOLR: 1750.0000 mg | INTRAVENOUS | Status: DC

## 2019-01-23 MED ORDER — LIDOCAINE-EPINEPHRINE (PF) 2 %-1:200000 IJ SOLN
20.0000 mL | Freq: Once | INTRAMUSCULAR | Status: AC
  Administered 2019-01-23: 20 mL via INTRADERMAL

## 2019-01-23 MED ORDER — VANCOMYCIN HCL IN DEXTROSE 1-5 GM/200ML-% IV SOLN
1000.0000 mg | Freq: Once | INTRAVENOUS | Status: DC
  Filled 2019-01-23: qty 200

## 2019-01-23 MED ORDER — LIDOCAINE-EPINEPHRINE (PF) 2 %-1:200000 IJ SOLN
INTRAMUSCULAR | Status: AC
  Administered 2019-01-23: 20 mL via INTRADERMAL
  Filled 2019-01-23: qty 20

## 2019-01-23 NOTE — ED Notes (Signed)
Patient is being [facility] and sent to the Emergency Department via wheelchair by staff. Rowe Pavy, NP, patient is stable but in need of higher level of care due to cellulitis of extremity and need for iv antibiotics. Patient is aware and verbalizes understanding of plan of care.  Vitals:   01/23/19 1511  BP: (!) 147/98  Pulse: 85  Resp: 16  Temp: 98.4 F (36.9 C)  SpO2: 99%

## 2019-01-23 NOTE — ED Triage Notes (Signed)
Pt noticed a bump on his L elbow on Saturday, states on Monday his arm started to swell up. Large amount of swelling to L arm. Denies injury.

## 2019-01-23 NOTE — ED Provider Notes (Signed)
[facility] EMERGENCY DEPARTMENT Provider Note   CSN: [id number] Arrival date & time: 01/23/19  1546     History   Chief Complaint Chief Complaint  Patient presents with  . Wound Infection    HPI Jonathon Moore is a 33 y.o. male.  With a past medical history of HIV, CKD, history of syphilis, hypertension, poor medication compliance.  Patient's last viral load was 681 and last CD4 count was 335. patient states that he does take his medicine daily.  Patient developed what he thought was a pimple on his elbow 4 days ago.  He has had progressively worsening pain and swelling and now his whole left arm is swollen from just below the deltoid all the way down to his hand.  He denies fevers or chills.  He says that his pain is about a 3 when he keeps his arm still but is worse whenever he tries to extend the elbow.     HPI  Past Medical History:  Diagnosis Date  . Atypical chest pain 02/24/2016  . Bronchitis   . CKD (chronic kidney disease) stage 5, GFR less than 15 ml/min (HCC) 02/11/2016  . Hemorrhoids   . History of syphilis   . HIV (human immunodeficiency virus infection) ([place])   . HTN (hypertension) 11/09/2017  . Lower back pain 02/24/2016  . Poor compliance 02/09/2017    Patient Active Problem List   Diagnosis Date Noted  . HTN (hypertension) 11/09/2017  . Herpes infection 07/13/2017  . Poor compliance 02/09/2017  . Atypical chest pain 02/24/2016  . Lower back pain 02/24/2016  . CKD (chronic kidney disease) stage 5, GFR less than 15 ml/min (HCC) 02/11/2016  . Cough   . Headache   . History of syphilis   . AKI (acute kidney injury) ([place])   . AIDS (acquired immune deficiency syndrome) ([place])   . 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 Depression   . Normocytic anemia 01/04/2016  . Poor dentition 01/04/2016  . Acute renal failure ([place]) 01/03/2016  . Metabolic acidosis 14/43/1540  . Diarrhea 01/03/2016  . DOE (dyspnea on exertion)      Past Surgical History:  Procedure Laterality Date  . BASCILIC VEIN TRANSPOSITION Left 01/12/2016   Procedure: LEFT ARM FIRST STAGE [place];  Surgeon: Conrad Cullomburg, MD;  Location: [facility];  Service: Vascular;  Laterality: Left;  . IR GENERIC HISTORICAL  01/09/2016   IR US GUIDE VASC ACCESS RIGHT 01/09/2016 Marybelle Killings, MD [facility]  . IR GENERIC HISTORICAL  01/09/2016   IR FLUORO GUIDE CV LINE RIGHT 01/09/2016 Marybelle Killings, MD [facility]        Home Medications    Prior to Admission medications   Medication Sig Start Date End Date Taking? Authorizing Provider  Acetaminophen (TYLENOL EXTRA STRENGTH PO) Take by mouth.    [provider]  amLODipine (NORVASC) 5 MG tablet Take 1 tablet (5 mg total) by mouth at bedtime. 11/09/17   Truman Hayward, MD  bictegravir-emtricitabine-tenofovir AF (BIKTARVY) 50-200-25 MG TABS tablet Take 1 tablet by mouth daily. 12/05/18   Kuppelweiser, Cassie L, RPH-CPP  darunavir-cobicistat (PREZCOBIX) 800-150 MG tablet Take 1 tablet by mouth daily with breakfast. 12/05/18   Kuppelweiser, Cassie L, RPH-CPP  triamcinolone cream (KENALOG) 0.1 % Apply 1 application topically 2 (two) times daily. 12/05/18   Kuppelweiser, Cassie L, RPH-CPP  valACYclovir (VALTREX) 1000 MG tablet Take 1 tablet (1,000 mg total) by mouth 2 (two) times daily. Patient not  taking: Reported on 01/23/2019 08/17/17   Kuppelweiser, Cherylann Ratel, RPH-CPP    Family History Family History  Problem Relation Age of Onset  . Healthy Mother     Social History Social History   Tobacco Use  . Smoking status: Current Some Day Smoker    Packs/day: 0.20  . Smokeless tobacco: Never Used  Substance Use Topics  . Alcohol use: Yes    Comment: rarely  . Drug use: Yes    Types: Marijuana     Allergies   Patient has no known allergies.   Review of Systems Review of Systems  Ten systems reviewed and are negative for acute change, except as noted in the HPI.  Physical Exam  Updated Vital Signs BP (!) 159/107   Pulse 71   Temp 98.8 F (37.1 C) (Oral)   Resp 17   SpO2 100%   Physical Exam Vitals signs and nursing note reviewed.  Constitutional:      General: He is not in acute distress.    Appearance: He is well-developed. He is not diaphoretic.  HENT:     Head: Normocephalic and atraumatic.  Eyes:     General: No scleral icterus.    Conjunctiva/sclera: Conjunctivae normal.  Neck:     Musculoskeletal: Normal range of motion and neck supple.  Cardiovascular:     Rate and Rhythm: Normal rate and regular rhythm.     Heart sounds: Normal heart sounds.  Pulmonary:     Effort: Pulmonary effort is normal. No respiratory distress.     Breath sounds: Normal breath sounds.  Abdominal:     Palpations: Abdomen is soft.     Tenderness: There is no abdominal tenderness.  Musculoskeletal:     Comments: Left elbow with area of scabbing and desquamated tissue about the size of a nickel.  There is fluctuance.  There is significant swelling without induration from the area just distal to the deltoid insertion all the way down to the wrist.  Normal ipsilateral elbow hand and shoulder examination.  Patient is able to move the elbow joint but not fully straighten it due to pain.  Skin:    General: Skin is warm and dry.  Neurological:     Mental Status: He is alert.  Psychiatric:        Behavior: Behavior normal.            ED Treatments / Results  Labs (all labs ordered are listed, but only abnormal results are displayed) Labs Reviewed  COMPREHENSIVE METABOLIC PANEL - Abnormal; Notable for the following components:      Result Value   Creatinine, Ser 1.98 (*)    Calcium 8.8 (*)    Albumin 3.2 (*)    AST 13 (*)    GFR calc non Af Amer 44 (*)    GFR calc Af Amer 51 (*)    All other components within normal limits  CBC WITH DIFFERENTIAL/PLATELET - Abnormal; Notable for the following components:   Hemoglobin 17.4 (*)    All other components within  normal limits    EKG None  Radiology No results found.  Procedures Procedures (including critical care time)  EMERGENCY DEPARTMENT US SOFT TISSUE INTERPRETATION "Study: Limited Soft Tissue Ultrasound"  INDICATIONS: Soft tissue infection Multiple views of the body part were obtained in real-time with a multi-frequency linear probe  PERFORMED BY: Myself IMAGES ARCHIVED?: Yes SIDE:Left BODY PART:Elbow INTERPRETATION:  Abcess present and Cellulitis present    Medications Ordered in ED Medications - No  data to display   Initial Impression / Assessment and Plan / ED Course  I have reviewed the triage vital signs and the nursing notes.  Pertinent labs & imaging results that were available during my care of the patient were reviewed by me and considered in my medical decision making (see chart for details).         Patient here with history of poor compliance with his medications although he reports he is taking his ARF at this time.  Patient also has history of neurosyphilis untreated.  With large left elbow infection likely of the bursa.  I personally incised and drained and placed a large amount of packing in a loculated infection.  Patient does not wish to be admitted for further work-up.  Patient will be started on doxycycline 100 mg p.o. twice daily for 1 month as this was the current plan per infectious disease due to untreated neurosyphilis.  I have also sent information to Dr. Lucianne Lei dam.  Patient is supposed to follow-up with them in the next 2 days. I discussed return precautions with the patient and given him strict warning to return if he has any worsening in his symptoms, fevers, vomiting, swelling or redness returning up the arm.  Patient appears otherwise appropriate for discharge at this time.  He states that he is able to get his medications.  Final Clinical Impressions(s) / ED Diagnoses   Final diagnoses:  Abscess of bursa of left elbow  Left arm cellulitis     ED Discharge Orders    None       Margarita Mail, PA-C 01/26/19 1539    Carmin Muskrat, MD 01/27/19 2212

## 2019-01-23 NOTE — Discharge Instructions (Signed)
Go to the emergency department for evaluation of your left elbow wound with left arm redness, swelling, and increased warmth.

## 2019-01-23 NOTE — ED Triage Notes (Signed)
Pt reports what he thought was a small pimple came up on his left elbow on Saturday and has progressed into his left bicep and forearm being swollen. Pt has HIV. Denies any known injury

## 2019-01-23 NOTE — ED Provider Notes (Signed)
[place]    CSN: [id number] Arrival date & time: 01/23/19  1434      History   Chief Complaint Chief Complaint  Patient presents with  . Arm Swelling    HPI Jonathon Moore is a 32 y.o. male.   Patient presents with left elbow pain, redness, and swelling x3 days.  He states his symptoms started with a "pimple" on his left elbow which then got bigger and his arm became swollen, red, and hot.  He states he is feeling lightheaded and dizzy x2 days.  Past medical history significant for HIV/AIDS, CKD stage V, and multiple other medical issues.    The history is provided by the patient.    Past Medical History:  Diagnosis Date  . Atypical chest pain 02/24/2016  . Bronchitis   . CKD (chronic kidney disease) stage 5, GFR less than 15 ml/min (HCC) 02/11/2016  . Hemorrhoids   . History of syphilis   . HIV (human immunodeficiency virus infection) ([place])   . HTN (hypertension) 11/09/2017  . Lower back pain 02/24/2016  . Poor compliance 02/09/2017    Patient Active Problem List   Diagnosis Date Noted  . HTN (hypertension) 11/09/2017  . Herpes infection 07/13/2017  . Poor compliance 02/09/2017  . Atypical chest pain 02/24/2016  . Lower back pain 02/24/2016  . CKD (chronic kidney disease) stage 5, GFR less than 15 ml/min (HCC) 02/11/2016  . Cough   . Headache   . History of syphilis   . AKI (acute kidney injury) ([place])   . AIDS (acquired immune deficiency syndrome) ([place])   . 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 Depression   . Normocytic anemia 01/04/2016  . Poor dentition 01/04/2016  . Acute renal failure ([place]) 01/03/2016  . Metabolic acidosis 11/24/9483  . Diarrhea 01/03/2016  . DOE (dyspnea on exertion)     Past Surgical History:  Procedure Laterality Date  . BASCILIC VEIN TRANSPOSITION Left 01/12/2016   Procedure: LEFT ARM FIRST STAGE [place];  Surgeon: Conrad Rafael Capo, MD;  Location: [facility];  Service: Vascular;  Laterality:  Left;  . IR GENERIC HISTORICAL  01/09/2016   IR US GUIDE VASC ACCESS RIGHT 01/09/2016 Marybelle Killings, MD [facility]  . IR GENERIC HISTORICAL  01/09/2016   IR FLUORO GUIDE CV LINE RIGHT 01/09/2016 Marybelle Killings, MD [facility]       Home Medications    Prior to Admission medications   Medication Sig Start Date End Date Taking? Authorizing Provider  Acetaminophen (TYLENOL EXTRA STRENGTH PO) Take by mouth.    [provider]  amLODipine (NORVASC) 5 MG tablet Take 1 tablet (5 mg total) by mouth at bedtime. 11/09/17   Truman Hayward, MD  bictegravir-emtricitabine-tenofovir AF (BIKTARVY) 50-200-25 MG TABS tablet Take 1 tablet by mouth daily. 12/05/18   Kuppelweiser, Cassie L, RPH-CPP  darunavir-cobicistat (PREZCOBIX) 800-150 MG tablet Take 1 tablet by mouth daily with breakfast. 12/05/18   Kuppelweiser, Cassie L, RPH-CPP  triamcinolone cream (KENALOG) 0.1 % Apply 1 application topically 2 (two) times daily. 12/05/18   Kuppelweiser, Cassie L, RPH-CPP  valACYclovir (VALTREX) 1000 MG tablet Take 1 tablet (1,000 mg total) by mouth 2 (two) times daily. Patient not taking: Reported on 01/23/2019 08/17/17   Kuppelweiser, Gillian Shields, RPH-CPP    Family History Family History  Problem Relation Age of Onset  . Healthy Mother     Social History Social History   Tobacco Use  . Smoking status: Current Some Day  Smoker    Packs/day: 0.20  . Smokeless tobacco: Never Used  Substance Use Topics  . Alcohol use: Yes    Comment: rarely  . Drug use: Yes    Types: Marijuana     Allergies   Patient has no known allergies.   Review of Systems Review of Systems  Constitutional: Negative for chills and fever.  HENT: Negative for ear pain and sore throat.   Eyes: Negative for pain and visual disturbance.  Respiratory: Negative for cough and shortness of breath.   Cardiovascular: Negative for chest pain and palpitations.  Gastrointestinal: Negative for abdominal pain and vomiting.  Genitourinary:  Negative for dysuria and hematuria.  Musculoskeletal: Positive for arthralgias. Negative for back pain.  Skin: Positive for color change and wound. Negative for rash.  Neurological: Positive for dizziness and light-headedness. Negative for seizures and syncope.  All other systems reviewed and are negative.    Physical Exam Triage Vital Signs ED Triage Vitals [01/23/19 1511]  Enc Vitals Group     BP (!) 147/98     Pulse Rate 85     Resp 16     Temp 98.4 F (36.9 C)     Temp src      SpO2 99 %     Weight      Height      Head Circumference      Peak Flow      Pain Score      Pain Loc      Pain Edu?      Excl. in [place]?    No data found.  Updated Vital Signs BP (!) 147/98   Pulse 85   Temp 98.4 F (36.9 C)   Resp 16   SpO2 99%   Visual Acuity Right Eye Distance:   Left Eye Distance:   Bilateral Distance:    Right Eye Near:   Left Eye Near:    Bilateral Near:     Physical Exam Vitals signs and nursing note reviewed.  Constitutional:      Appearance: He is well-developed.  HENT:     Head: Normocephalic and atraumatic.  Eyes:     Conjunctiva/sclera: Conjunctivae normal.  Neck:     Musculoskeletal: Neck supple.  Cardiovascular:     Rate and Rhythm: Normal rate and regular rhythm.     Heart sounds: No murmur.  Pulmonary:     Effort: Pulmonary effort is normal. No respiratory distress.     Breath sounds: Normal breath sounds.  Abdominal:     Palpations: Abdomen is soft.     Tenderness: There is no abdominal tenderness.  Musculoskeletal:        General: Swelling present.  Skin:    General: Skin is warm and dry.     Capillary Refill: Capillary refill takes less than 2 seconds.     Findings: Erythema and lesion present.     Comments: Left elbow: fluctuant tender closed lesion.  Left arm: erythematous, edematous, warm from mid-upper arm to mid-forearm.      Neurological:     Mental Status: He is alert.     Sensory: No sensory deficit.     Motor: No  weakness.      UC Treatments / Results  Labs (all labs ordered are listed, but only abnormal results are displayed) Labs Reviewed - No data to display  EKG   Radiology No results found.  Procedures Procedures (including critical care time)  Medications Ordered in UC Medications - No data  to display  Initial Impression / Assessment and Plan / UC Course  I have reviewed the triage vital signs and the nursing notes.  Pertinent labs & imaging results that were available during my care of the patient were reviewed by me and considered in my medical decision making (see chart for details).   Left upper extremity cellulitis.  Left arm with significant edema and increased warmth.  Patient is currently dizzy and lightheaded.  Sending patient to the emergency department for evaluation of possible sepsis.  Patient has a medical history significant for HIV/AIDS, CKD stage V, and multiple other medical conditions.     Final Clinical Impressions(s) / UC Diagnoses   Final diagnoses:  Cellulitis of left upper extremity     Discharge Instructions     Go to the emergency department for evaluation of your left elbow wound with left arm redness, swelling, and increased warmth.        ED Prescriptions    None     Controlled Substance Prescriptions  Controlled Substance Registry consulted? Not Applicable   Sharion Balloon, NP 01/23/19 1542

## 2019-01-23 NOTE — Progress Notes (Signed)
Pharmacy Antibiotic Note  Jonathon Moore is a 33 y.o. male admitted on 01/23/2019 with cellulitis.  Pharmacy has been consulted for vancomycin dosing.  Currently afebrile, wbc normal at 9.5. Scr elevated at 1.98 but appears to be his baseline. Starting IV antibiotics for LUE cellulitis.   Vancomycin 1750 mg IV Q 24 hrs. Goal AUC 400-550. Expected AUC: 520 SCr used: 1.98  Previous ht of 6' Previous wt of 87kg  Plan: Vancomycin 2g IV now then $Remove'1750mg'JrbwYei$  q24 hours     Temp (24hrs), Avg:98.6 F (37 C), Min:98.4 F (36.9 C), Max:98.8 F (37.1 C)  Recent Labs  Lab 01/23/19 1554  WBC 9.5  CREATININE 1.98*    CrCl cannot be calculated (Unknown ideal weight.).    No Known Allergies   Thank you for allowing pharmacy to be a part of this patient's care.  Erin Hearing PharmD., BCPS Clinical Pharmacist 01/23/2019 10:46 PM

## 2019-01-24 MED ORDER — DOXYCYCLINE HYCLATE 100 MG PO CAPS: 100.0000 mg | ORAL_CAPSULE | Freq: Two times a day (BID) | ORAL | 0 refills | Status: AC

## 2019-01-24 MED ORDER — OXYCODONE HCL 5 MG PO TABS: 2.5000 mg | ORAL_TABLET | Freq: Four times a day (QID) | ORAL | 0 refills | Status: DC | PRN

## 2019-01-24 NOTE — ED Notes (Signed)
Pt discharged from ED; instructions provided and scripts given; Pt encouraged to return to ED if symptoms worsen and to f/u with PCP; Pt verbalized understanding of all instructions 

## 2019-01-24 NOTE — ED Provider Notes (Signed)
33 year old male received at signout from [facility] pending antibiotics.  Per her HPI:  "Jonathon Moore is a 33 y.o. male.  With a past medical history of HIV, CKD, history of syphilis, hypertension, poor medication compliance.  Patient's last viral load was 681 and last CD4 count was 335. patient states that he does take his medicine daily.  Patient developed what he thought was a pimple on his elbow 4 days ago.  He has had progressively worsening pain and swelling and now his whole left arm is swollen from just below the deltoid all the way down to his hand.  He denies fevers or chills.  He says that his pain is about a 3 when he keeps his arm still but is worse whenever he tries to extend the elbow."  Physical Exam  BP (!) 163/97   Pulse 67   Temp 98.8 F (37.1 C) (Oral)   Resp 17   SpO2 97%   Physical Exam Vitals signs and nursing note reviewed.  Constitutional:      General: He is not in acute distress.    Appearance: He is well-developed.     Comments: NAD  HENT:     Head: Normocephalic and atraumatic.  Neck:     Musculoskeletal: Normal range of motion.  Pulmonary:     Effort: Pulmonary effort is normal.  Musculoskeletal: Normal range of motion.  Neurological:     Mental Status: He is alert.  Psychiatric:        Behavior: Behavior normal.     ED Course/Procedures     Procedures  MDM  33 year old male with history of HIV, CKD, syphilis, HTN, poor medication compliance received a signout from [facility] pending antibiotics.  Vancomycin antibiotics have been completed.  Patient has no complaints at this time.  All questions answered about his home discharge prescriptions.  I strongly recommended the patient keep his follow-up appointment with infectious disease.  He is hemodynamically stable and in no acute distress.  Safe for discharge home with outpatient follow-up.       Joanne Gavel, PA-C 15/94/70 [id number]    Delora Fuel, MD 18/34/37 [phone]

## 2019-01-24 NOTE — Discharge Instructions (Signed)
Contact a health care provider if: You have a fever. Your symptoms do not begin to improve within 1-2 days of starting treatment. Your bone or joint underneath the infected area becomes painful after the skin has healed. Your infection returns in the same area or another area. You notice a swollen bump in the infected area. You develop new symptoms. You have a general ill feeling (malaise) with muscle aches and pains. Get help right away if: Your symptoms get worse. You feel very sleepy. You develop vomiting or diarrhea that persists. You notice red streaks coming from the infected area. Your red area gets larger or turns dark in color.

## 2019-01-25 ENCOUNTER — Ambulatory Visit (HOSPITAL_COMMUNITY)
Admission: EM | Admit: 2019-01-25 | Discharge: 2019-01-25 | Disposition: A | Discharge status: Home / Self Care | Payer: Self-pay | Attending: Family Medicine | Admitting: Family Medicine

## 2019-01-25 ENCOUNTER — Ambulatory Visit: Payer: Self-pay | Admitting: Infectious Disease

## 2019-01-25 ENCOUNTER — Other Ambulatory Visit: Payer: Self-pay

## 2019-01-25 ENCOUNTER — Encounter (HOSPITAL_COMMUNITY): Payer: Self-pay | Admitting: Emergency Medicine

## 2019-01-25 DIAGNOSIS — Z09 Encounter for follow-up examination after completed treatment for conditions other than malignant neoplasm: Secondary | ICD-10-CM

## 2019-01-25 DIAGNOSIS — B20 Human immunodeficiency virus [HIV] disease: Secondary | ICD-10-CM

## 2019-01-25 DIAGNOSIS — I10 Essential (primary) hypertension: Secondary | ICD-10-CM

## 2019-01-25 NOTE — ED Provider Notes (Signed)
Jonathon Moore    CSN: [id number] Arrival date & time: 01/25/19  1156      History   Chief Complaint Chief Complaint  Patient presents with  . Wound Check    HPI Jonathon Moore is a 33 y.o. male.   HPI  Here for wound check.  He was seen here on 01/23/2019 and then transferred to the emergency room for a large abscess over his left olecranon bursa that included cellulitis that extended from the deltoid to the wrist.  Very painful arm.  He was given pain medication in the emergency room, IV antibiotics, and then had an I&D performed.  The bandage from the ER is still in place.  He has not yet filled his prescription for antibiotics.  He states that he cannot afford them.  He has not needed medication for pain.  He denies any fever or chills.  He states that the pain in his arm is improved. Patient has HIV.  Poor compliance.  He is due for blood work.  I noticed that he was called by Dr. Drucilla Schmidt today.  Patient was not available for the visit.  I called Dr. Drucilla Schmidt who advises the patient to get his routine blood work, standing orders are in place.  He will then schedule him for another appointment.  Dr. Drucilla Schmidt informs me that the patient's antibiotic would be free at the pharmacy. Past Medical History:  Diagnosis Date  . Atypical chest pain 02/24/2016  . Bronchitis   . CKD (chronic kidney disease) stage 5, GFR less than 15 ml/min (HCC) 02/11/2016  . Hemorrhoids   . History of syphilis   . HIV (human immunodeficiency virus infection) ([place])   . HTN (hypertension) 11/09/2017  . Lower back pain 02/24/2016  . Poor compliance 02/09/2017    Patient Active Problem List   Diagnosis Date Noted  . HTN (hypertension) 11/09/2017  . Herpes infection 07/13/2017  . Poor compliance 02/09/2017  . Atypical chest pain 02/24/2016  . Lower back pain 02/24/2016  . CKD (chronic kidney disease) stage 5, GFR less than 15 ml/min (HCC) 02/11/2016  . Cough   . Headache   . History of  syphilis   . AKI (acute kidney injury) ([place])   . AIDS (acquired immune deficiency syndrome) ([place])   . 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 Depression   . Normocytic anemia 01/04/2016  . Poor dentition 01/04/2016  . Acute renal failure ([place]) 01/03/2016  . Metabolic acidosis 57/84/6962  . Diarrhea 01/03/2016  . DOE (dyspnea on exertion)     Past Surgical History:  Procedure Laterality Date  . BASCILIC VEIN TRANSPOSITION Left 01/12/2016   Procedure: LEFT ARM FIRST STAGE [place];  Surgeon: Conrad March ARB, MD;  Location: [facility];  Service: Vascular;  Laterality: Left;  . IR GENERIC HISTORICAL  01/09/2016   IR US GUIDE VASC ACCESS RIGHT 01/09/2016 Marybelle Killings, MD [facility]  . IR GENERIC HISTORICAL  01/09/2016   IR FLUORO GUIDE CV LINE RIGHT 01/09/2016 Marybelle Killings, MD [facility]       Home Medications    Prior to Admission medications   Medication Sig Start Date End Date Taking? Authorizing Provider  acetaminophen (TYLENOL) 500 MG tablet Take 500-1,000 mg by mouth every 6 (six) hours as needed (pain).     [provider]  amLODipine (NORVASC) 5 MG tablet Take 1 tablet (5 mg total) by mouth at bedtime. 11/09/17   Truman Hayward, MD  bictegravir-emtricitabine-tenofovir AF (  BIKTARVY) 50-200-25 MG TABS tablet Take 1 tablet by mouth daily. 12/05/18   Kuppelweiser, Cassie L, RPH-CPP  darunavir-cobicistat (PREZCOBIX) 800-150 MG tablet Take 1 tablet by mouth daily with breakfast. 12/05/18   Kuppelweiser, Cassie L, RPH-CPP  doxycycline (VIBRAMYCIN) 100 MG capsule Take 1 capsule (100 mg total) by mouth 2 (two) times daily. 01/24/19 02/23/19  Arthor Captain, PA-C  oxyCODONE (ROXICODONE) 5 MG immediate release tablet Take 0.5-1 tablets (2.5-5 mg total) by mouth every 6 (six) hours as needed for severe pain. 01/24/19   Arthor Captain, PA-C  triamcinolone cream (KENALOG) 0.1 % Apply 1 application topically 2 (two) times daily. 12/05/18   Kuppelweiser, Cassie L, RPH-CPP   valACYclovir (VALTREX) 1000 MG tablet Take 1 tablet (1,000 mg total) by mouth 2 (two) times daily. Patient not taking: Reported on 01/23/2019 08/17/17   Kuppelweiser, Cherylann Ratel, RPH-CPP    Family History Family History  Problem Relation Age of Onset  . Healthy Mother     Social History Social History   Tobacco Use  . Smoking status: Current Some Day Smoker    Packs/day: 0.20  . Smokeless tobacco: Never Used  Substance Use Topics  . Alcohol use: Yes    Comment: rarely  . Drug use: Yes    Types: Marijuana     Allergies   Patient has no known allergies.   Review of Systems Review of Systems  Constitutional: Negative for chills and fever.  HENT: Negative for ear pain and sore throat.   Eyes: Negative for pain and visual disturbance.  Respiratory: Negative for cough and shortness of breath.   Cardiovascular: Negative for chest pain and palpitations.  Gastrointestinal: Negative for abdominal pain and vomiting.  Genitourinary: Negative for dysuria and hematuria.  Musculoskeletal: Positive for joint swelling. Negative for arthralgias and back pain.  Skin: Positive for color change. Negative for rash.  Neurological: Negative for seizures and syncope.  All other systems reviewed and are negative.    Physical Exam Triage Vital Signs ED Triage Vitals [01/25/19 1231]  Enc Vitals Group     BP (!) 135/94     Pulse Rate 71     Resp 18     Temp (!) 97.5 F (36.4 C)     Temp Source Oral     SpO2 96 %     Weight      Height      Head Circumference      Peak Flow      Pain Score 0     Pain Loc      Pain Edu?      Excl. in [place]?    No data found.  Updated Vital Signs BP (!) 135/94 (BP Location: Right Arm)   Pulse 71   Temp (!) 97.5 F (36.4 C) (Oral)   Resp 18   SpO2 96%   Visual Acuity Right Eye Distance:   Left Eye Distance:   Bilateral Distance:    Right Eye Near:   Left Eye Near:    Bilateral Near:     Physical Exam Constitutional:      General: He is  not in acute distress.    Appearance: He is well-developed and normal weight.  HENT:     Head: Normocephalic and atraumatic.  Eyes:     Conjunctiva/sclera: Conjunctivae normal.     Pupils: Pupils are equal, round, and reactive to light.  Neck:     Musculoskeletal: Normal range of motion.  Cardiovascular:     Rate and Rhythm:  Normal rate.  Pulmonary:     Effort: Pulmonary effort is normal. No respiratory distress.  Abdominal:     General: There is no distension.     Palpations: Abdomen is soft.  Musculoskeletal: Normal range of motion.  Skin:    General: Skin is warm and dry.     Comments: Patient is in no acute distress.  Patient still resists full movement of his left arm.  He has swelling and warmth.  The dressing is removed.  There is a large amount of 10 thick purulence.  The wick is removed about 6 inches.  I then cleaned the wound, debrided away the dead skin, and placed a new bandage on.  He is told he needs to come in every other day for bandage changes.  Neurological:     Mental Status: He is alert.      UC Treatments / Results  Labs (all labs ordered are listed, but only abnormal results are displayed) Labs Reviewed - No data to display  EKG   Radiology No results found.  Procedures Procedures (including critical care time)  Medications Ordered in UC Medications - No data to display  Initial Impression / Assessment and Plan / UC Course  I have reviewed the triage vital signs and the nursing notes.  Pertinent labs & imaging results that were available during my care of the patient were reviewed by me and considered in my medical decision making (see chart for details).     Discussed the importance of taking antibiotics.  Discussed the importance of compliance with his HIV medication and treatment. Final Clinical Impressions(s) / UC Diagnoses   Final diagnoses:  Encounter for recheck of abscess following incision and drainage     Discharge  Instructions     Please go get the antibiotics and start them today Take 2 doses today Call Dr Lucianne Lei Dams office for an appointment You need to go to the clinic for lab work Need to come here in 2 days for a wound check Call for an appointment   ED Prescriptions    None     Controlled Substance Prescriptions [place] Controlled Substance Registry consulted? Not Applicable   Raylene Everts, MD 01/25/19 [phone]

## 2019-01-25 NOTE — Discharge Instructions (Addendum)
Please go get the antibiotics and start them today Take 2 doses today Call Dr Lucianne Lei Dams office for an appointment You need to go to the clinic for lab work Need to come here in 2 days for a wound check Call for an appointment

## 2019-01-25 NOTE — Progress Notes (Signed)
Patient not picking up phone after multiple attempts

## 2019-01-25 NOTE — ED Triage Notes (Addendum)
Pt here for wound check to left elbow after having drained 2 days ago; pt sts he hasnt picked up antibiotics yet

## 2019-02-12 ENCOUNTER — Telehealth: Payer: Self-pay

## 2019-02-12 NOTE — Telephone Encounter (Signed)
COVID-19 Pre-Screening Questions:02/12/19   Do you currently have a fever (>100 F), chills or unexplained body aches? NO  Are you currently experiencing new cough, shortness of breath, sore throat, runny nose? NO   Have you recently travelled outside the state of [place] in the last 14 days? NO .  Have you been in contact with someone that is currently pending confirmation of Covid19 testing or has been confirmed to have the [place] virus?  NO  **If the patient answers NO to ALL questions -  advise the patient to please call the clinic before coming to the office should any symptoms develop.

## 2019-02-13 ENCOUNTER — Encounter: Payer: Self-pay | Admitting: Infectious Disease

## 2019-02-13 ENCOUNTER — Ambulatory Visit: Payer: Self-pay

## 2019-02-13 ENCOUNTER — Ambulatory Visit (INDEPENDENT_AMBULATORY_CARE_PROVIDER_SITE_OTHER): Payer: Self-pay | Admitting: Infectious Disease

## 2019-02-13 ENCOUNTER — Other Ambulatory Visit: Payer: Self-pay

## 2019-02-13 VITALS — BP 157/103 | HR 70 | Temp 98.6°F

## 2019-02-13 DIAGNOSIS — N08 Glomerular disorders in diseases classified elsewhere: Secondary | ICD-10-CM

## 2019-02-13 DIAGNOSIS — Z91199 Patient's noncompliance with other medical treatment and regimen due to unspecified reason: Secondary | ICD-10-CM

## 2019-02-13 DIAGNOSIS — Z23 Encounter for immunization: Secondary | ICD-10-CM

## 2019-02-13 DIAGNOSIS — A523 Neurosyphilis, unspecified: Secondary | ICD-10-CM

## 2019-02-13 DIAGNOSIS — I10 Essential (primary) hypertension: Secondary | ICD-10-CM

## 2019-02-13 DIAGNOSIS — B20 Human immunodeficiency virus [HIV] disease: Secondary | ICD-10-CM

## 2019-02-13 DIAGNOSIS — N185 Chronic kidney disease, stage 5: Secondary | ICD-10-CM

## 2019-02-13 DIAGNOSIS — Z9119 Patient's noncompliance with other medical treatment and regimen: Secondary | ICD-10-CM

## 2019-02-13 HISTORY — DX: Neurosyphilis, unspecified: A52.3

## 2019-02-13 NOTE — Progress Notes (Signed)
Chief complaint: no new complaints  Subjective:    Chief complaint: depression   Patient ID: Jonathon Moore, male    DOB: [birth year], 33 y.o.   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  He had received 2 weeks of intravenous penicillin for his neurosyphilis.  In the hospital I started him on a regimen of TIVICAY 50 mg daily with AZT 300 mg daily and Epivir 50 mg daily.  After discharge the hospital there was a mixup apparently in his medications and he actually came to our clinic on TIVICAY 50 mg daily and Combivir 1 tablet twice daily. He has has haven't changed to 2 tablets of Epzicom for 6 mg daily along with Epivir 50 mg and continued TIVICAY at 50 mg daily.   While following him post hospital discharge he had not only nearly suppressed his virus to undetectable levels and raised his CD4 above 300 but his serum creatinine had dropped from 9 to 2.58. We changed him to [facility] one pill once daily which he has been taking. His serum creatinine nearly normalized and his CD4 count rose as he maintained proper virological suppression.     He then fell out of care and had  not made further follow-up visits with [place] he finally had labs done a few weeks ago which showed his viral load to have skyrocketed above 170,000 CD4 count to have dropped into the 100s. His serum creatinine had also risen above 2.  We switched him over to making sure he gets his meds from [facility] and VL came down to 1k and CD4 to 200. He was admitted to still missing 1-2 doses per week. EMphasized tightening this up.   We then changed him to [facility].  He then had VIROLOGICAL FAILURE W RESISTANCE  [id number]    We im to  [place] he was then changed to Prezcobix and BIKTARVY.   He has had some intermittent adherence to his antiretrovirals and last 3  viral load to represent a blip  Lab Results  Component Value Date   HIV1RNAQUANT CANCELED 12/05/2018   HIV1RNAQUANT 681 (H) 08/15/2018   HIV1RNAQUANT 116 (H) 03/23/2018    He was seen in the ER and had to have a foreign body removed from his rectum in June.  His syphilis titers have shot up to 1-128 in July.  We made multiple attempts to get him into care for evaluation for neurosyphilis but he failed to respond.  He is here for clinic today and I offered to admit him to the hospital to either have him undergo a lumbar puncture to rule in or rule out neurosyphilis versus simply proceeding to placement of a central line and to retreating him with 2 weeks of intravenous penicillin.  I would favor the latter given his history he did not agree to come in today but agreed to be admitted tomorrow so I certainly hope you will shop tomorrow   Past Medical History:  Diagnosis Date  . Atypical chest pain 02/24/2016  . Bronchitis   . CKD (chronic kidney disease) stage 5, GFR less than 15 ml/min (HCC) 02/11/2016  . Hemorrhoids   . History of syphilis   . HIV (human immunodeficiency virus infection) ([place])   . HTN (hypertension) 11/09/2017  . Lower back pain 02/24/2016  . Poor compliance 02/09/2017    Past Surgical History:  Procedure Laterality Date  . BASCILIC VEIN TRANSPOSITION Left 01/12/2016   Procedure: LEFT ARM FIRST STAGE [place];  Surgeon: Conrad [place], MD;  Location: [place];  Service: Vascular;  Laterality: Left;  . IR GENERIC HISTORICAL  01/09/2016   IR US GUIDE VASC ACCESS RIGHT 01/09/2016 Marybelle Killings, MD [facility]-INTERV RAD  . IR GENERIC HISTORICAL  01/09/2016   IR FLUORO GUIDE CV LINE RIGHT 01/09/2016 Marybelle Killings, MD [facility]    Family History  Problem Relation Age of Onset  . Healthy Mother       Social History   Socioeconomic History  . Marital status: Single    Spouse name: Not on file  . Number of children: Not on file  . Years of education: Not on file   . Highest education level: Not on file  Occupational History  . Not on file  Social Needs  . Financial resource strain: Not on file  . Food insecurity    Worry: Not on file    Inability: Not on file  . Transportation needs    Medical: Not on file    Non-medical: Not on file  Tobacco Use  . Smoking status: Current Some Day Smoker    Packs/day: 0.20  . Smokeless tobacco: Never Used  Substance and Sexual Activity  . Alcohol use: Yes    Comment: rarely  . Drug use: Yes    Types: Marijuana  . Sexual activity: Never    Partners: Male    Birth control/protection: Condom    Comment: given condoms  Lifestyle  . Physical activity    Days per week: Not on file    Minutes per session: Not on file  . Stress: Not on file  Relationships  . Social Herbalist on phone: Not on file    Gets together: Not on file    Attends religious service: Not on file    Active member of club or organization: Not on file    Attends meetings of clubs or organizations: Not on file    Relationship status: Not on file  Other Topics Concern  . Not on file  Social History Narrative  . Not on file    No Known Allergies   Current Outpatient Medications:  .  acetaminophen (TYLENOL) 500 MG tablet, Take 500-1,000 mg by mouth every 6 (six) hours as needed (pain). , Disp: , Rfl:  .  amLODipine (NORVASC) 5 MG tablet, Take 1 tablet (5 mg total) by mouth at bedtime., Disp: 30 tablet, Rfl: 11 .  bictegravir-emtricitabine-tenofovir AF (BIKTARVY) 50-200-25 MG TABS tablet, Take 1 tablet by mouth daily., Disp: 30 tablet, Rfl: 3 .  darunavir-cobicistat (PREZCOBIX) 800-150 MG tablet, Take 1 tablet by mouth daily with breakfast., Disp: 30 tablet, Rfl: 3 .  doxycycline (VIBRAMYCIN) 100 MG capsule, Take 1 capsule (100 mg total) by mouth 2 (two) times daily., Disp: 60 capsule, Rfl: 0 .  triamcinolone cream (KENALOG) 0.1 %, Apply 1 application topically 2 (two) times daily., Disp: 453 g, Rfl: 4 .  oxyCODONE  (ROXICODONE) 5 MG immediate release tablet, Take 0.5-1 tablets (2.5-5 mg total) by mouth every 6 (six) hours as needed for severe pain. (Patient not taking: Reported on 02/13/2019), Disp: 8 tablet, Rfl: 0 .  valACYclovir (VALTREX) 1000 MG tablet, Take 1 tablet (1,000 mg total) by mouth 2 (two) times daily. (Patient not taking: Reported on 01/23/2019), Disp: 20 tablet, Rfl: 0   Review of Systems  Constitutional: Negative for chills, diaphoresis  and fever.  HENT: Negative for congestion, hearing loss, sore throat and tinnitus.   Respiratory: Negative for cough, shortness of breath and wheezing.   Cardiovascular: Negative for palpitations.  Gastrointestinal: Negative for abdominal pain, blood in stool, constipation, diarrhea, nausea and vomiting.  Genitourinary: Negative for dysuria, flank pain and hematuria.  Musculoskeletal: Negative for myalgias.  Skin: Negative for rash.  Neurological: Negative for dizziness, weakness and headaches.  Hematological: Does not bruise/bleed easily.  Psychiatric/Behavioral: Negative for suicidal ideas.       Objective:   Physical Exam  Constitutional: He is oriented to person, place, and time. He appears well-developed and well-nourished. No distress.  HENT:  Head: Normocephalic and atraumatic.  Mouth/Throat: No oropharyngeal exudate.  Eyes: Conjunctivae and EOM are normal. No scleral icterus.  Neck: Normal range of motion. Neck supple.  Cardiovascular: Normal rate, regular rhythm and normal heart sounds. Exam reveals no gallop and no friction rub.  No murmur heard. Pulmonary/Chest: Effort normal and breath sounds normal. No respiratory distress. He has no wheezes. He has no rales. He exhibits no tenderness.  Abdominal: He exhibits no distension.  Musculoskeletal:        General: No tenderness.  Neurological: He is alert and oriented to person, place, and time. He exhibits normal muscle tone. Coordination normal.  Skin: Skin is warm and dry. No rash  noted. He is not diaphoretic. No erythema. No pallor.  Psychiatric: His speech is delayed. He is slowed.          Assessment & Plan:   HIV/AIDS:   He is on Biktarvy and Prezcobix was a fairly unusual regimen he did have some resistance to NNRTIs but I do not know that he really needs this extensive of a regimen perhaps he could be simplified to something that still has a high barrier to resistance but is easy to take.  It would be easier if he would engage in care on a more consistent basis we will check a repeat viral load today since the last one failed to be run   Neurosyphilis: I would like to admit him to the hospital and I think rather than spend time getting CT scans and lumbar punctures I would recommend simply having a central line placed and treating him for 2 weeks with intravenous penicillin   HIVAN: I worry very much about this happening again and I worry about his kidney function overall given his poorly controlled hypertension  Hypertension: Poorly controlled he states that he did not take his blood pressure medicine today which does not make much sense if he wants to make a good impression about his ability to adhere to antihypertensives and manage his blood pressure   CKD: we will hope his HIVAN does not recur BMP Latest Ref Rng & Units 01/23/2019 12/05/2018 11/21/2018  Glucose 70 - 99 mg/dL 85 71 56(L)  BUN 6 - 20 mg/dL $Remove'9 22 17  'qFwpdUg$ Creatinine 0.61 - 1.24 mg/dL 1.98(H) 1.94(H) 2.01(H)  BUN/Creat Ratio 6 - 22 (calc) - 11 -  Sodium 135 - 145 mmol/L 139 141 139  Potassium 3.5 - 5.1 mmol/L 4.4 4.4 3.9  Chloride 98 - 111 mmol/L 104 105 105  CO2 22 - 32 mmol/L $RemoveB'24 27 24  'LrYGZDSg$ Calcium 8.9 - 10.3 mg/dL 8.8(L) 9.2 8.8(L)   kidneys.  And in coordination of his  care.

## 2019-02-14 ENCOUNTER — Ambulatory Visit: Payer: Self-pay | Admitting: Infectious Disease

## 2019-02-23 ENCOUNTER — Telehealth: Payer: Self-pay | Admitting: *Deleted

## 2019-02-23 NOTE — Telephone Encounter (Signed)
RN left message asking Jonathon Moore to call his doctor's office. Landis Gandy, RN

## 2019-02-23 NOTE — Telephone Encounter (Signed)
-----   Message from Truman Hayward, MD sent at 02/22/2019  7:39 PM EDT ----- I imaging Jonathon Moore has not been taking his meds at all. Hard to imagine he developed R to the ARV esp DRV and DTG that he is on. HOpefully he is seeing me again soon. Can we check on how often he has been refilling his meds?

## 2019-02-26 NOTE — Telephone Encounter (Signed)
He needs to get his meds and he needs to be seen in clinic again. There are no followups listed  Maybe needMitch help?

## 2019-02-26 NOTE — Telephone Encounter (Signed)
thx so much Shaquenia!

## 2019-03-01 LAB — HIV RNA, RTPCR W/R GT (RTI, PI,INT)
HIV 1 RNA Quant: 61600 copies/mL — ABNORMAL HIGH
HIV-1 RNA Quant, Log: 4.79 Log copies/mL — ABNORMAL HIGH

## 2019-03-01 LAB — HIV-1 INTEGRASE GENOTYPE

## 2019-03-01 LAB — HIV-1 GENOTYPE: HIV-1 Genotype: DETECTED — AB

## 2019-05-09 ENCOUNTER — Encounter: Payer: Self-pay | Admitting: Infectious Disease

## 2019-05-09 NOTE — Progress Notes (Signed)
Patient ID: Jonathon Moore, male   DOB: 09-08-1985, 33 y.o.   MRN: [id number] Called patient from Viral Load List  Left message to call for appointment

## 2019-06-07 ENCOUNTER — Encounter: Payer: Self-pay | Admitting: Infectious Disease

## 2019-06-07 NOTE — Progress Notes (Signed)
Patient ID: Jonathon Moore, male   DOB: 10-14-1985, 34 y.o.   MRN: [id number] Working [place] left voice mail to call for appointment

## 2019-08-09 ENCOUNTER — Encounter: Payer: Self-pay | Admitting: Infectious Disease

## 2019-08-09 NOTE — Progress Notes (Signed)
Patient ID: Jonathon Moore, male   DOB: [birth year], 34 y.o.   MRN: [id number] Working Viral Load List called [place] to schedule appointment and renew adap

## 2019-10-29 ENCOUNTER — Encounter (HOSPITAL_COMMUNITY): Payer: Self-pay | Admitting: Emergency Medicine

## 2019-10-29 ENCOUNTER — Other Ambulatory Visit: Payer: Self-pay

## 2019-10-29 ENCOUNTER — Ambulatory Visit (HOSPITAL_COMMUNITY)
Admission: EM | Admit: 2019-10-29 | Discharge: 2019-10-29 | Disposition: A | Discharge status: Home / Self Care | Payer: HRSA Program | Attending: Family Medicine | Admitting: Family Medicine

## 2019-10-29 ENCOUNTER — Ambulatory Visit (INDEPENDENT_AMBULATORY_CARE_PROVIDER_SITE_OTHER): Payer: HRSA Program

## 2019-10-29 DIAGNOSIS — Z79899 Other long term (current) drug therapy: Secondary | ICD-10-CM | POA: Diagnosis not present

## 2019-10-29 DIAGNOSIS — N189 Chronic kidney disease, unspecified: Secondary | ICD-10-CM | POA: Insufficient documentation

## 2019-10-29 DIAGNOSIS — J329 Chronic sinusitis, unspecified: Secondary | ICD-10-CM | POA: Diagnosis not present

## 2019-10-29 DIAGNOSIS — B20 Human immunodeficiency virus [HIV] disease: Secondary | ICD-10-CM | POA: Diagnosis not present

## 2019-10-29 DIAGNOSIS — N185 Chronic kidney disease, stage 5: Secondary | ICD-10-CM | POA: Diagnosis not present

## 2019-10-29 DIAGNOSIS — R05 Cough: Secondary | ICD-10-CM | POA: Insufficient documentation

## 2019-10-29 DIAGNOSIS — R062 Wheezing: Secondary | ICD-10-CM | POA: Diagnosis not present

## 2019-10-29 DIAGNOSIS — Z7901 Long term (current) use of anticoagulants: Secondary | ICD-10-CM | POA: Diagnosis not present

## 2019-10-29 DIAGNOSIS — Z20822 Contact with and (suspected) exposure to covid-19: Secondary | ICD-10-CM | POA: Diagnosis not present

## 2019-10-29 DIAGNOSIS — R0602 Shortness of breath: Secondary | ICD-10-CM | POA: Diagnosis not present

## 2019-10-29 DIAGNOSIS — I120 Hypertensive chronic kidney disease with stage 5 chronic kidney disease or end stage renal disease: Secondary | ICD-10-CM | POA: Insufficient documentation

## 2019-10-29 MED ORDER — BENZONATATE 100 MG PO CAPS
100.0000 mg | ORAL_CAPSULE | Freq: Three times a day (TID) | ORAL | 0 refills | Status: DC
Start: 2019-10-29 — End: 2019-11-21

## 2019-10-29 MED ORDER — FLUTICASONE PROPIONATE 50 MCG/ACT NA SUSP: 1.0000 | Freq: Every day | NASAL | 0 refills | Status: AC

## 2019-10-29 MED ORDER — SALINE SPRAY 0.65 % NA SOLN: 1.0000 | NASAL | 0 refills | Status: AC | PRN

## 2019-10-29 MED ORDER — PREDNISONE 10 MG PO TABS: 40.0000 mg | ORAL_TABLET | Freq: Every day | ORAL | 0 refills | Status: AC

## 2019-10-29 MED ORDER — DOXYCYCLINE HYCLATE 100 MG PO CAPS
100.0000 mg | ORAL_CAPSULE | Freq: Two times a day (BID) | ORAL | 0 refills | Status: AC
Start: 2019-10-29 — End: 2019-11-05

## 2019-10-29 MED ORDER — ALBUTEROL SULFATE HFA 108 (90 BASE) MCG/ACT IN AERS: 1.0000 | INHALATION_SPRAY | Freq: Four times a day (QID) | RESPIRATORY_TRACT | 0 refills | Status: DC | PRN

## 2019-10-29 NOTE — ED Provider Notes (Addendum)
[facility]    CSN: [id number] Arrival date & time: 10/29/19  1240      History   Chief Complaint Chief Complaint  Patient presents with  . Cough    HPI Jonathon Moore is a 34 y.o. male.   Patient with history of HIV, CKD, hypertension presents for 2 weeks history of cough and nighttime shortness of breath.  He reports his cough is significantly worse at night when laying down and is having shortness of breath.  He reports occasional productive cough.  Reports feels like things are draining down his throat triggering cough and they will become short of breath.  He also reports feeling some wheezing occasionally.  He reports this episode has been 2 weeks in duration however he had 2 other episodes throughout May.  He denies fever or chills.  Does report some nasal congestion and sinus pressure.  Denies headache.  Denies nausea, vomiting diarrhea.  Denies again abdominal pain.  Denies lower extremity edema.   He reports he is compliant with his HIV medicines.  He does not have a primary care provider.  He does see [facility] for HIV management.     Past Medical History:  Diagnosis Date  . Atypical chest pain 02/24/2016  . Bronchitis   . CKD (chronic kidney disease) stage 5, GFR less than 15 ml/min (HCC) 02/11/2016  . Hemorrhoids   . History of syphilis   . HIV (human immunodeficiency virus infection) ([place])   . HTN (hypertension) 11/09/2017  . Lower back pain 02/24/2016  . 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 Poor compliance 02/09/2017    Patient Active Problem List   Diagnosis Date Noted  . 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 HTN (hypertension) 11/09/2017  . Herpes infection 07/13/2017  . Poor compliance 02/09/2017  . Atypical chest pain 02/24/2016  . Lower back pain 02/24/2016  . CKD (chronic kidney disease) stage 5, GFR less than 15 ml/min (HCC) 02/11/2016  . Cough   . Headache   . History of syphilis   . AKI (acute kidney injury) ([place])   . AIDS  (acquired immune deficiency syndrome) ([place])   . HIVAN (HIV-associated nephropathy) ([place])   . Depression   . Normocytic anemia 01/04/2016  . Poor dentition 01/04/2016  . Acute renal failure ([place]) 01/03/2016  . Metabolic acidosis 63/81/7711  . Diarrhea 01/03/2016  . DOE (dyspnea on exertion)     Past Surgical History:  Procedure Laterality Date  . BASCILIC VEIN TRANSPOSITION Left 01/12/2016   Procedure: LEFT ARM FIRST STAGE [place];  Surgeon: Conrad [place], MD;  Location: [place];  Service: Vascular;  Laterality: Left;  . IR GENERIC HISTORICAL  01/09/2016   IR US GUIDE VASC ACCESS RIGHT 01/09/2016 Marybelle Killings, MD [facility]INTER[facility]  . IR GENERIC HISTORICAL  01/09/2016   IR FLUORO GUIDE CV LINE RIGHT 01/09/2016 Marybelle Killings, MD [facility]       Home Medications    Prior to Admission medications   Medication Sig Start Date End Date Taking? Authorizing Provider  acetaminophen (TYLENOL) 500 MG tablet Take 500-1,000 mg by mouth every 6 (six) hours as needed (pain).     [provider]  albuterol (VENTOLIN HFA) 108 (90 Base) MCG/ACT inhaler Inhale 1-2 puffs into the lungs every 6 (six) hours as needed for wheezing or shortness of breath. 10/29/19   Sharie Amorin, Marguerita Beards, PA-C  amLODipine (NORVASC) 5 MG tablet Take 1 tablet (5 mg total) by mouth at bedtime. 11/09/17   Tommy Medal,  Lavell Islam, MD  benzonatate (TESSALON) 100 MG capsule Take 1 capsule (100 mg total) by mouth every 8 (eight) hours. 10/29/19   Lashawnta Burgert, Marguerita Beards, PA-C  bictegravir-emtricitabine-tenofovir AF (BIKTARVY) 50-200-25 MG TABS tablet Take 1 tablet by mouth daily. 12/05/18   Kuppelweiser, Cassie L, RPH-CPP  darunavir-cobicistat (PREZCOBIX) 800-150 MG tablet Take 1 tablet by mouth daily with breakfast. 12/05/18   Kuppelweiser, Cassie L, RPH-CPP  doxycycline (VIBRAMYCIN) 100 MG capsule Take 1 capsule (100 mg total) by mouth 2 (two) times daily for 7 days. 10/29/19 11/05/19  Taveon Enyeart, Marguerita Beards, PA-C  fluticasone (FLONASE) 50 MCG/ACT  nasal spray Place 1 spray into both nostrils daily. 10/29/19   Eleonore Shippee, Marguerita Beards, PA-C  oxyCODONE (ROXICODONE) 5 MG immediate release tablet Take 0.5-1 tablets (2.5-5 mg total) by mouth every 6 (six) hours as needed for severe pain. Patient not taking: Reported on 02/13/2019 01/24/19   Margarita Mail, PA-C  predniSONE (DELTASONE) 10 MG tablet Take 4 tablets (40 mg total) by mouth daily with breakfast for 3 days. 10/29/19 11/01/19  Tyronne Blann, Marguerita Beards, PA-C  sodium chloride (OCEAN) 0.65 % SOLN nasal spray Place 1 spray into both nostrils as needed for congestion. 10/29/19   Eugune Sine, Marguerita Beards, PA-C  triamcinolone cream (KENALOG) 0.1 % Apply 1 application topically 2 (two) times daily. 12/05/18   Kuppelweiser, Cassie L, RPH-CPP  valACYclovir (VALTREX) 1000 MG tablet Take 1 tablet (1,000 mg total) by mouth 2 (two) times daily. Patient not taking: Reported on 01/23/2019 08/17/17   Kuppelweiser, Gillian Shields, RPH-CPP    Family History Family History  Problem Relation Age of Onset  . Healthy Mother     Social History Social History   Tobacco Use  . Smoking status: Current Some Day Smoker    Packs/day: 0.20  . Smokeless tobacco: Never Used  Substance Use Topics  . Alcohol use: Yes    Comment: rarely  . Drug use: Yes    Types: Marijuana     Allergies   Patient has no known allergies.   Review of Systems Review of Systems   Physical Exam Triage Vital Signs ED Triage Vitals  Enc Vitals Group     BP 10/29/19 1340 (!) 157/98     Pulse Rate 10/29/19 1340 90     Resp 10/29/19 1340 20     Temp 10/29/19 1340 98.5 F (36.9 C)     Temp Source 10/29/19 1340 Oral     SpO2 10/29/19 1340 98 %     Weight --      Height --      Head Circumference --      Peak Flow --      Pain Score 10/29/19 1338 0     Pain Loc --      Pain Edu? --      Excl. in [place]? --    No data found.  Updated Vital Signs BP (!) 157/98 (BP Location: Left Arm)   Pulse 90   Temp 98.5 F (36.9 C) (Oral)   Resp 20   SpO2 98%   Visual  Acuity Right Eye Distance:   Left Eye Distance:   Bilateral Distance:    Right Eye Near:   Left Eye Near:    Bilateral Near:     Physical Exam Vitals and nursing note reviewed.  Constitutional:      General: He is not in acute distress.    Appearance: Normal appearance. He is well-developed. He is not ill-appearing.  HENT:  Head: Normocephalic and atraumatic.     Nose: Congestion and rhinorrhea present.     Mouth/Throat:     Mouth: Mucous membranes are moist.     Comments: Postnasal drip in oropharynx Eyes:     Extraocular Movements: Extraocular movements intact.     Conjunctiva/sclera: Conjunctivae normal.     Pupils: Pupils are equal, round, and reactive to light.  Cardiovascular:     Rate and Rhythm: Normal rate and regular rhythm.     Heart sounds: No murmur.  Pulmonary:     Effort: Pulmonary effort is normal. No respiratory distress.     Breath sounds: Wheezing (There are expiratory wheezes throughout the lower fields bilaterally) present. No rhonchi or rales.  Abdominal:     Palpations: Abdomen is soft.     Tenderness: There is no abdominal tenderness.  Musculoskeletal:     Cervical back: Neck supple.     Right lower leg: No edema.     Left lower leg: No edema.  Skin:    General: Skin is warm and dry.  Neurological:     Mental Status: He is alert.      UC Treatments / Results  Labs (all labs ordered are listed, but only abnormal results are displayed) Labs Reviewed  SARS CORONAVIRUS 2 (TAT 6-24 HRS)    EKG   Radiology DG Chest 2 View  Result Date: 10/29/2019 CLINICAL DATA:  Cough and shortness of breath EXAM: CHEST - 2 VIEW COMPARISON:  November 20, 2018 FINDINGS: The lungs are clear. The heart size and pulmonary vascularity are normal. No adenopathy. There is lower thoracic dextroscoliosis. IMPRESSION: Lungs clear.  Cardiac silhouette normal.  No adenopathy. Electronically Signed   By: Lowella Grip III M.D.   On: 10/29/2019 14:48     Procedures Procedures (including critical care time)  Medications Ordered in UC Medications - No data to display  Initial Impression / Assessment and Plan / UC Course  I have reviewed the triage vital signs and the nursing notes.  Pertinent labs & imaging results that were available during my care of the patient were reviewed by me and considered in my medical decision making (see chart for details).     #Sinusitis #Wheezing #History of HIV infection #Chronic kidney disease Patient is a 34 year old HIV patient with chronic kidney disease presenting with sinusitis and what appears to be adult onset reactive airway disease.  No history of asthma however wheezing on exam today, and asthma like symptoms at night.  With normal heart size on chest xray, no peripheral edema, doubt cardiac etiology such as CHF. No pneumonia on xray.    - Given he has had 2 weeks of cough with sinus congestion, likely postnasal drip is driving some of this cough.  We will treat him with doxycycline as we do not know his current renal functions, last creatinine 1.98 on 01/23/2019.  Will prescribe albuterol inhaler and short course of prednisone for wheezing.  Tessalon for cough, Flonase and nasal saline as well.  Instructed patient to follow-up with [facility] to establish primary care.  -Also per chart review patient was called in March by his infectious disease clinic.  He was not reached for viral load check.  Instructed patient to follow-up with his infectious disease office tomorrow.  - We will also send a COVID-19 test. -  Strict return emergency department precautions were discussed. Final Clinical Impressions(s) / UC Diagnoses   Final diagnoses:  Wheezing  Sinusitis, unspecified chronicity, unspecified  location  History of HIV infection ([place])  Chronic kidney disease, unspecified CKD stage     Discharge Instructions     Take the doxycycline twice a day for 7 days Use the  albuterol as needed for wheezing and shortness of breath Take the prednisone in the morning for 3 days Use the Tessalon for cough Use the Flonase nasal spray daily Use the nasal saline throughout the day to help clear sinuses  Call the [facility] health internal medicine center to establish care with a primary care doctor  I would also like for you to call your infectious disease doctor as it appears they were trying to contact you previously.  If you have worsening shortness of breath, chest pain high fever or worsening productive cough please report to the emergency department for evaluation  If your Covid-19 test is positive, you will receive a phone call from [facility] regarding your results. Negative test results are not called. Both positive and negative results area always visible on MyChart. If you do not have a MyChart account, sign up instructions are in your discharge papers.   Persons who are directed to care for themselves at home may discontinue isolation under the following conditions:  . At least 10 days have passed since symptom onset and . At least 24 hours have passed without running a fever (this means without the use of fever-reducing medications) and . Other symptoms have improved.  Persons infected with COVID-19 who never develop symptoms may discontinue isolation and other precautions 10 days after the date of their first positive COVID-19 test.      ED Prescriptions    Medication Sig Dispense Auth. Provider   fluticasone (FLONASE) 50 MCG/ACT nasal spray Place 1 spray into both nostrils daily. 15.8 mL Raynesha Tiedt, Marguerita Beards, PA-C   predniSONE (DELTASONE) 10 MG tablet Take 4 tablets (40 mg total) by mouth daily with breakfast for 3 days. 12 tablet Adisa Vigeant, Marguerita Beards, PA-C   albuterol (VENTOLIN HFA) 108 (90 Base) MCG/ACT inhaler Inhale 1-2 puffs into the lungs every 6 (six) hours as needed for wheezing or shortness of breath. 8 g Mackenzy Eisenberg, Marguerita Beards, PA-C   doxycycline (VIBRAMYCIN)  100 MG capsule Take 1 capsule (100 mg total) by mouth 2 (two) times daily for 7 days. 14 capsule Tykeisha Peer, Marguerita Beards, PA-C   sodium chloride (OCEAN) 0.65 % SOLN nasal spray Place 1 spray into both nostrils as needed for congestion. 60 mL Sarahi Borland, Marguerita Beards, PA-C   benzonatate (TESSALON) 100 MG capsule Take 1 capsule (100 mg total) by mouth every 8 (eight) hours. 21 capsule Jovonni Borquez, Marguerita Beards, PA-C     PDMP not reviewed this encounter.   Purnell Shoemaker, PA-C 10/29/19 2046    Adria Costley, Marguerita Beards, PA-C 10/29/19 2254

## 2019-10-29 NOTE — ED Notes (Signed)
covid swab obtained for send out

## 2019-10-29 NOTE — Discharge Instructions (Signed)
Take the doxycycline twice a day for 7 days Use the albuterol as needed for wheezing and shortness of breath Take the prednisone in the morning for 3 days Use the Tessalon for cough Use the Flonase nasal spray daily Use the nasal saline throughout the day to help clear sinuses  Call the [facility] health internal medicine center to establish care with a primary care doctor  I would also like for you to call your infectious disease doctor as it appears they were trying to contact you previously.  If you have worsening shortness of breath, chest pain high fever or worsening productive cough please report to the emergency department for evaluation  If your Covid-19 test is positive, you will receive a phone call from [facility] regarding your results. Negative test results are not called. Both positive and negative results area always visible on MyChart. If you do not have a MyChart account, sign up instructions are in your discharge papers.   Persons who are directed to care for themselves at home may discontinue isolation under the following conditions:   At least 10 days have passed since symptom onset and  At least 24 hours have passed without running a fever (this means without the use of fever-reducing medications) and  Other symptoms have improved.  Persons infected with COVID-19 who never develop symptoms may discontinue isolation and other precautions 10 days after the date of their first positive COVID-19 test.

## 2019-10-29 NOTE — ED Triage Notes (Signed)
2 weeks ago started with chest congestion, sob, particularly difficult to sleep at night.  Denies fever, denies chills.    Patient has not been vaccinated

## 2019-10-30 LAB — SARS CORONAVIRUS 2 (TAT 6-24 HRS): SARS Coronavirus 2: NEGATIVE

## 2019-11-17 ENCOUNTER — Other Ambulatory Visit: Payer: Self-pay

## 2019-11-17 ENCOUNTER — Ambulatory Visit (HOSPITAL_COMMUNITY)
Admission: EM | Admit: 2019-11-17 | Discharge: 2019-11-17 | Disposition: A | Discharge status: Home / Self Care | Payer: Self-pay | Attending: Family Medicine | Admitting: Family Medicine

## 2019-11-17 ENCOUNTER — Encounter (HOSPITAL_COMMUNITY): Payer: Self-pay

## 2019-11-17 DIAGNOSIS — M25512 Pain in left shoulder: Secondary | ICD-10-CM

## 2019-11-17 NOTE — ED Triage Notes (Signed)
Pt c/o 10/10 sharp pain in left shoulderx1 wk. Pt denies injury. Pt unable to lift arm up all the way.

## 2019-11-17 NOTE — ED Provider Notes (Signed)
[place]    CSN: [id number] Arrival date & time: 11/17/19  1715      History   Chief Complaint Chief Complaint  Patient presents with  . Shoulder Pain    HPI Jonathon Moore is a 34 y.o. male.   He is presenting with left shoulder pain.  His symptoms started recently.  He started noticing a enlargement in the anterior aspect of the shoulder.  No radiation.  No trauma.  No history of dislocation.  No prior surgery.  Symptoms are intermittent and severe.  HPI  Past Medical History:  Diagnosis Date  . Atypical chest pain 02/24/2016  . Bronchitis   . CKD (chronic kidney disease) stage 5, GFR less than 15 ml/min (HCC) 02/11/2016  . Hemorrhoids   . History of syphilis   . HIV (human immunodeficiency virus infection) ([place])   . HTN (hypertension) 11/09/2017  . Lower back pain 02/24/2016  . 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 Poor compliance 02/09/2017    Patient Active Problem List   Diagnosis Date Noted  . 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 HTN (hypertension) 11/09/2017  . Herpes infection 07/13/2017  . Poor compliance 02/09/2017  . Atypical chest pain 02/24/2016  . Lower back pain 02/24/2016  . CKD (chronic kidney disease) stage 5, GFR less than 15 ml/min (HCC) 02/11/2016  . Cough   . Headache   . History of syphilis   . AKI (acute kidney injury) ([place])   . AIDS (acquired immune deficiency syndrome) ([place])   . 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 Depression   . Normocytic anemia 01/04/2016  . Poor dentition 01/04/2016  . Acute renal failure ([place]) 01/03/2016  . Metabolic acidosis 16/60/6301  . Diarrhea 01/03/2016  . DOE (dyspnea on exertion)     Past Surgical History:  Procedure Laterality Date  . BASCILIC VEIN TRANSPOSITION Left 01/12/2016   Procedure: LEFT ARM FIRST STAGE [place];  Surgeon: Conrad Metamora, MD;  Location: [facility];  Service: Vascular;  Laterality: Left;  . IR GENERIC HISTORICAL  01/09/2016   IR US GUIDE VASC ACCESS RIGHT  01/09/2016 Marybelle Killings, MD [facility]  . IR GENERIC HISTORICAL  01/09/2016   IR FLUORO GUIDE CV LINE RIGHT 01/09/2016 Marybelle Killings, MD [facility]       Home Medications    Prior to Admission medications   Medication Sig Start Date End Date Taking? Authorizing Provider  acetaminophen (TYLENOL) 500 MG tablet Take 500-1,000 mg by mouth every 6 (six) hours as needed (pain).     [provider]  albuterol (VENTOLIN HFA) 108 (90 Base) MCG/ACT inhaler Inhale 1-2 puffs into the lungs every 6 (six) hours as needed for wheezing or shortness of breath. 10/29/19   Darr, Marguerita Beards, PA-C  amLODipine (NORVASC) 5 MG tablet Take 1 tablet (5 mg total) by mouth at bedtime. 11/09/17   Truman Hayward, MD  benzonatate (TESSALON) 100 MG capsule Take 1 capsule (100 mg total) by mouth every 8 (eight) hours. 10/29/19   Darr, Marguerita Beards, PA-C  bictegravir-emtricitabine-tenofovir AF (BIKTARVY) 50-200-25 MG TABS tablet Take 1 tablet by mouth daily. 12/05/18   Kuppelweiser, Cassie L, RPH-CPP  darunavir-cobicistat (PREZCOBIX) 800-150 MG tablet Take 1 tablet by mouth daily with breakfast. 12/05/18   Kuppelweiser, Cassie L, RPH-CPP  fluticasone (FLONASE) 50 MCG/ACT nasal spray Place 1 spray into both nostrils daily. 10/29/19   Darr, Marguerita Beards, PA-C  oxyCODONE (ROXICODONE) 5 MG immediate release tablet Take 0.5-1 tablets (2.5-5 mg total) by mouth  every 6 (six) hours as needed for severe pain. Patient not taking: Reported on 02/13/2019 01/24/19   Arthor Captain, PA-C  sodium chloride (OCEAN) 0.65 % SOLN nasal spray Place 1 spray into both nostrils as needed for congestion. 10/29/19   Darr, Veryl Speak, PA-C  triamcinolone cream (KENALOG) 0.1 % Apply 1 application topically 2 (two) times daily. 12/05/18   Kuppelweiser, Cassie L, RPH-CPP  valACYclovir (VALTREX) 1000 MG tablet Take 1 tablet (1,000 mg total) by mouth 2 (two) times daily. Patient not taking: Reported on 01/23/2019 08/17/17   Kuppelweiser, Cherylann Ratel, RPH-CPP    Family  History Family History  Problem Relation Age of Onset  . Healthy Mother     Social History Social History   Tobacco Use  . Smoking status: Former Smoker    Packs/day: 0.20  . Smokeless tobacco: Never Used  Vaping Use  . Vaping Use: Never used  Substance Use Topics  . Alcohol use: Yes    Comment: rarely  . Drug use: Yes    Types: Marijuana     Allergies   Patient has no known allergies.   Review of Systems Review of Systems  See HPI  Physical Exam Triage Vital Signs ED Triage Vitals  Enc Vitals Group     BP 11/17/19 1739 (!) 175/106     Pulse Rate 11/17/19 1739 78     Resp 11/17/19 1739 16     Temp 11/17/19 1739 98.3 F (36.8 C)     Temp Source 11/17/19 1739 Oral     SpO2 11/17/19 1739 100 %     Weight 11/17/19 1742 200 lb (90.7 kg)     Height 11/17/19 1742 6\' 1"  (1.854 m)     Head Circumference --      Peak Flow --      Pain Score 11/17/19 1742 10     Pain Loc --      Pain Edu? --      Excl. in [place]? --    No data found.  Updated Vital Signs BP (!) 175/106   Pulse 78   Temp 98.3 F (36.8 C) (Oral)   Resp 16   Ht 6\' 1"  (1.854 m)   Wt 90.7 kg   SpO2 100%   BMI 26.39 kg/m   Visual Acuity Right Eye Distance:   Left Eye Distance:   Bilateral Distance:    Right Eye Near:   Left Eye Near:    Bilateral Near:     Physical Exam Gen: NAD, alert, cooperative with exam, well-appearing ENT: normal lips, normal nasal mucosa,  Eye: normal EOM, normal conjunctiva and lids Neuro: normal tone, normal sensation to touch Psych:  normal insight, alert and oriented MSK:  Left shoulder: Normal internal and external rotation. Palpable mass on the anterior aspect of the shoulder. Normal strength with empty can testing. Pain with O'Brien's test. Neurovascular intact   UC Treatments / Results  Labs (all labs ordered are listed, but only abnormal results are displayed) Labs Reviewed - No data to display  EKG   Radiology No results  found.  Procedures Procedures (including critical care time)  Medications Ordered in UC Medications - No data to display  Initial Impression / Assessment and Plan / UC Course  I have reviewed the triage vital signs and the nursing notes.  Pertinent labs & imaging results that were available during my care of the patient were reviewed by me and considered in my medical decision making (see chart for details).  Mr. Russomanno is a 34 year old male that is presenting with left shoulder pain.  Palpable mass is occurring over the anterior shoulder.  This is possible fatty infiltration into the subscapularis.  Counseled on supportive care.  Give indications to follow-up.  Final Clinical Impressions(s) / UC Diagnoses   Final diagnoses:  Acute pain of left shoulder     Discharge Instructions     Please try ice  Please follow up on Monday.     ED Prescriptions    None     PDMP not reviewed this encounter.   Rosemarie Ax, MD 11/17/19 [phone]

## 2019-11-17 NOTE — Discharge Instructions (Signed)
Please try ice  Please follow up on Monday.

## 2019-11-21 ENCOUNTER — Ambulatory Visit (HOSPITAL_COMMUNITY)
Admission: EM | Admit: 2019-11-21 | Discharge: 2019-11-21 | Disposition: A | Discharge status: Home / Self Care | Payer: Self-pay | Attending: Internal Medicine | Admitting: Internal Medicine

## 2019-11-21 ENCOUNTER — Encounter (HOSPITAL_COMMUNITY): Payer: Self-pay

## 2019-11-21 ENCOUNTER — Ambulatory Visit (INDEPENDENT_AMBULATORY_CARE_PROVIDER_SITE_OTHER): Payer: Self-pay

## 2019-11-21 ENCOUNTER — Other Ambulatory Visit: Payer: Self-pay

## 2019-11-21 DIAGNOSIS — Z8709 Personal history of other diseases of the respiratory system: Secondary | ICD-10-CM

## 2019-11-21 DIAGNOSIS — R0602 Shortness of breath: Secondary | ICD-10-CM

## 2019-11-21 DIAGNOSIS — J4521 Mild intermittent asthma with (acute) exacerbation: Secondary | ICD-10-CM

## 2019-11-21 HISTORY — DX: Unspecified asthma, uncomplicated: J45.909

## 2019-11-21 MED ORDER — DEXAMETHASONE SODIUM PHOSPHATE 10 MG/ML IJ SOLN
10.0000 mg | Freq: Once | INTRAMUSCULAR | Status: AC
  Administered 2019-11-21: 10 mg via INTRAMUSCULAR

## 2019-11-21 MED ORDER — PREDNISONE 20 MG PO TABS: 20.0000 mg | ORAL_TABLET | Freq: Every day | ORAL | 0 refills | Status: AC

## 2019-11-21 MED ORDER — ALBUTEROL SULFATE HFA 108 (90 BASE) MCG/ACT IN AERS
2.0000 | INHALATION_SPRAY | Freq: Once | RESPIRATORY_TRACT | Status: AC
  Administered 2019-11-21: 2 via RESPIRATORY_TRACT

## 2019-11-21 MED ORDER — AEROCHAMBER PLUS FLO-VU LARGE MISC
Status: AC
  Filled 2019-11-21: qty 1

## 2019-11-21 MED ORDER — ALBUTEROL SULFATE HFA 108 (90 BASE) MCG/ACT IN AERS: 1.0000 | INHALATION_SPRAY | Freq: Four times a day (QID) | RESPIRATORY_TRACT | 1 refills | Status: AC | PRN

## 2019-11-21 MED ORDER — BENZONATATE 100 MG PO CAPS
100.0000 mg | ORAL_CAPSULE | Freq: Three times a day (TID) | ORAL | 0 refills | Status: AC
Start: 2019-11-21 — End: ?

## 2019-11-21 MED ORDER — DEXAMETHASONE SODIUM PHOSPHATE 10 MG/ML IJ SOLN
INTRAMUSCULAR | Status: AC
  Filled 2019-11-21: qty 1

## 2019-11-21 MED ORDER — ALBUTEROL SULFATE HFA 108 (90 BASE) MCG/ACT IN AERS
INHALATION_SPRAY | RESPIRATORY_TRACT | Status: AC
  Filled 2019-11-21: qty 6.7

## 2019-11-21 MED ORDER — AEROCHAMBER PLUS FLO-VU LARGE MISC
1.0000 | Freq: Once | Status: AC
  Administered 2019-11-21: 1

## 2019-11-21 NOTE — ED Triage Notes (Signed)
Eval in triage revealed wheezes, diminished lung sounds bilaterally. Dr. Lanny Cramp gave verbal orders for Decadron, albuterol/spacer. Pt direct to room 7 for med admin, further eval.

## 2019-11-21 NOTE — ED Provider Notes (Signed)
[place]    CSN: [id number] Arrival date & time: 11/21/19  0850      History   Chief Complaint Chief Complaint  Patient presents with  . Shortness of Breath    HPI Jonathon Moore is a 34 y.o. male comes to urgent care with fairly sudden onset shortness of breath, cough and wheezing which started this morning after he woke up.  Patient denies any chest pain or chest pressure.  No fever or chills.  No sputum production.  Patient was in his usual state of health before he went to bed yesterday.  No unusual food or diet.  Patient has wheezing.  He has a history of asthma but has run out of albuterol.  HPI  Past Medical History:  Diagnosis Date  . Asthma   . Atypical chest pain 02/24/2016  . Bronchitis   . CKD (chronic kidney disease) stage 5, GFR less than 15 ml/min (HCC) 02/11/2016  . Hemorrhoids   . History of syphilis   . HIV (human immunodeficiency virus infection) ([place])   . HTN (hypertension) 11/09/2017  . Lower back pain 02/24/2016  . 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 Poor compliance 02/09/2017    Patient Active Problem List   Diagnosis Date Noted  . 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 HTN (hypertension) 11/09/2017  . Herpes infection 07/13/2017  . Poor compliance 02/09/2017  . Atypical chest pain 02/24/2016  . Lower back pain 02/24/2016  . CKD (chronic kidney disease) stage 5, GFR less than 15 ml/min (HCC) 02/11/2016  . Cough   . Headache   . History of syphilis   . AKI (acute kidney injury) ([place])   . AIDS (acquired immune deficiency syndrome) ([place])   . 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 Depression   . Normocytic anemia 01/04/2016  . Poor dentition 01/04/2016  . Acute renal failure ([place]) 01/03/2016  . Metabolic acidosis 24/40/1027  . Diarrhea 01/03/2016  . DOE (dyspnea on exertion)     Past Surgical History:  Procedure Laterality Date  . BASCILIC VEIN TRANSPOSITION Left 01/12/2016   Procedure: LEFT ARM FIRST STAGE [place];  Surgeon: Conrad Dutton, MD;  Location: [facility];  Service: Vascular;  Laterality: Left;  . IR GENERIC HISTORICAL  01/09/2016   IR US GUIDE VASC ACCESS RIGHT 01/09/2016 Marybelle Killings, MD [facility]  . IR GENERIC HISTORICAL  01/09/2016   IR FLUORO GUIDE CV LINE RIGHT 01/09/2016 Marybelle Killings, MD [facility]       Home Medications    Prior to Admission medications   Medication Sig Start Date End Date Taking? Authorizing Provider  amLODipine (NORVASC) 5 MG tablet Take 1 tablet (5 mg total) by mouth at bedtime. 11/09/17  Yes Tommy Medal, Lavell Islam, MD  bictegravir-emtricitabine-tenofovir AF (BIKTARVY) 50-200-25 MG TABS tablet Take 1 tablet by mouth daily. 12/05/18  Yes Kuppelweiser, Cassie L, RPH-CPP  darunavir-cobicistat (PREZCOBIX) 800-150 MG tablet Take 1 tablet by mouth daily with breakfast. 12/05/18  Yes Kuppelweiser, Cassie L, RPH-CPP  fluticasone (FLONASE) 50 MCG/ACT nasal spray Place 1 spray into both nostrils daily. 10/29/19  Yes Darr, Marguerita Beards, PA-C  acetaminophen (TYLENOL) 500 MG tablet Take 500-1,000 mg by mouth every 6 (six) hours as needed (pain).     [provider]  albuterol (VENTOLIN HFA) 108 (90 Base) MCG/ACT inhaler Inhale 1-2 puffs into the lungs every 6 (six) hours as needed for wheezing or shortness of breath. 11/21/19   Zainah Steven, Myrene Galas, MD  benzonatate (TESSALON) 100 MG capsule  Take 1 capsule (100 mg total) by mouth every 8 (eight) hours. 11/21/19   Sreshta Cressler, Myrene Galas, MD  predniSONE (DELTASONE) 20 MG tablet Take 1 tablet (20 mg total) by mouth daily for 5 days. 11/21/19 11/26/19  Chase Picket, MD  sodium chloride (OCEAN) 0.65 % SOLN nasal spray Place 1 spray into both nostrils as needed for congestion. 10/29/19   Darr, Marguerita Beards, PA-C  triamcinolone cream (KENALOG) 0.1 % Apply 1 application topically 2 (two) times daily. 12/05/18   Kuppelweiser, Cassie L, RPH-CPP    Family History Family History  Problem Relation Age of Onset  . Healthy Mother     Social  History Social History   Tobacco Use  . Smoking status: Former Smoker    Packs/day: 0.20  . Smokeless tobacco: Never Used  Vaping Use  . Vaping Use: Never used  Substance Use Topics  . Alcohol use: Yes    Comment: rarely  . Drug use: Yes    Types: Marijuana     Allergies   Patient has no known allergies.   Review of Systems Review of Systems  Constitutional: Positive for activity change. Negative for fatigue.  HENT: Negative.  Negative for sore throat and voice change.   Respiratory: Positive for cough, chest tightness, shortness of breath and wheezing.   Cardiovascular: Negative for chest pain and palpitations.  Gastrointestinal: Negative.   Genitourinary: Negative.   Musculoskeletal: Negative.   Neurological: Negative for dizziness, light-headedness and headaches.  Psychiatric/Behavioral: Negative.      Physical Exam Triage Vital Signs ED Triage Vitals  Enc Vitals Group     BP 11/21/19 0858 (!) 159/119     Pulse Rate 11/21/19 0858 78     Resp 11/21/19 0858 (!) 28     Temp 11/21/19 0858 98 F (36.7 C)     Temp Source 11/21/19 0858 Oral     SpO2 11/21/19 0858 95 %     Weight --      Height --      Head Circumference --      Peak Flow --      Pain Score 11/21/19 0914 0     Pain Loc --      Pain Edu? --      Excl. in [place]? --    No data found.  Updated Vital Signs BP (!) 159/119 (BP Location: Right Arm)   Pulse 78   Temp 98 F (36.7 C) (Oral)   Resp (!) 28   SpO2 95%   Visual Acuity Right Eye Distance:   Left Eye Distance:   Bilateral Distance:    Right Eye Near:   Left Eye Near:    Bilateral Near:     Physical Exam Vitals and nursing note reviewed.  Constitutional:      General: He is in acute distress.     Comments: Patient is speaking in short sentences.  Cardiovascular:     Rate and Rhythm: Normal rate and regular rhythm.     Pulses: Normal pulses.     Heart sounds: Normal heart sounds.  Pulmonary:     Breath sounds: Examination of  the right-upper field reveals decreased breath sounds and wheezing. Examination of the left-upper field reveals decreased breath sounds and wheezing. Examination of the right-middle field reveals decreased breath sounds and wheezing. Examination of the left-middle field reveals decreased breath sounds and wheezing. Examination of the right-lower field reveals decreased breath sounds and wheezing. Examination of the left-lower field reveals decreased breath sounds  and wheezing. Decreased breath sounds and wheezing present. No rhonchi or rales.  Chest:     Chest wall: No mass, tenderness or crepitus.  Abdominal:     General: Bowel sounds are normal.     Palpations: Abdomen is soft. There is no hepatomegaly or splenomegaly.  Musculoskeletal:     Cervical back: Normal range of motion.  Skin:    Capillary Refill: Capillary refill takes less than 2 seconds.  Neurological:     Mental Status: He is alert.      UC Treatments / Results  Labs (all labs ordered are listed, but only abnormal results are displayed) Labs Reviewed - No data to display  EKG   Radiology No results found.  Procedures Procedures (including critical care time)  Medications Ordered in UC Medications  albuterol (VENTOLIN HFA) 108 (90 Base) MCG/ACT inhaler 2 puff (2 puffs Inhalation Given 11/21/19 0908)  AeroChamber Plus Flo-Vu Large MISC 1 each (1 each Other Given 11/21/19 0908)  dexamethasone (DECADRON) injection 10 mg (10 mg Intramuscular Given 11/21/19 0908)    Initial Impression / Assessment and Plan / UC Course  I have reviewed the triage vital signs and the nursing notes.  Pertinent labs & imaging results that were available during my care of the patient were reviewed by me and considered in my medical decision making (see chart for details).     1.  Mild intermittent asthma with moderately severe acute exacerbation: Dexamethasone 10 mg IM x1 dose Albuterol inhaler 2 puffs x 1 Chest x-ray is negative for  acute lung infiltrate.  Chest x-ray was independently reviewed by me Prednisone 20 mg orally daily x5 days Prescribed albuterol 2 puffs every 4-6 hours as needed. Patient had Covid PCR test within the last week or so Return precautions given Final Clinical Impressions(s) / UC Diagnoses   Final diagnoses:  Mild intermittent asthma with (acute) exacerbation   Discharge Instructions   None    ED Prescriptions    Medication Sig Dispense Auth. Provider   albuterol (VENTOLIN HFA) 108 (90 Base) MCG/ACT inhaler Inhale 1-2 puffs into the lungs every 6 (six) hours as needed for wheezing or shortness of breath. 18 g Carline Dura, Myrene Galas, MD   benzonatate (TESSALON) 100 MG capsule Take 1 capsule (100 mg total) by mouth every 8 (eight) hours. 21 capsule Aleric Froelich, Myrene Galas, MD   predniSONE (DELTASONE) 20 MG tablet Take 1 tablet (20 mg total) by mouth daily for 5 days. 5 tablet [place] Silveria, Myrene Galas, MD     PDMP not reviewed this encounter.   Chase Picket, MD 11/21/19 1044

## 2019-11-21 NOTE — ED Triage Notes (Signed)
Pt c/o SOB for past two days; states out of albuterol inhaler for several weeks. Denies other illness, fever, chills, URI sx. Pt with substernal retractions, RR 28 bpm;  Pt has difficulty speaking short sentences. Dr. Lanny Cramp informed of pt status and Dr. Lanny Cramp in for

## 2019-11-21 NOTE — ED Notes (Signed)
Pt reports WOB slightly improved after inhaler admin. Retractions slightly improved; RR 22; still has difficulty speaking full sentences.  SpO2 95% on Ra.

## 2019-11-23 ENCOUNTER — Ambulatory Visit: Payer: Self-pay | Admitting: Family Medicine

## 2019-12-30 ENCOUNTER — Encounter (HOSPITAL_COMMUNITY): Payer: Self-pay

## 2019-12-30 ENCOUNTER — Emergency Department (HOSPITAL_COMMUNITY)
Admission: EM | Admit: 2019-12-30 | Discharge: 2019-12-31 | Disposition: A | Discharge status: Home / Self Care | Payer: HRSA Program | Attending: Emergency Medicine | Admitting: Emergency Medicine

## 2019-12-30 DIAGNOSIS — Z20822 Contact with and (suspected) exposure to covid-19: Secondary | ICD-10-CM | POA: Diagnosis not present

## 2019-12-30 DIAGNOSIS — B20 Human immunodeficiency virus [HIV] disease: Secondary | ICD-10-CM | POA: Diagnosis not present

## 2019-12-30 DIAGNOSIS — R0602 Shortness of breath: Secondary | ICD-10-CM | POA: Insufficient documentation

## 2019-12-30 DIAGNOSIS — Z7951 Long term (current) use of inhaled steroids: Secondary | ICD-10-CM | POA: Insufficient documentation

## 2019-12-30 DIAGNOSIS — Z79899 Other long term (current) drug therapy: Secondary | ICD-10-CM | POA: Diagnosis not present

## 2019-12-30 DIAGNOSIS — N185 Chronic kidney disease, stage 5: Secondary | ICD-10-CM | POA: Insufficient documentation

## 2019-12-30 DIAGNOSIS — J189 Pneumonia, unspecified organism: Secondary | ICD-10-CM | POA: Insufficient documentation

## 2019-12-30 DIAGNOSIS — R6883 Chills (without fever): Secondary | ICD-10-CM | POA: Diagnosis present

## 2019-12-30 DIAGNOSIS — R059 Cough, unspecified: Secondary | ICD-10-CM

## 2019-12-30 DIAGNOSIS — J45909 Unspecified asthma, uncomplicated: Secondary | ICD-10-CM | POA: Diagnosis not present

## 2019-12-30 DIAGNOSIS — R05 Cough: Secondary | ICD-10-CM | POA: Insufficient documentation

## 2019-12-30 DIAGNOSIS — I120 Hypertensive chronic kidney disease with stage 5 chronic kidney disease or end stage renal disease: Secondary | ICD-10-CM | POA: Diagnosis not present

## 2019-12-30 NOTE — ED Triage Notes (Signed)
Pt arrives POV for eval of chills and congestion. Pt reports he was around someone who was positive for covid and wants to be tested.

## 2019-12-31 ENCOUNTER — Emergency Department (HOSPITAL_COMMUNITY): Payer: HRSA Program

## 2019-12-31 LAB — CBC WITH DIFFERENTIAL/PLATELET
Abs Immature Granulocytes: 0.04 10*3/uL (ref 0.00–0.07)
Basophils Absolute: 0 10*3/uL (ref 0.0–0.1)
Basophils Relative: 0 %
Eosinophils Absolute: 0.2 10*3/uL (ref 0.0–0.5)
Eosinophils Relative: 2 %
HCT: 40.3 % (ref 39.0–52.0)
Hemoglobin: 13 g/dL (ref 13.0–17.0)
Immature Granulocytes: 1 %
Lymphocytes Relative: 32 %
Lymphs Abs: 2.5 10*3/uL (ref 0.7–4.0)
MCH: 30.2 pg (ref 26.0–34.0)
MCHC: 32.3 g/dL (ref 30.0–36.0)
MCV: 93.7 fL (ref 80.0–100.0)
Monocytes Absolute: 0.8 10*3/uL (ref 0.1–1.0)
Monocytes Relative: 11 %
Neutro Abs: 4.1 10*3/uL (ref 1.7–7.7)
Neutrophils Relative %: 54 %
Platelets: 249 10*3/uL (ref 150–400)
RBC: 4.3 MIL/uL (ref 4.22–5.81)
RDW: 14.6 % (ref 11.5–15.5)
WBC: 7.6 10*3/uL (ref 4.0–10.5)
nRBC: 0 % (ref 0.0–0.2)

## 2019-12-31 LAB — COMPREHENSIVE METABOLIC PANEL
ALT: 20 U/L (ref 0–44)
AST: 23 U/L (ref 15–41)
Albumin: 2.5 g/dL — ABNORMAL LOW (ref 3.5–5.0)
Alkaline Phosphatase: 73 U/L (ref 38–126)
Anion gap: 10 (ref 5–15)
BUN: 15 mg/dL (ref 6–20)
CO2: 22 mmol/L (ref 22–32)
Calcium: 8.1 mg/dL — ABNORMAL LOW (ref 8.9–10.3)
Chloride: 106 mmol/L (ref 98–111)
Creatinine, Ser: 2.79 mg/dL — ABNORMAL HIGH (ref 0.61–1.24)
GFR calc Af Amer: 33 mL/min — ABNORMAL LOW (ref >60)
GFR calc non Af Amer: 29 mL/min — ABNORMAL LOW (ref >60)
Glucose, Bld: 83 mg/dL (ref 70–99)
Potassium: 4.2 mmol/L (ref 3.5–5.1)
Sodium: 138 mmol/L (ref 135–145)
Total Bilirubin: 0.7 mg/dL (ref 0.3–1.2)
Total Protein: 7.4 g/dL (ref 6.5–8.1)

## 2019-12-31 LAB — LACTATE DEHYDROGENASE: LDH: 184 U/L (ref 98–192)

## 2019-12-31 LAB — SARS CORONAVIRUS 2 BY RT PCR (HOSPITAL ORDER, PERFORMED IN ~~LOC~~ HOSPITAL LAB): SARS Coronavirus 2: NEGATIVE

## 2019-12-31 MED ORDER — SULFAMETHOXAZOLE-TRIMETHOPRIM 800-160 MG PO TABS: 1.0000 | ORAL_TABLET | Freq: Two times a day (BID) | ORAL | 0 refills | Status: AC

## 2019-12-31 MED ORDER — DOXYCYCLINE HYCLATE 100 MG PO CAPS
100.0000 mg | ORAL_CAPSULE | Freq: Two times a day (BID) | ORAL | 0 refills | Status: AC
Start: 2019-12-31 — End: ?

## 2019-12-31 MED ORDER — BIKTARVY 50-200-25 MG PO TABS
1.0000 | ORAL_TABLET | Freq: Every day | ORAL | 3 refills | Status: AC
Start: 2019-12-31 — End: ?

## 2019-12-31 MED ORDER — PREZCOBIX 800-150 MG PO TABS: 1.0000 | ORAL_TABLET | Freq: Every day | ORAL | 3 refills | Status: AC

## 2019-12-31 MED ORDER — ALBUTEROL SULFATE HFA 108 (90 BASE) MCG/ACT IN AERS
4.0000 | INHALATION_SPRAY | Freq: Once | RESPIRATORY_TRACT | Status: AC
  Administered 2019-12-31: 4 via RESPIRATORY_TRACT
  Filled 2019-12-31: qty 6.7

## 2019-12-31 NOTE — Discharge Instructions (Addendum)
Your Covid test today was negative.  However your x-ray showed evidence of a pneumonia.  You were sent to different antibiotics, please take them as directed until finished.  This is very important in order to prevent antibiotic resistance and worsening infection.  I have also refilled your antiviral medication, however you MUST follow-up with your infectious disease doctor Dr. Tommy Medal this week for follow-up.  Your blood cultures here are pending, if they are positive, you must return to the ER for further evaluation and treatment.  Return to the ER if you have any worsening shortness of breath, chest pain, worsening fevers or chills.

## 2019-12-31 NOTE — ED Provider Notes (Addendum)
[facility] EMERGENCY DEPARTMENT Provider Note   CSN: [id number] Arrival date & time: 12/30/19  2147     History Chief Complaint  Patient presents with  . Chills    Jonathon Moore is a 34 y.o. male.  HPI 34 year old male with a history of asthma, CKD stage V, HIV, syphilis, history of neurosyphilis, presents to the ER with several days of generalized body aches, cough, mild shortness of breath and recently being around a friend who tested positive for Covid recently.  He is here requesting Covid testing.  He endorses some chills, but does not have a thermometer and cannot measure a fever.  He is not vaccinated.  Denies any chest pain, abdominal pain, nausea, vomiting diarrhea, dysuria.  Reports noncompliance with his medication for his HIV for several months.  He states that he ran out and never had it refilled.  He is followed by Dr. Tommy Medal with infectious disease.  Past Medical History:  Diagnosis Date  . Asthma   . Atypical chest pain 02/24/2016  . Bronchitis   . CKD (chronic kidney disease) stage 5, GFR less than 15 ml/min (HCC) 02/11/2016  . Hemorrhoids   . History of syphilis   . HIV (human immunodeficiency virus infection) ([place])   . HTN (hypertension) 11/09/2017  . Lower back pain 02/24/2016  . 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 Poor compliance 02/09/2017    Patient Active Problem List   Diagnosis Date Noted  . 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 HTN (hypertension) 11/09/2017  . Herpes infection 07/13/2017  . Poor compliance 02/09/2017  . Atypical chest pain 02/24/2016  . Lower back pain 02/24/2016  . CKD (chronic kidney disease) stage 5, GFR less than 15 ml/min (HCC) 02/11/2016  . Cough   . Headache   . History of syphilis   . AKI (acute kidney injury) ([place])   . AIDS (acquired immune deficiency syndrome) ([place])   . 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 Depression   . Normocytic anemia 01/04/2016  . Poor dentition 01/04/2016  . Acute renal  failure ([place]) 01/03/2016  . Metabolic acidosis 35/59/7416  . Diarrhea 01/03/2016  . DOE (dyspnea on exertion)     Past Surgical History:  Procedure Laterality Date  . BASCILIC VEIN TRANSPOSITION Left 01/12/2016   Procedure: LEFT ARM FIRST STAGE [place];  Surgeon: Conrad Windsor, MD;  Location: [facility];  Service: Vascular;  Laterality: Left;  . IR GENERIC HISTORICAL  01/09/2016   IR US GUIDE VASC ACCESS RIGHT 01/09/2016 Marybelle Killings, MD [facility]INTER[facility]  . IR GENERIC HISTORICAL  01/09/2016   IR FLUORO GUIDE CV LINE RIGHT 01/09/2016 Marybelle Killings, MD [facility]       Family History  Problem Relation Age of Onset  . Healthy Mother     Social History   Tobacco Use  . Smoking status: Former Smoker    Packs/day: 0.20  . Smokeless tobacco: Never Used  Vaping Use  . Vaping Use: Never used  Substance Use Topics  . Alcohol use: Yes    Comment: rarely  . Drug use: Yes    Types: Marijuana    Home Medications Prior to Admission medications   Medication Sig Start Date End Date Taking? Authorizing Provider  acetaminophen (TYLENOL) 500 MG tablet Take 500-1,000 mg by mouth every 6 (six) hours as needed (pain).     [provider]  albuterol (VENTOLIN HFA) 108 (90 Base) MCG/ACT inhaler Inhale 1-2 puffs into the lungs every 6 (six) hours as  needed for wheezing or shortness of breath. 11/21/19   Lamptey, Myrene Galas, MD  amLODipine (NORVASC) 5 MG tablet Take 1 tablet (5 mg total) by mouth at bedtime. 11/09/17   Truman Hayward, MD  benzonatate (TESSALON) 100 MG capsule Take 1 capsule (100 mg total) by mouth every 8 (eight) hours. 11/21/19   LampteyMyrene Galas, MD  bictegravir-emtricitabine-tenofovir AF (BIKTARVY) 50-200-25 MG TABS tablet Take 1 tablet by mouth daily. 12/31/19   Garald Balding, PA-C  darunavir-cobicistat (PREZCOBIX) 800-150 MG tablet Take 1 tablet by mouth daily with breakfast. 12/31/19   Garald Balding, PA-C  doxycycline (VIBRAMYCIN) 100 MG capsule Take 1  capsule (100 mg total) by mouth 2 (two) times daily. 12/31/19   Garald Balding, PA-C  fluticasone (FLONASE) 50 MCG/ACT nasal spray Place 1 spray into both nostrils daily. 10/29/19   Darr, Marguerita Beards, PA-C  sodium chloride (OCEAN) 0.65 % SOLN nasal spray Place 1 spray into both nostrils as needed for congestion. 10/29/19   Darr, Marguerita Beards, PA-C  sulfamethoxazole-trimethoprim (BACTRIM DS) 800-160 MG tablet Take 1 tablet by mouth every 12 (twelve) hours for 7 days. 12/31/19 01/07/20  Garald Balding, PA-C  triamcinolone cream (KENALOG) 0.1 % Apply 1 application topically 2 (two) times daily. 12/05/18   Kuppelweiser, Cassie L, RPH-CPP    Allergies    Patient has no known allergies.  Review of Systems   Review of Systems  Constitutional: Positive for chills. Negative for fever.  HENT: Negative for ear pain and sore throat.   Eyes: Negative for pain and visual disturbance.  Respiratory: Positive for cough and shortness of breath.   Cardiovascular: Negative for chest pain and palpitations.  Gastrointestinal: Negative for abdominal pain and vomiting.  Genitourinary: Negative for dysuria and hematuria.  Musculoskeletal: Negative for arthralgias and back pain.  Skin: Negative for color change and rash.  Neurological: Negative for seizures and syncope.  All other systems reviewed and are negative.   Physical Exam Updated Vital Signs BP (!) 147/87 (BP Location: Right Arm)   Pulse 74   Temp 99.1 F (37.3 C) (Oral)   Resp 14   Ht $R'6\' 1"'AM$  (1.854 m)   Wt 91 kg   SpO2 97%   BMI 26.47 kg/m   Physical Exam Vitals and nursing note reviewed.  Constitutional:      Appearance: Normal appearance. He is well-developed.  HENT:     Head: Normocephalic and atraumatic.  Eyes:     Conjunctiva/sclera: Conjunctivae normal.  Cardiovascular:     Rate and Rhythm: Normal rate and regular rhythm.     Heart sounds: No murmur heard.   Pulmonary:     Effort: Pulmonary effort is normal. No respiratory distress.      Breath sounds: Wheezing present.  Abdominal:     General: Abdomen is flat.     Palpations: Abdomen is soft.     Tenderness: There is no abdominal tenderness.  Musculoskeletal:        General: Normal range of motion.     Cervical back: Neck supple.  Skin:    General: Skin is warm and dry.  Neurological:     Mental Status: He is alert.     ED Results / Procedures / Treatments   Labs (all labs ordered are listed, but only abnormal results are displayed) Labs Reviewed  COMPREHENSIVE METABOLIC PANEL - Abnormal; Notable for the following components:      Result Value   Creatinine, Ser 2.79 (*)  Calcium 8.1 (*)    Albumin 2.5 (*)    GFR calc non Af Amer 29 (*)    GFR calc Af Amer 33 (*)    All other components within normal limits  SARS CORONAVIRUS 2 BY RT PCR (HOSPITAL ORDER, [place] [facility])  CULTURE, BLOOD (ROUTINE X 2)  CULTURE, BLOOD (ROUTINE X 2)  CBC WITH DIFFERENTIAL/PLATELET  LACTATE DEHYDROGENASE  T-HELPER CELLS (CD4) COUNT (NOT AT [facility])  HIV-1 RNA QUANT-NO REFLEX-BLD    EKG None  Radiology DG Chest Portable 1 View  Result Date: 12/31/2019 CLINICAL DATA:  Cough, chills and congestion. EXAM: PORTABLE CHEST 1 VIEW COMPARISON:  Chest x-ray 11/21/2019 FINDINGS: The cardiac silhouette, mediastinal and hilar contours are normal and stable. Patchy ill-defined interstitial type infiltrates are suspected in both lung bases. Findings could be due to atypical/viral pneumonia. No pleural effusions or pulmonary lesions. IMPRESSION: Bibasilar interstitial type infiltrates. Electronically Signed   By: Marijo Sanes M.D.   On: 12/31/2019 11:29    Procedures Procedures (including critical care time)  Medications Ordered in ED Medications  albuterol (VENTOLIN HFA) 108 (90 Base) MCG/ACT inhaler 4 puff (4 puffs Inhalation Given 12/31/19 1138)    ED Course  I have reviewed the triage vital signs and the nursing notes.  Pertinent labs & imaging results that  were available during my care of the patient were reviewed by me and considered in my medical decision making (see chart for details).  Clinical Course as of Dec 30 1449  Mon Dec 31, 2019  1136 Bactrim/ Doxy Pneumonia    [MB]  1400 34 yo male w/ poorly controlled HIV presenting to E dwith cough, fevers, congestion for 2 days.  He was worried about COVID exposure.  Covid test was negative in the Ed today.  However xray is consistent with atypical PNA presentation.  Labs showed normal LDH, WBC 7.6, and BMP near baseline, with some moderate worsening of his underlying kidney disease.  I think the patient is quite well appearing, healthy-appearing on exam.  He is not hypoxic or tachycardic during my exam.  I have a low overall suspicion he is bacteremic.  However he is likely immunosuppressed so I engaged in shared decision making regarding home mgmt vs observation in the hospital.  He wants to go home.  I think this is reasonable.  We can discharge him on bactrim + doxcycline double coverage for CAP and atypical PNA (inc PCP PNA), and pend his blood cultures.  I made it very clear he needs to have close f/u with his ID doctor and resume his HIV medications.  If his blood cultures are positive, I made clear he needs to return immediately to the ED for IV antibiotics.  We can provide him with a script for this.  He verbalized understanding of this plan.   [MT]    Clinical Course User Index [MB] Garald Balding, PA-C [MT] Wyvonnia Dusky, MD   MDM Rules/Calculators/A&P                         34 year old male with poorly controlled HIV presenting with cough, fevers, congestion for the last 2 days with concerns of Covid exposure.  On presentation, the patient is alert, oriented, nontoxic-appearing, in no acute respiratory distress, speaking full sentences without increased work of breathing.    VITALS: overall reassuring.  Temp of 99.1.  Not hypoxic or tachypneic. PE: scattered wheezing, soft  nontender abdomen.  LABS:  I personally reviewed and interpreted his lab work: CBC without leukocytosis, normal hemoglobin CMP with no significant electrolyte abnormalities, worsening renal function with creatinine of 2.79.  Per chart review, previous values were 11 months ago 1.98.  GFR of 29.  Normal liver function tests. Covid testing is negative. LDH is normal, not suggestive of pneumocystis pneumonia Pending blood cultures, CD4 count, RNA quantitative (will not return today)   IMAGING:  Chest x-ray with bilateral pulmonary infiltrates, consistent with an atypical/viral pneumonia.  CONSULTS : InBasket message to Dr. Tommy Medal about the patient's visit today.  Made aware of his presentation diagnosis.  Stressed that he follow-up with the patient and have him seen in office within the next few days.  TREATMENTS: Received 4 puffs of albuterol in the ED, wheezing improved   MDM: Patient was also seen and evaluated by Dr. Langston Masker.  Patient with a chest x-ray consistent with atypical pneumonia, and poorly controlled HIV.  Overall well-appearing, vital signs within normal limits, not tachycardic or hypoxic.  No evidence of sepsis.  Engaged in shared decision-making regarding home management or observation in the hospital, which patient elected that he wanted to go home.  Will discharge with Bactrim and doxy double coverage for CAP and atypical pneumonia, renally dosed.  As mentioned, message to Dr. Tommy Medal to alert him of the patient's status.  I refilled his home antivirals.  CD4 count, RNA quant and blood culture still pending.  Needs close follow-up with ID.  Strict return precautions given if his blood cultures come back positive which he will check via MyChart.  Return precautions also included worsening shortness of breath, chest pain, fevers, chills.  DISPOSITION: Bactrim + Doxy to cover for CAP and PCP pneumonia, close followup w/ ID and strict return precautions.     Final Clinical  Impression(s) / ED Diagnoses Final diagnoses:  Encounter for laboratory testing for COVID-19 virus  Cough  Atypical pneumonia    Rx / DC Orders ED Discharge Orders         Ordered    bictegravir-emtricitabine-tenofovir AF (BIKTARVY) 50-200-25 MG TABS tablet  Daily     Discontinue  Reprint     12/31/19 1138    darunavir-cobicistat (PREZCOBIX) 800-150 MG tablet  Daily with breakfast     Discontinue  Reprint     12/31/19 1138    sulfamethoxazole-trimethoprim (BACTRIM DS) 800-160 MG tablet  Every 12 hours     Discontinue  Reprint     12/31/19 1401    doxycycline (VIBRAMYCIN) 100 MG capsule  2 times daily     Discontinue  Reprint     12/31/19 1401              Lyndel Safe 12/31/19 1453    Wyvonnia Dusky, MD 12/31/19 1758

## 2020-01-01 ENCOUNTER — Telehealth: Payer: Self-pay | Admitting: *Deleted

## 2020-01-01 LAB — T-HELPER CELLS (CD4) COUNT (NOT AT ARMC)
CD4 % Helper T Cell: 9 % — ABNORMAL LOW (ref 33–65)
CD4 T Cell Abs: 177 /uL — ABNORMAL LOW (ref 400–1790)

## 2020-01-01 LAB — HIV-1 RNA QUANT-NO REFLEX-BLD
HIV 1 RNA Quant: 23100 copies/mL
LOG10 HIV-1 RNA: 4.364 log10copy/mL

## 2020-01-01 NOTE — Telephone Encounter (Signed)
Joycelyn Schmid spoke with patient, scheduled him 8/10 for financial counseling and 9/15 to follow up with Dr Tommy Medal.  Patient's ADAP expired 08/29/19.  Will 'cc Don and pharmacy team. Landis Gandy, RN

## 2020-01-01 NOTE — Telephone Encounter (Signed)
-----   Message from Truman Hayward, MD sent at 12/31/2019  6:09 PM EDT ----- Regarding: RE: ER VISIT Does [facility] have access to meds and can we get him appt at minium by phone w somone ----- Message ----- From: Garald Balding, PA-C Sent: 12/31/2019   2:47 PM EDT To: Truman Hayward, MD Subject: ER VISIT                                       Good afternoon Dr. Tommy Medal,  My name is Sharyn Lull,  I am a PA here in the [facility], ER.  I saw Mr. Caruth here today, he presented with body aches, cough, fevers and recent Covid exposure with request to get tested today.  He is not vaccinated.  His chest x-ray showed bilateral infiltrates consistent with an atypical pneumonia, however his Covid test was negative.  Upon further questioning, he admits to not taking his HIV medications for the last several months citing that he ran out and never had it refilled.  We have sent him home with Bactrim and doxy to cover him for CAP and PCP pneumonia. I have also refilled his antiviral medications. Ordered CD4 count, RNA quant, and blood cultures.  He was given strict return precautions, however needs very close follow-up.  We have directed him back to your office.  I was hoping you could reach out to him and make sure that he has the proper continuation of care that he needs.  Please let me know if you have any other questions.  Thank Nolberto Hanlon PA-C

## 2020-01-01 NOTE — Telephone Encounter (Signed)
Will see patient on 8/10. Will touch base with Inez Catalina to see if we can get a 30 day supply until ADAP gets approved .Marland Kitchen Approx 4 weeks before approval .

## 2020-01-02 NOTE — Telephone Encounter (Signed)
Let me know if I can do anything re helpin ghim get back on meds

## 2020-01-05 LAB — CULTURE, BLOOD (ROUTINE X 2)
Culture: NO GROWTH
Culture: NO GROWTH
Special Requests: ADEQUATE
Special Requests: ADEQUATE

## 2020-01-08 ENCOUNTER — Ambulatory Visit: Payer: Self-pay

## 2020-02-13 ENCOUNTER — Ambulatory Visit: Payer: Self-pay | Admitting: Infectious Disease
